# Patient Record
Sex: Male | Born: 1950 | Race: White | Hispanic: No | Marital: Married | Smoking: Former smoker
Health system: Southern US, Community
[De-identification: ages and names within clinical notes are randomized; demographics above are authoritative.]

## PROBLEM LIST (undated history)

## (undated) DIAGNOSIS — H269 Unspecified cataract: Secondary | ICD-10-CM

## (undated) DIAGNOSIS — M1990 Unspecified osteoarthritis, unspecified site: Secondary | ICD-10-CM

## (undated) DIAGNOSIS — E785 Hyperlipidemia, unspecified: Secondary | ICD-10-CM

## (undated) DIAGNOSIS — Z87442 Personal history of urinary calculi: Secondary | ICD-10-CM

## (undated) DIAGNOSIS — E039 Hypothyroidism, unspecified: Secondary | ICD-10-CM

## (undated) DIAGNOSIS — I10 Essential (primary) hypertension: Secondary | ICD-10-CM

## (undated) DIAGNOSIS — K219 Gastro-esophageal reflux disease without esophagitis: Secondary | ICD-10-CM

## (undated) HISTORY — DX: Essential (primary) hypertension: I10

## (undated) HISTORY — DX: Hypothyroidism, unspecified: E03.9

## (undated) HISTORY — DX: Personal history of urinary calculi: Z87.442

## (undated) HISTORY — PX: COLONOSCOPY: SHX174

## (undated) HISTORY — PX: COLONOSCOPY W/ POLYPECTOMY: SHX1380

## (undated) HISTORY — DX: Unspecified cataract: H26.9

## (undated) HISTORY — DX: Gastro-esophageal reflux disease without esophagitis: K21.9

## (undated) HISTORY — PX: CATARACT EXTRACTION: SUR2

## (undated) HISTORY — DX: Hyperlipidemia, unspecified: E78.5

---

## 1982-10-20 HISTORY — PX: BURN TREATMENT: PRO154

## 1998-08-10 ENCOUNTER — Encounter: Payer: Self-pay | Admitting: Emergency Medicine

## 1998-08-10 ENCOUNTER — Emergency Department (HOSPITAL_COMMUNITY): Admission: EM | Admit: 1998-08-10 | Discharge: 1998-08-10 | Payer: Self-pay | Admitting: Emergency Medicine

## 1999-04-14 ENCOUNTER — Encounter: Payer: Self-pay | Admitting: Emergency Medicine

## 1999-04-14 ENCOUNTER — Emergency Department (HOSPITAL_COMMUNITY): Admission: EM | Admit: 1999-04-14 | Discharge: 1999-04-14 | Payer: Self-pay | Admitting: Emergency Medicine

## 1999-07-24 ENCOUNTER — Encounter: Payer: Self-pay | Admitting: Emergency Medicine

## 1999-07-24 ENCOUNTER — Emergency Department (HOSPITAL_COMMUNITY): Admission: EM | Admit: 1999-07-24 | Discharge: 1999-07-24 | Payer: Self-pay | Admitting: Emergency Medicine

## 2000-10-16 ENCOUNTER — Ambulatory Visit (HOSPITAL_COMMUNITY): Admission: RE | Admit: 2000-10-16 | Discharge: 2000-10-16 | Payer: Self-pay | Admitting: *Deleted

## 2004-07-02 ENCOUNTER — Encounter: Payer: Self-pay | Admitting: Internal Medicine

## 2005-05-20 ENCOUNTER — Ambulatory Visit: Payer: Self-pay | Admitting: Internal Medicine

## 2005-05-29 ENCOUNTER — Encounter: Admission: RE | Admit: 2005-05-29 | Discharge: 2005-08-27 | Payer: Self-pay | Admitting: Internal Medicine

## 2005-06-03 ENCOUNTER — Ambulatory Visit: Payer: Self-pay | Admitting: Internal Medicine

## 2005-06-17 ENCOUNTER — Ambulatory Visit: Payer: Self-pay | Admitting: Internal Medicine

## 2005-10-27 ENCOUNTER — Ambulatory Visit: Payer: Self-pay | Admitting: Internal Medicine

## 2006-03-30 ENCOUNTER — Ambulatory Visit: Payer: Self-pay | Admitting: Internal Medicine

## 2006-04-01 ENCOUNTER — Ambulatory Visit: Payer: Self-pay | Admitting: Cardiovascular Disease

## 2006-04-01 IMAGING — CT CT PELVIS W/ CM
2 of 5 series · 17 of 46 positions shown, 19 images · IV contrast (APPLIED)
Comparison: none

CLINICAL DATA: Abdominal pain. 
 ABDOMEN CT WITH CONTRAST:
TECHNIQUE: Multidetector CT imaging of the abdomen was performed following the standard protocol during bolus administration of intravenous contrast.
 Contrast:  125 cc Omnipaque 300.
TECHNIQUE: Multidetector CT imaging of the pelvis was performed following the standard protocol during bolus administration of intravenous contrast.

[Series 2: abd_pel 5.0 b30f st · axial · 0.83mm/px · z∈[-532,-107]mm · 14 of 96 slices shown, 16 images]
[im 6/96  soft-tissue]
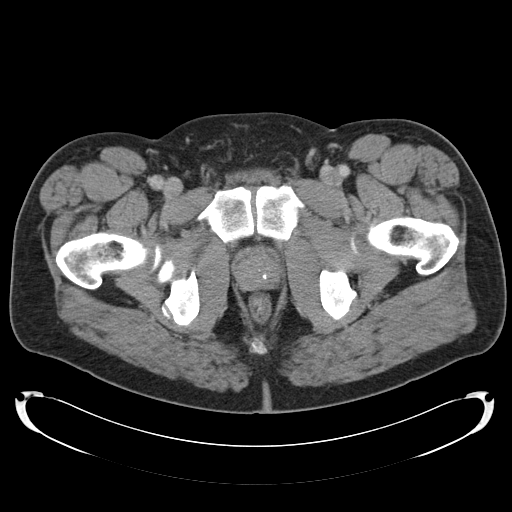
[im 6/96  bone]
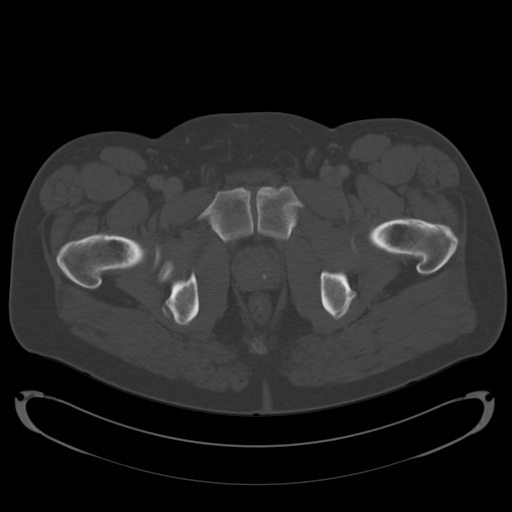
[im 11/96  soft-tissue]
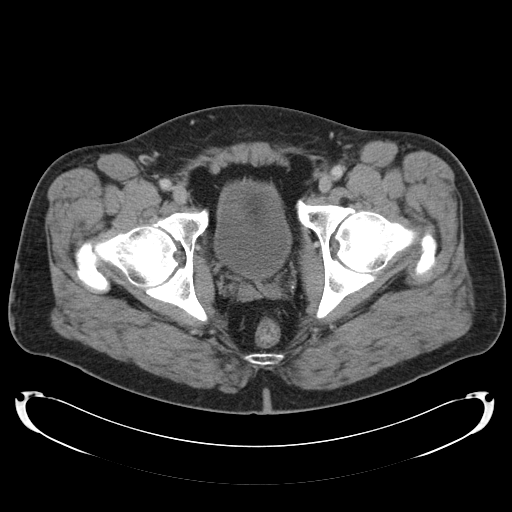
[im 21/96  soft-tissue]
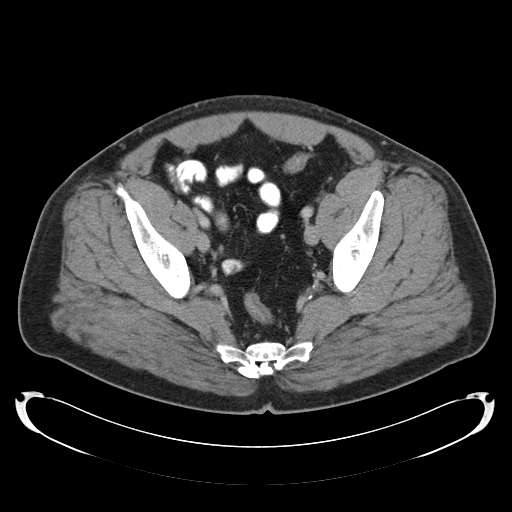
[im 26/96  soft-tissue]
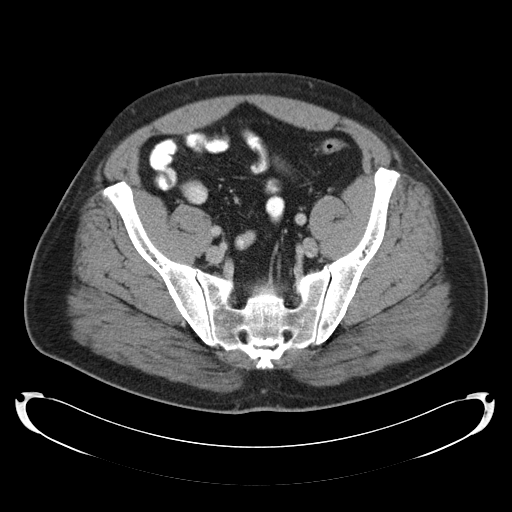
[im 31/96  soft-tissue]
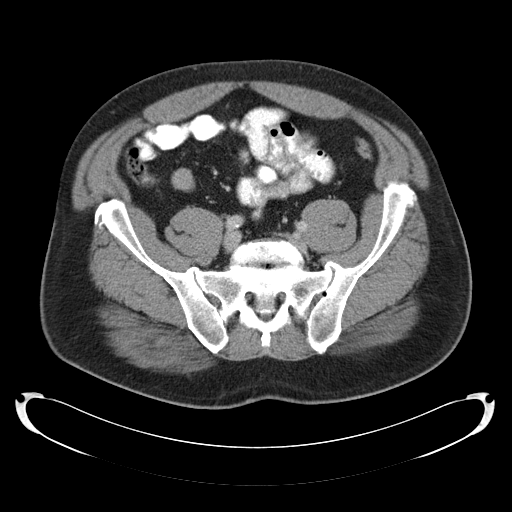
[im 41/96  soft-tissue]
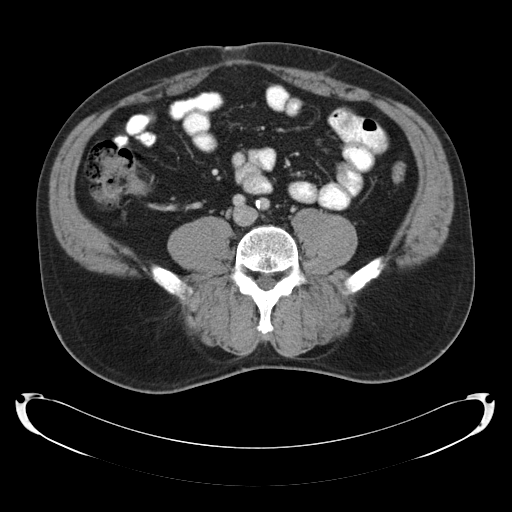
[im 46/96  soft-tissue]
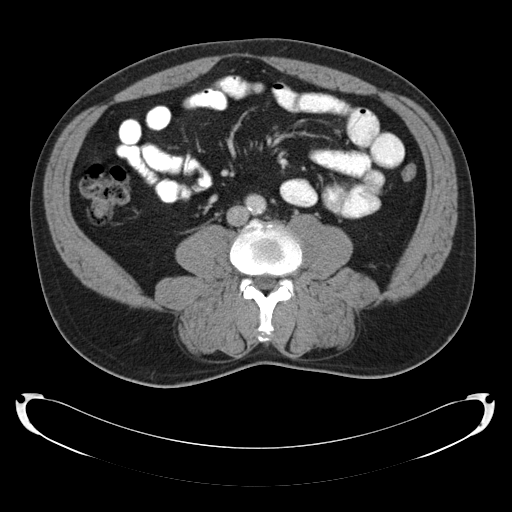
[im 51/96  soft-tissue]
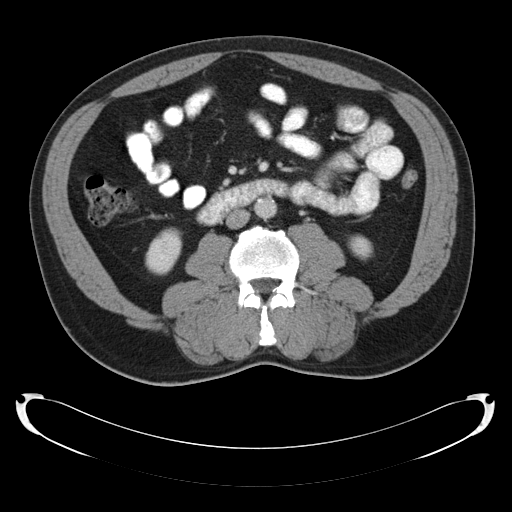
[im 56/96  soft-tissue]
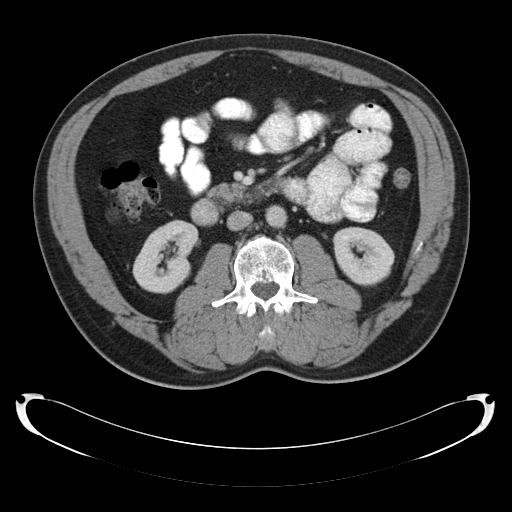
[im 56/96  bone]
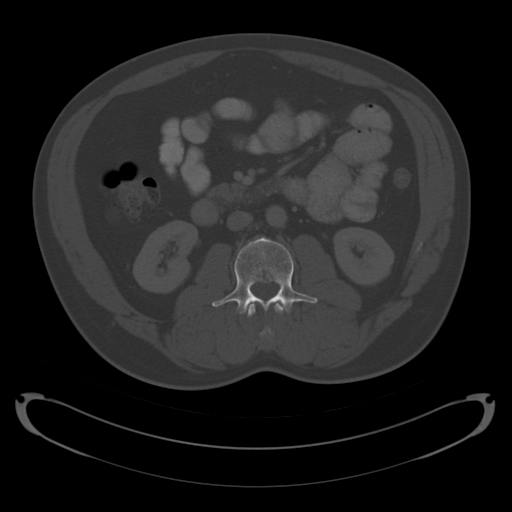
[im 66/96  soft-tissue]
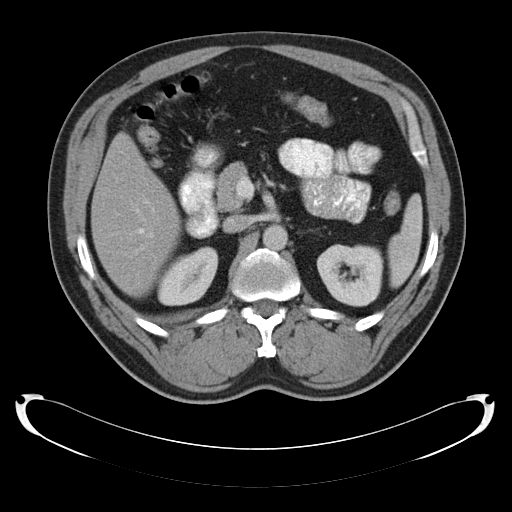
[im 71/96  soft-tissue]
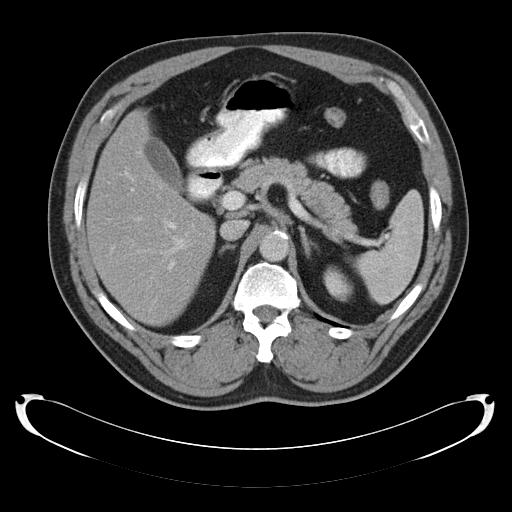
[im 76/96  soft-tissue]
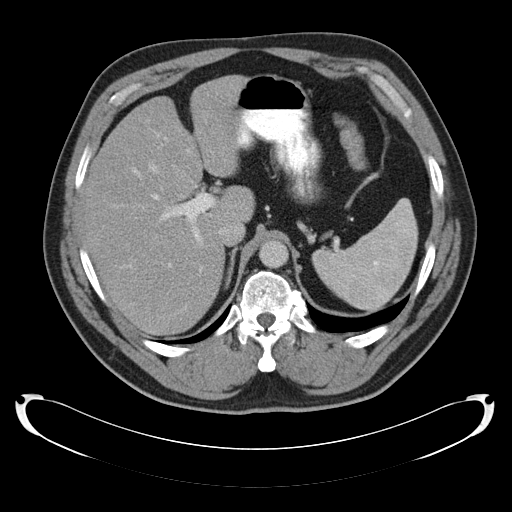
[im 86/96  soft-tissue]
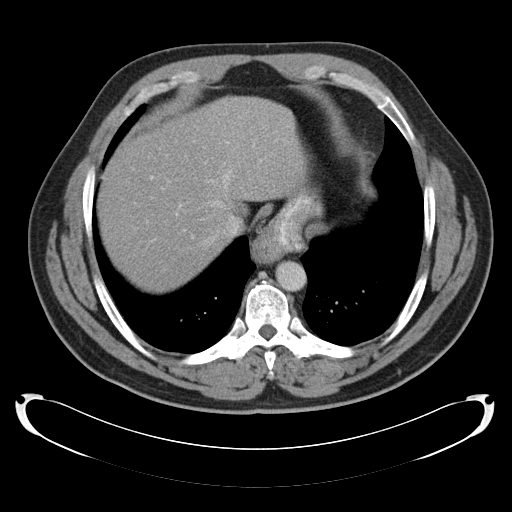
[im 91/96  soft-tissue]
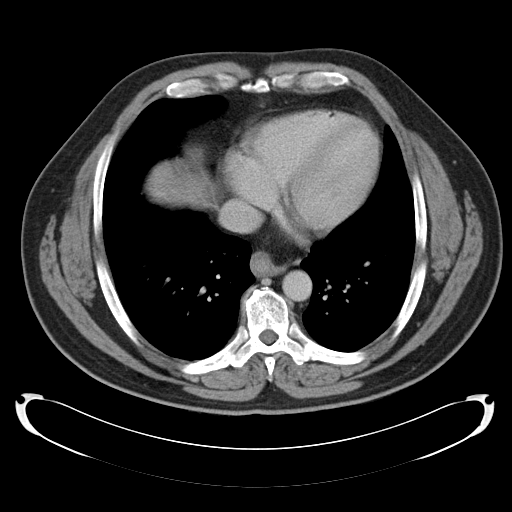

[Series 602: coronal abd · coronal · 0.97mm/px · 3 of 59 slices shown]
[im 20/59  soft-tissue]
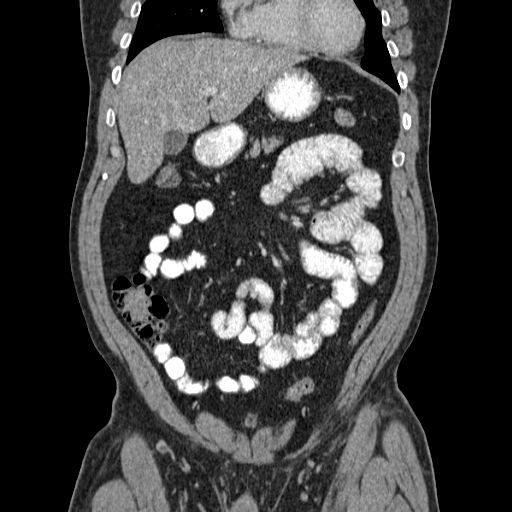
[im 26/59  soft-tissue]
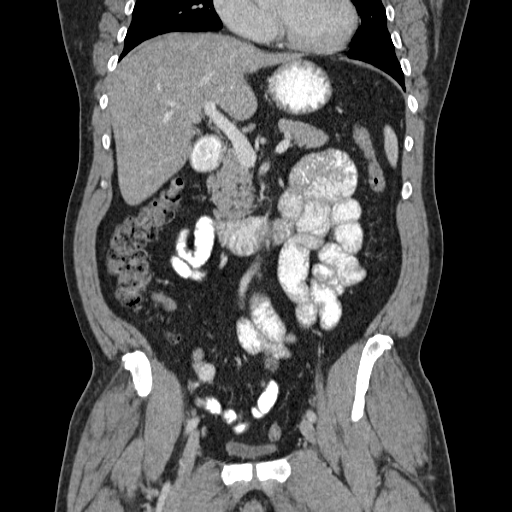
[im 33/59  soft-tissue]
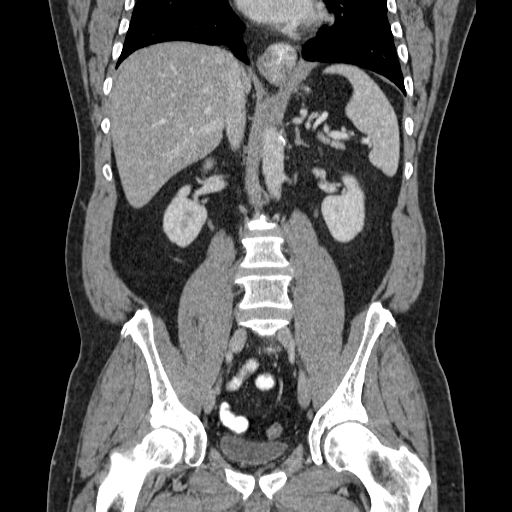

[17 of 46 positions shown; findings below may reference images not displayed]

FINDINGS: The lung bases are clear. 
 There are no pleural or pericardial effusions.  The liver is normal in attenuation and morphology.  The spleen is negative.  The pancreas is normal.  The adrenal glands are normal.  The left kidney is negative.   The right kidney is negative.  The appendix is normal in appearance. The visualized bowel loops are all normal and coarse in caliber.   No abnormal fluid collections are identified within the abdomen.   There is no free fluid.   Review of the visualized osseous structures is unremarkable.
IMPRESSION: Normal abdominal CT.  
 PELVIS CT WITH CONTRAST:
FINDINGS: The urinary bladder is negative. No pathologic lymphadenopathy.  The visualized pelvic bowel loops are negative.   The urinary bladder is negative.  No free pelvic fluid.     There are no abnormal pelvic fluid collections. 
 The review of the visualized osseous structures is negative.  
 No lytic or sclerotic lesions are seen on the visualized osseous structures.
IMPRESSION: Essentially normal pelvic CT.

## 2006-04-21 ENCOUNTER — Ambulatory Visit: Payer: Self-pay | Admitting: Internal Medicine

## 2007-06-16 ENCOUNTER — Ambulatory Visit: Payer: Self-pay | Admitting: Internal Medicine

## 2007-06-16 LAB — CONVERTED CEMR LAB: TSH: 0.66 microintl units/mL (ref 0.35–5.50)

## 2007-06-17 ENCOUNTER — Encounter (INDEPENDENT_AMBULATORY_CARE_PROVIDER_SITE_OTHER): Payer: Self-pay | Admitting: *Deleted

## 2007-07-05 ENCOUNTER — Ambulatory Visit: Payer: Self-pay | Admitting: Internal Medicine

## 2007-07-05 DIAGNOSIS — E039 Hypothyroidism, unspecified: Secondary | ICD-10-CM | POA: Insufficient documentation

## 2007-07-05 LAB — CONVERTED CEMR LAB
Cholesterol, target level: 200 mg/dL
HDL goal, serum: 40 mg/dL
LDL Goal: 160 mg/dL

## 2007-12-31 ENCOUNTER — Telehealth (INDEPENDENT_AMBULATORY_CARE_PROVIDER_SITE_OTHER): Payer: Self-pay | Admitting: *Deleted

## 2008-04-17 ENCOUNTER — Telehealth (INDEPENDENT_AMBULATORY_CARE_PROVIDER_SITE_OTHER): Payer: Self-pay | Admitting: *Deleted

## 2008-06-28 ENCOUNTER — Telehealth (INDEPENDENT_AMBULATORY_CARE_PROVIDER_SITE_OTHER): Payer: Self-pay | Admitting: *Deleted

## 2008-07-05 ENCOUNTER — Ambulatory Visit: Payer: Self-pay | Admitting: Internal Medicine

## 2008-07-07 ENCOUNTER — Encounter (INDEPENDENT_AMBULATORY_CARE_PROVIDER_SITE_OTHER): Payer: Self-pay | Admitting: *Deleted

## 2008-07-07 LAB — CONVERTED CEMR LAB
Albumin: 4.1 g/dL (ref 3.5–5.2)
Alkaline Phosphatase: 52 units/L (ref 39–117)
BUN: 16 mg/dL (ref 6–23)
GFR calc Af Amer: 81 mL/min
GFR calc non Af Amer: 67 mL/min
HDL: 41.5 mg/dL (ref >39.0)
PSA: 0.49 ng/mL (ref 0.10–4.00)
Potassium: 4.4 meq/L (ref 3.5–5.1)
Sodium: 143 meq/L (ref 135–145)
TSH: 1.31 microintl units/mL (ref 0.35–5.50)
Total Bilirubin: 1 mg/dL (ref 0.3–1.2)
Total CHOL/HDL Ratio: 4.9
VLDL: 19 mg/dL (ref 0–40)

## 2008-07-13 ENCOUNTER — Ambulatory Visit: Payer: Self-pay | Admitting: Internal Medicine

## 2008-07-13 DIAGNOSIS — Z860100 Personal history of colon polyps, unspecified: Secondary | ICD-10-CM | POA: Insufficient documentation

## 2008-07-13 DIAGNOSIS — E785 Hyperlipidemia, unspecified: Secondary | ICD-10-CM | POA: Insufficient documentation

## 2008-07-13 DIAGNOSIS — Z87442 Personal history of urinary calculi: Secondary | ICD-10-CM | POA: Insufficient documentation

## 2008-07-13 DIAGNOSIS — I10 Essential (primary) hypertension: Secondary | ICD-10-CM | POA: Insufficient documentation

## 2008-07-13 DIAGNOSIS — E349 Endocrine disorder, unspecified: Secondary | ICD-10-CM | POA: Insufficient documentation

## 2008-07-13 DIAGNOSIS — E7800 Pure hypercholesterolemia, unspecified: Secondary | ICD-10-CM | POA: Insufficient documentation

## 2008-07-13 DIAGNOSIS — F528 Other sexual dysfunction not due to a substance or known physiological condition: Secondary | ICD-10-CM | POA: Insufficient documentation

## 2008-07-13 LAB — CONVERTED CEMR LAB: LDL Goal: 130 mg/dL

## 2008-08-31 ENCOUNTER — Telehealth (INDEPENDENT_AMBULATORY_CARE_PROVIDER_SITE_OTHER): Payer: Self-pay | Admitting: *Deleted

## 2008-09-04 ENCOUNTER — Telehealth (INDEPENDENT_AMBULATORY_CARE_PROVIDER_SITE_OTHER): Payer: Self-pay | Admitting: *Deleted

## 2008-10-10 ENCOUNTER — Ambulatory Visit: Payer: Self-pay | Admitting: Internal Medicine

## 2008-10-10 DIAGNOSIS — T887XXA Unspecified adverse effect of drug or medicament, initial encounter: Secondary | ICD-10-CM | POA: Insufficient documentation

## 2008-10-10 LAB — CONVERTED CEMR LAB
AST: 28 units/L (ref 0–37)
Albumin: 4.2 g/dL (ref 3.5–5.2)
Alkaline Phosphatase: 52 units/L (ref 39–117)
Total Bilirubin: 1.1 mg/dL (ref 0.3–1.2)
Total CHOL/HDL Ratio: 5.1
Triglycerides: 194 mg/dL — ABNORMAL HIGH (ref 0–149)

## 2008-10-16 ENCOUNTER — Ambulatory Visit: Payer: Self-pay | Admitting: Internal Medicine

## 2008-12-28 ENCOUNTER — Telehealth (INDEPENDENT_AMBULATORY_CARE_PROVIDER_SITE_OTHER): Payer: Self-pay | Admitting: *Deleted

## 2009-02-28 ENCOUNTER — Telehealth (INDEPENDENT_AMBULATORY_CARE_PROVIDER_SITE_OTHER): Payer: Self-pay | Admitting: *Deleted

## 2009-05-04 ENCOUNTER — Telehealth (INDEPENDENT_AMBULATORY_CARE_PROVIDER_SITE_OTHER): Payer: Self-pay | Admitting: *Deleted

## 2009-06-04 ENCOUNTER — Telehealth (INDEPENDENT_AMBULATORY_CARE_PROVIDER_SITE_OTHER): Payer: Self-pay | Admitting: *Deleted

## 2009-07-02 ENCOUNTER — Telehealth: Payer: Self-pay | Admitting: Internal Medicine

## 2009-07-17 ENCOUNTER — Ambulatory Visit: Payer: Self-pay | Admitting: Internal Medicine

## 2009-07-17 LAB — CONVERTED CEMR LAB
PSA: 0.54 ng/mL (ref 0.10–4.00)
Potassium: 4.2 meq/L (ref 3.5–5.1)

## 2009-07-25 ENCOUNTER — Ambulatory Visit: Payer: Self-pay | Admitting: Internal Medicine

## 2009-11-14 ENCOUNTER — Emergency Department (HOSPITAL_COMMUNITY): Admission: EM | Admit: 2009-11-14 | Discharge: 2009-11-14 | Payer: Self-pay | Admitting: Family Medicine

## 2009-12-24 ENCOUNTER — Telehealth (INDEPENDENT_AMBULATORY_CARE_PROVIDER_SITE_OTHER): Payer: Self-pay | Admitting: *Deleted

## 2010-04-18 ENCOUNTER — Telehealth (INDEPENDENT_AMBULATORY_CARE_PROVIDER_SITE_OTHER): Payer: Self-pay | Admitting: *Deleted

## 2010-06-06 ENCOUNTER — Telehealth: Payer: Self-pay | Admitting: Internal Medicine

## 2010-07-01 ENCOUNTER — Encounter (INDEPENDENT_AMBULATORY_CARE_PROVIDER_SITE_OTHER): Payer: Self-pay | Admitting: *Deleted

## 2010-07-23 ENCOUNTER — Telehealth (INDEPENDENT_AMBULATORY_CARE_PROVIDER_SITE_OTHER): Payer: Self-pay | Admitting: *Deleted

## 2010-07-24 ENCOUNTER — Ambulatory Visit: Payer: Self-pay | Admitting: Internal Medicine

## 2010-07-24 LAB — CONVERTED CEMR LAB
Bilirubin Urine: NEGATIVE
Ketones, urine, test strip: NEGATIVE
Specific Gravity, Urine: <1.005

## 2010-07-25 LAB — CONVERTED CEMR LAB
ALT: 29 units/L (ref 0–53)
AST: 29 units/L (ref 0–37)
Albumin: 4.4 g/dL (ref 3.5–5.2)
BUN: 19 mg/dL (ref 6–23)
Basophils Absolute: 0 10*3/uL (ref 0.0–0.1)
Cholesterol: 226 mg/dL — ABNORMAL HIGH (ref 0–200)
Creatinine, Ser: 1.2 mg/dL (ref 0.4–1.5)
Eosinophils Relative: 4.2 % (ref 0.0–5.0)
GFR calc non Af Amer: 66.11 mL/min (ref >60)
MCV: 95 fL (ref 78.0–100.0)
Monocytes Absolute: 0.5 10*3/uL (ref 0.1–1.0)
Monocytes Relative: 6.7 % (ref 3.0–12.0)
Neutrophils Relative %: 54.2 % (ref 43.0–77.0)
PSA: 0.49 ng/mL (ref 0.10–4.00)
Platelets: 259 10*3/uL (ref 150.0–400.0)
TSH: 1.23 microintl units/mL (ref 0.35–5.50)
Testosterone: 117.76 ng/dL — ABNORMAL LOW (ref 350.00–890.00)
Triglycerides: 231 mg/dL — ABNORMAL HIGH (ref 0.0–149.0)
WBC: 7.6 10*3/uL (ref 4.5–10.5)

## 2010-07-29 ENCOUNTER — Ambulatory Visit: Payer: Self-pay | Admitting: Internal Medicine

## 2010-07-29 ENCOUNTER — Encounter: Payer: Self-pay | Admitting: Internal Medicine

## 2010-07-29 DIAGNOSIS — L255 Unspecified contact dermatitis due to plants, except food: Secondary | ICD-10-CM | POA: Insufficient documentation

## 2010-11-19 NOTE — Progress Notes (Signed)
Summary: refill  Phone Note Refill Request Message from:  Fax from Pharmacy on December 24, 2009 12:46 PM  Refills Requested: Medication #1:  ANDROGEL PUMP 1 %  GEL apply daily- [facility] fax [phone]   Method Requested: Fax to Local Pharmacy Next Appointment Scheduled: no appt Initial call taken by: Barb Merino,  December 24, 2009 12:47 PM    Prescriptions: ANDROGEL PUMP 1 %  GEL (TESTOSTERONE) apply daily-  #18mo x 5   Entered by:   Shonna Chock   Authorized by:   Marga Melnick MD   Signed by:   Shonna Chock on 12/24/2009   Method used:   Printed then faxed to ...       [facility] [address] [id number]       [address]       [place], [place]  [address]       Ph: [phone]       Fax: [phone]   RxID:   [id number]
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
## 2010-11-19 NOTE — Letter (Signed)
Summary: Primary Care Appointment Letter  [facility]  [address] [place], [place] [address]   Phone: [phone]  Fax: [phone]    07/01/2010 MRN: [id number]  [facility] [address] [place], [place]  [address]  Dear Mr. Borromeo,   Your Primary Care Physician Marga Melnick MD has indicated that:    ____X___it is time to schedule an appointment FOR CPX AND FASTING LABS BEFORE FURTHER REFILLS CAN BE GIVEN.    _______you missed your appointment on______ and need to call and          reschedule.    _______you need to have lab work done.    _______you need to schedule an appointment discuss lab or test results.    _______you need to call to reschedule your appointment that is                       scheduled on _________.     Please call our office as soon as possible. Our phone number is [phone]_____. Our office is open 8a-5p, Monday through Friday.     Thank you,     Primary Care Scheduler

## 2010-11-19 NOTE — Progress Notes (Signed)
Summary: refill  Phone Note Refill Request Message from:  Fax from Pharmacy on June 06, 2010 11:06 AM  Refills Requested: Medication #1:  TESTIM 1 % GEL use 5 grams daily [facility] [address] - fax [phone]  Initial call taken by: Okey Regal Spring,  June 06, 2010 11:07 AM  Follow-up for Phone Call        Patient was last seen 10.2010. Please advise. Lucious Groves CMA  June 06, 2010 11:34 AM     Prescriptions: TESTIM 1 % GEL (TESTOSTERONE) use 5 grams daily  #1 month x 2   Entered and Authorized by:   Marga Melnick MD   Signed by:   Marga Melnick MD on 06/07/2010   Method used:   Printed then faxed to ...       [facility] [address] [id number]       [address]       [place], [place]  [address]       Ph: [phone]       Fax: [phone]   RxID:   [id number]

## 2010-11-19 NOTE — Progress Notes (Signed)
Summary: lab  Phone Note Call from Patient   Caller: Patient Summary of Call: patient is scheduled for lab 07/24/10 - med refill 07/29/10 --- does he need addl lab This was copied from 07/2009 appointment, labs were due 01/2010 2)  Please schedule a follow-up appointment in 6 months. 3)  Hepatic Panel prior to visit, ICD-9:995.20 4)  Lipid Panel prior to visit, ICD-9:272.4 5)  TSH prior to visit, ICD-9:244.9. Consume < 40 grams  of sugar / day. 6)  HbgA1C prior to visit, ICD-9:790.29. Bring BP cuff to all appts  Initial call taken by: Okey Regal Spring,  July 23, 2010 8:53 AM  Follow-up for Phone Call        PSA/BMP/CBCD/Stool Cards/ udip v70.0 Follow-up by: Shonna Chock CMA,  July 23, 2010 2:51 PM  Additional Follow-up for Phone Call Additional follow up Details #1::        addl lab order given to regina .Marland KitchenOkey Regal Spring  July 24, 2010 8:13 AM

## 2010-11-19 NOTE — Progress Notes (Signed)
Summary: ANDROGEL ALTERNATIVE  Phone Note Call from Patient Call back at Work Phone [phone]   Caller: Patient Summary of Call: PT CALLED AND STATED THAT HE RECEIVED A LETTER FROM [facility] REGARDING HIS RX FOR ANDROGEL. HE STATES THAT THEY ARE GOING TO INCREASE THE PRICE. HE WOULD LIKE TO KNOW IF THERE IS AN ALTERNATIVE RX FOR THIS. PLEASE CONTACT PT AT [phone] Initial call taken by: Lavell Islam,  April 18, 2010 10:41 AM  Follow-up for Phone Call        can change to Testim 5 grams daily.  disp 1 month supply, no refills.  may not be cheaper- would need to check w/ pharmacy/insurance. Follow-up by: Neena Rhymes MD,  April 19, 2010 8:07 AM  Additional Follow-up for Phone Call Additional follow up Details #1::        spoke w/ patient aware medication changed and prescription sent to pharmacy .......Marland KitchenDoristine Devoid  April 19, 2010 3:38 PM     New/Updated Medications: TESTIM 1 % GEL (TESTOSTERONE) use 5 grams daily Prescriptions: TESTIM 1 % GEL (TESTOSTERONE) use 5 grams daily  #1 month x 0   Entered by:   Doristine Devoid   Authorized by:   Neena Rhymes MD   Signed by:   Doristine Devoid on 04/19/2010   Method used:   Printed then faxed to ...       [facility] [address] [id number]       [address]       [place], [place]  [address]       Ph: [phone]       Fax: [phone]   RxID:   [id number]

## 2011-01-06 LAB — CBC
MCHC: 35.3 g/dL (ref 30.0–36.0)
Platelets: 174 10*3/uL (ref 150–400)
RDW: 12.4 % (ref 11.5–15.5)

## 2011-01-06 LAB — POCT I-STAT, CHEM 8
Calcium, Ion: 1.19 mmol/L (ref 1.12–1.32)
Chloride: 108 mEq/L (ref 96–112)
HCT: 54 % — ABNORMAL HIGH (ref 39.0–52.0)
Hemoglobin: 18.4 g/dL — ABNORMAL HIGH (ref 13.0–17.0)
Potassium: 4.3 mEq/L (ref 3.5–5.1)

## 2011-01-06 LAB — DIFFERENTIAL
Basophils Absolute: 0 10*3/uL (ref 0.0–0.1)
Basophils Relative: 0 % (ref 0–1)
Eosinophils Absolute: 0.3 10*3/uL (ref 0.0–0.7)
Neutro Abs: 7.2 10*3/uL (ref 1.7–7.7)
Neutrophils Relative %: 66 % (ref 43–77)

## 2011-01-30 ENCOUNTER — Other Ambulatory Visit: Payer: Self-pay | Admitting: *Deleted

## 2011-01-30 MED ORDER — TESTOSTERONE 50 MG/5GM (1%) TD GEL: 5.0000 g | Freq: Every day | TRANSDERMAL | Status: DC

## 2011-02-03 ENCOUNTER — Other Ambulatory Visit: Payer: Self-pay | Admitting: *Deleted

## 2011-02-03 MED ORDER — TESTOSTERONE 50 MG/5GM (1%) TD GEL: 5.0000 g | Freq: Every day | TRANSDERMAL | Status: DC

## 2011-02-03 NOTE — Telephone Encounter (Signed)
Addended by: Doristine Devoid on: 02/03/2011 11:48 AM   Modules accepted: Orders

## 2011-02-18 ENCOUNTER — Other Ambulatory Visit: Payer: Self-pay | Admitting: *Deleted

## 2011-02-18 MED ORDER — TESTOSTERONE 50 MG/5GM (1%) TD GEL: 5.0000 g | Freq: Every day | TRANSDERMAL | Status: DC

## 2011-08-09 ENCOUNTER — Other Ambulatory Visit: Payer: Self-pay | Admitting: Internal Medicine

## 2011-08-11 NOTE — Telephone Encounter (Signed)
Patient needs to schedule a CPX  

## 2011-09-08 ENCOUNTER — Other Ambulatory Visit: Payer: Self-pay | Admitting: Internal Medicine

## 2011-09-08 MED ORDER — TESTOSTERONE 50 MG/5GM (1%) TD GEL: 5.0000 g | Freq: Every day | TRANSDERMAL | Status: DC

## 2011-09-08 NOTE — Telephone Encounter (Signed)
Patient needs to schedule a CPX  

## 2011-11-13 ENCOUNTER — Other Ambulatory Visit: Payer: Self-pay | Admitting: Internal Medicine

## 2011-11-13 NOTE — Telephone Encounter (Signed)
TSH/244.9, f/u with Hopp 2-3 days later

## 2011-11-14 ENCOUNTER — Other Ambulatory Visit: Payer: Self-pay | Admitting: Internal Medicine

## 2011-11-14 MED ORDER — TESTOSTERONE 50 MG/5GM (1%) TD GEL: 5.0000 g | Freq: Every day | TRANSDERMAL | Status: DC

## 2011-11-14 NOTE — Telephone Encounter (Signed)
Patient needs to schedule a CPX  

## 2011-12-10 ENCOUNTER — Other Ambulatory Visit: Payer: Self-pay | Admitting: Internal Medicine

## 2011-12-10 NOTE — Telephone Encounter (Signed)
Left message on patient's cell informing him he is due for labs. TSH 244.9

## 2011-12-16 ENCOUNTER — Telehealth: Payer: Self-pay | Admitting: Internal Medicine

## 2011-12-16 NOTE — Telephone Encounter (Signed)
Please advise.  This would be a new prescription and he hasn't been seen since 2011

## 2011-12-16 NOTE — Telephone Encounter (Signed)
Cialis20 mg # 3; one half to one every 3 days as needed. I cannot refill any more medications until he has been seen; last office visit was in 2011.

## 2011-12-16 NOTE — Telephone Encounter (Signed)
Patient called & stated he would like a prescription for Cialis [phone] [address] If for some reason he must be seen to get this script please let me know & I will call him back to schedule an appointment Thanks

## 2011-12-17 MED ORDER — TADALAFIL 20 MG PO TABS: ORAL_TABLET | ORAL | Status: DC

## 2011-12-17 NOTE — Telephone Encounter (Signed)
RX was sent. Please contact patient and offer to schedule a CPX for Dr.Hopper stated he will be unable refill anymore medication w/o an appointment

## 2011-12-17 NOTE — Telephone Encounter (Signed)
2/26-called patient back he understands no more meds can be called in until his physical appointment 4/24.

## 2012-01-09 ENCOUNTER — Other Ambulatory Visit: Payer: Self-pay | Admitting: Internal Medicine

## 2012-01-09 NOTE — Telephone Encounter (Signed)
Pt wife called in to say that her husband needs a refill for his thyroid medication per notes that he is out of RX but has an upcoming apt on 02-11-12, contacted MD Hopper's assistance and verified protocol per MD Alwyn Ren that advises per pt does have a pending apt we can send pt a 30 day supply to last til he can get into the office, per noted pt has not been seen since 2011, pt wife understood and will make sure pt keeps his upcoming apt, also reiterated that no more medications can be sent for the pt if he misses the apt until he comes into office to be seen, pt wife understood, rx sent to pharmacy by [facility]script [facility] [address] per pt wife request for synthroid 175mg 

## 2012-02-03 ENCOUNTER — Other Ambulatory Visit: Payer: Self-pay | Admitting: Internal Medicine

## 2012-02-03 DIAGNOSIS — R7309 Other abnormal glucose: Secondary | ICD-10-CM

## 2012-02-03 DIAGNOSIS — T887XXA Unspecified adverse effect of drug or medicament, initial encounter: Secondary | ICD-10-CM

## 2012-02-03 DIAGNOSIS — E785 Hyperlipidemia, unspecified: Secondary | ICD-10-CM

## 2012-02-03 DIAGNOSIS — E039 Hypothyroidism, unspecified: Secondary | ICD-10-CM

## 2012-02-03 DIAGNOSIS — I10 Essential (primary) hypertension: Secondary | ICD-10-CM

## 2012-02-04 ENCOUNTER — Other Ambulatory Visit (INDEPENDENT_AMBULATORY_CARE_PROVIDER_SITE_OTHER): Payer: BC Managed Care – PPO

## 2012-02-04 DIAGNOSIS — E039 Hypothyroidism, unspecified: Secondary | ICD-10-CM

## 2012-02-04 DIAGNOSIS — R7309 Other abnormal glucose: Secondary | ICD-10-CM

## 2012-02-04 DIAGNOSIS — E785 Hyperlipidemia, unspecified: Secondary | ICD-10-CM

## 2012-02-04 DIAGNOSIS — T887XXA Unspecified adverse effect of drug or medicament, initial encounter: Secondary | ICD-10-CM

## 2012-02-04 DIAGNOSIS — I10 Essential (primary) hypertension: Secondary | ICD-10-CM

## 2012-02-04 LAB — CBC WITH DIFFERENTIAL/PLATELET
Eosinophils Relative: 5 % (ref 0.0–5.0)
HCT: 44 % (ref 39.0–52.0)
Hemoglobin: 15.1 g/dL (ref 13.0–17.0)
Lymphocytes Relative: 32.7 % (ref 12.0–46.0)
Lymphs Abs: 2.2 10*3/uL (ref 0.7–4.0)
Monocytes Relative: 8.2 % (ref 3.0–12.0)
Platelets: 248 10*3/uL (ref 150.0–400.0)
WBC: 6.9 10*3/uL (ref 4.5–10.5)

## 2012-02-04 LAB — BASIC METABOLIC PANEL
Chloride: 103 mEq/L (ref 96–112)
Creatinine, Ser: 1.2 mg/dL (ref 0.4–1.5)
Potassium: 4.2 mEq/L (ref 3.5–5.1)

## 2012-02-04 LAB — HEPATIC FUNCTION PANEL
Albumin: 4.4 g/dL (ref 3.5–5.2)
Total Protein: 7.8 g/dL (ref 6.0–8.3)

## 2012-02-04 LAB — TSH: TSH: 0.66 u[IU]/mL (ref 0.35–5.50)

## 2012-02-04 LAB — LDL CHOLESTEROL, DIRECT: Direct LDL: 137.7 mg/dL

## 2012-02-04 LAB — LIPID PANEL
Cholesterol: 209 mg/dL — ABNORMAL HIGH (ref 0–200)
HDL: 39.9 mg/dL (ref >39.00)
Triglycerides: 165 mg/dL — ABNORMAL HIGH (ref 0.0–149.0)
VLDL: 33 mg/dL (ref 0.0–40.0)

## 2012-02-04 LAB — HEMOGLOBIN A1C: Hgb A1c MFr Bld: 5.4 % (ref 4.6–6.5)

## 2012-02-11 ENCOUNTER — Ambulatory Visit (INDEPENDENT_AMBULATORY_CARE_PROVIDER_SITE_OTHER): Payer: BC Managed Care – PPO | Admitting: Internal Medicine

## 2012-02-11 ENCOUNTER — Encounter: Payer: Self-pay | Admitting: Internal Medicine

## 2012-02-11 VITALS — HR 72 | Temp 98.5°F | Resp 12 | Ht 71.0 in | Wt 264.6 lb

## 2012-02-11 DIAGNOSIS — E785 Hyperlipidemia, unspecified: Secondary | ICD-10-CM

## 2012-02-11 DIAGNOSIS — Z23 Encounter for immunization: Secondary | ICD-10-CM

## 2012-02-11 DIAGNOSIS — E291 Testicular hypofunction: Secondary | ICD-10-CM

## 2012-02-11 DIAGNOSIS — Z0000 Encounter for general adult medical examination without abnormal findings: Secondary | ICD-10-CM

## 2012-02-11 MED ORDER — LEVOTHYROXINE SODIUM 175 MCG PO TABS: ORAL_TABLET | ORAL | Status: DC

## 2012-02-11 MED ORDER — TADALAFIL 20 MG PO TABS: ORAL_TABLET | ORAL | Status: DC

## 2012-02-11 MED ORDER — TESTOSTERONE 50 MG/5GM (1%) TD GEL: 5.0000 g | Freq: Every day | TRANSDERMAL | Status: DC

## 2012-02-11 NOTE — Progress Notes (Signed)
Addended by: Maurice Small on: 02/11/2012 01:58 PM   Modules accepted: Orders

## 2012-02-11 NOTE — Patient Instructions (Addendum)
Preventive Health Care: Exercise at least 30-45 minutes a day,  3-4 days a week.   Eye Doctor - have an eye exam @ least annually.                                                         Health Care Power of Attorney & Living Will. Complete if not in place ; these place you in charge of your health care decisions. The most common cause of elevated triglycerides is the ingestion of sugar from high fructose corn syrup sources added to processed foods & drinks.  Eat a low-fat diet with lots of fruits and vegetables, up to 7-9 servings per day. Consume less than 40 (preferably ZERO) grams of sugar per day from foods & drinks with High Fructose Corn Syrup (HFCS) sugar as #1,2,3 or # 4 on label.Whole Foods, [employer] & [employer] do not carry products with HFCS. Follow a  low carb nutrition program such as [place] or The New Sugar Busters  to prevent Diabetes progression . White carbohydrates (potatoes, rice, bread, and pasta) have a high spike of sugar and a high load of sugar. For example a  baked potato has a cup of sugar and a  french fry  2 teaspoons of sugar. Yams, wild  rice, whole grained bread &  wheat pasta have been much lower spike and load of  sugar. Portions should be the size of a deck of cards or your palm.   Please  schedule fasting Labs  in 4-6 months:Lipids,  TSH, PSA. PLEASE BRING THESE INSTRUCTIONS TO FOLLOW UP  LAB APPOINTMENT.This will guarantee correct labs are drawn, eliminating need for repeat blood sampling ( needle sticks ! ). Diagnoses /Codes: 272.4, testosterone dficiency, 244.9

## 2012-02-11 NOTE — Progress Notes (Signed)
  Subjective:    Patient ID: Charles [name], male    DOB: 20-Jan-1951, 61 y.o.   MRN: [id number]  HPI  Charles [name] is here for a physical; he denies any acute issues.       Review of Systems Patient reports no  vision/ hearing changes,anorexia,  fever ,adenopathy, persistant / recurrent hoarseness, swallowing issues, chest pain,palpitations, edema,persistant / recurrent cough, hemoptysis, dyspnea(rest, exertional, paroxysmal nocturnal), gastrointestinal  bleeding (melena, rectal bleeding), abdominal pain, excessive heart burn, GU symptoms( dysuria, hematuria, pyuria, voiding/incontinence  issues) syncope, focal weakness, memory loss,numbness & tingling, skin/hair/nail changes,depression, anxiety, abnormal bruising/bleeding, musculoskeletal symptoms/signs.   He has had a 17-20 pound weight gain in the past year.     Objective:   Physical Exam Gen.:  well-nourished in appearance. Alert, appropriate and cooperative throughout exam. Head: Normocephalic without obvious abnormalities; goatee Eyes: No corneal or conjunctival inflammation noted. Pupils equal round reactive to light and accommodation. Fundal exam is benign without hemorrhages, exudate, papilledema. Extraocular motion intact. Vision grossly normal. Ears: External  ear exam reveals no significant lesions or deformities. Canals clear .TMs normal. Hearing is grossly normal bilaterally. Nose: External nasal exam reveals no deformity or inflammation. Nasal mucosa are pink and moist. No lesions or exudates noted.   Mouth: Oral mucosa and oropharynx reveal no lesions or exudates. Upper plate ; lower partial. Neck: No deformities, masses, or tenderness noted. Range of motion & Thyroid normal. Lungs: Normal respiratory effort; chest expands symmetrically. Lungs are clear to auscultation without rales, wheezes, or increased work of breathing. Heart: Normal rate and rhythm. Normal S1 and S2. No gallop, click, or rub. S4 w/o murmur. Abdomen:  Bowel sounds normal; abdomen soft and nontender. No masses, organomegaly . Umbilical  hernia noted. Genitalia/ DRE:    Genital exam is unremarkable. Prostate is small with no asymmetry, induration, or nodularity           Musculoskeletal/extremities: No deformity or scoliosis noted of  the thoracic or lumbar spine. No clubbing, cyanosis, edema, or deformity noted. Range of motion  normal .Tone & strength  normal.Joints normal. Nail health  good. Vascular: Carotid, radial artery, dorsalis pedis and  posterior tibial pulses are full and equal. No bruits present. Neurologic: Alert and oriented x3. Deep tendon reflexes symmetrical and normal.          Skin: Intact without suspicious lesions or rashes. Lymph: No cervical, axillary, or inguinal lymphadenopathy present. Psych: Mood and affect are normal. Normally interactive                                                                                         Assessment & Plan:  #1 comprehensive physical exam; no acute findings #2 see Problem List with Assessments & Recommendations Plan: see Orders

## 2012-09-13 ENCOUNTER — Other Ambulatory Visit: Payer: Self-pay | Admitting: Internal Medicine

## 2012-09-13 DIAGNOSIS — E349 Endocrine disorder, unspecified: Secondary | ICD-10-CM

## 2012-09-13 DIAGNOSIS — T887XXA Unspecified adverse effect of drug or medicament, initial encounter: Secondary | ICD-10-CM

## 2012-09-14 NOTE — Telephone Encounter (Signed)
Per Dr.Hopper lab work for PSA must be completed first before medication to be filled. I spoke with patient's wife and she verbalized understanding that her husband needs to call back to schedule fasting labs appointment. Order placed

## 2012-09-27 ENCOUNTER — Other Ambulatory Visit (INDEPENDENT_AMBULATORY_CARE_PROVIDER_SITE_OTHER): Payer: BC Managed Care – PPO

## 2012-09-27 DIAGNOSIS — E349 Endocrine disorder, unspecified: Secondary | ICD-10-CM

## 2012-09-27 DIAGNOSIS — E291 Testicular hypofunction: Secondary | ICD-10-CM

## 2012-09-27 DIAGNOSIS — E039 Hypothyroidism, unspecified: Secondary | ICD-10-CM

## 2012-09-27 DIAGNOSIS — E785 Hyperlipidemia, unspecified: Secondary | ICD-10-CM

## 2012-09-27 DIAGNOSIS — T887XXA Unspecified adverse effect of drug or medicament, initial encounter: Secondary | ICD-10-CM

## 2012-09-27 LAB — LDL CHOLESTEROL, DIRECT: Direct LDL: 161.1 mg/dL

## 2012-09-27 LAB — LIPID PANEL
Total CHOL/HDL Ratio: 6
Triglycerides: 182 mg/dL — ABNORMAL HIGH (ref 0.0–149.0)

## 2012-09-27 LAB — HEMOGLOBIN A1C: Hgb A1c MFr Bld: 5.9 % (ref 4.6–6.5)

## 2012-09-27 LAB — PSA: PSA: 0.27 ng/mL (ref 0.10–4.00)

## 2012-09-27 NOTE — Addendum Note (Signed)
Addended by: Silvio Pate D on: 09/27/2012 02:23 PM   Modules accepted: Orders

## 2012-10-05 ENCOUNTER — Encounter: Payer: Self-pay | Admitting: Internal Medicine

## 2012-10-05 ENCOUNTER — Ambulatory Visit (INDEPENDENT_AMBULATORY_CARE_PROVIDER_SITE_OTHER): Payer: BC Managed Care – PPO | Admitting: Internal Medicine

## 2012-10-05 VITALS — BP 124/90 | HR 71 | Wt 264.6 lb

## 2012-10-05 DIAGNOSIS — E785 Hyperlipidemia, unspecified: Secondary | ICD-10-CM

## 2012-10-05 DIAGNOSIS — I10 Essential (primary) hypertension: Secondary | ICD-10-CM

## 2012-10-05 DIAGNOSIS — E291 Testicular hypofunction: Secondary | ICD-10-CM

## 2012-10-05 DIAGNOSIS — E039 Hypothyroidism, unspecified: Secondary | ICD-10-CM

## 2012-10-05 MED ORDER — TESTOSTERONE 50 MG/5GM (1%) TD GEL: 5.0000 g | Freq: Every day | TRANSDERMAL | Status: DC

## 2012-10-05 MED ORDER — LEVOTHYROXINE SODIUM 200 MCG PO TABS: 200.0000 ug | ORAL_TABLET | Freq: Every day | ORAL | Status: DC

## 2012-10-05 NOTE — Assessment & Plan Note (Signed)
Blood pressure goals discussed; renal function will be checked at followup

## 2012-10-05 NOTE — Assessment & Plan Note (Signed)
Thyroid supplement will be increased to 200 mcg; repeat TSH in 10-[redacted] weeks along with lipids

## 2012-10-05 NOTE — Progress Notes (Signed)
  Subjective:    Patient ID: Charles [name], male    DOB: 1951/06/03, 61 y.o.   MRN: [id number]  HPI He returns to to review his lipids, TSH and discuss refill of the Androgel.  He states that he has started drinking soft drinks again and does eat sweets. He is on no specific diet. He's very physically active on his job.  Despite no change in his thyroid medication; his TSH is now sub therapeutic at 5.78.  Additionally triglycerides have increased to 182 and his LDL or bad cholesterol to 657.8. Based on advanced cholesterol testing his LDL goal is less than 100, ideally less than 70.  His sister recently had a heart attack at age 46; is no other family history of coronary disease.  His PSA is ideal at 0.27. The possible increased cardiovascular risk for testosterone replacement with uncontrolled lipids was discussed.    Review of Systems  As stated he is very physically active in his job. He denies associated chest pain, palpitations, dyspnea, edema, or claudication.     Objective:   Physical Exam  General appearance is one of good health and nourishment w/o distress.  Eyes: No conjunctival inflammation or scleral icterus is present.  Thyroid normal  Oral exam: Upper plate & lower partial; lips and gums are healthy appearing.There is no oropharyngeal erythema or exudate noted.   Heart:  Normal rate and regular rhythm. S1 and S2 normal without gallop,  click, rub or other extra sounds .Grade 1/2 over 6 systolic murmur    Lungs:Chest clear to auscultation; no wheezes, rhonchi,rales ,or rubs present.No increased work of breathing.   Abdomen: bowel sounds normal, soft and non-tender without masses,or  organomegaly . Umbilical  hernia noted.  No guarding or rebound   Skin:Warm & dry.  Intact without suspicious lesions or rashes ; no jaundice   Lymphatic: No lymphadenopathy is noted about the head, neck, axilla  DTRs = & normal  Osteoarthritic finger changes             Assessment & Plan:

## 2012-10-05 NOTE — Patient Instructions (Addendum)
Blood Pressure Goal  Ideally is an AVERAGE < 135/85. This AVERAGE should be calculated from @ least 5-7 BP readings taken @ different times of day on different days of week. You should not respond to isolated BP readings , but rather the AVERAGE for that week. Please take enteric-coated aspirin 81 mg daily with breakfast.   The most common cause of elevated triglycerides is the ingestion of sugar from high fructose corn syrup sources added to processed foods & drinks.  Eat a low-fat diet with lots of fruits and vegetables, up to 7-9 servings per day. Consume less than 40 (preferably ZERO) grams of sugar per day from foods & drinks with High Fructose Corn Syrup (HFCS) sugar as #1,2,3 or # 4 on label.Whole Foods, [employer] & [employer] do not carry products with HFCS. Please  schedule fasting Labs in 10-12 weeks : Testosterone level,Lipids, TSH. PLEASE BRING THESE INSTRUCTIONS TO FOLLOW UP  LAB APPOINTMENT.This will guarantee correct labs are drawn, eliminating need for repeat blood sampling ( needle sticks ! ). 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  If you activate My Chart; the results can be released to you as soon as they populate from the lab. If you choose not to use this program; the labs have to be reviewed, copied & mailed   causing a delay in getting the results to you.

## 2012-10-05 NOTE — Assessment & Plan Note (Signed)
Potential cardiovascular risk of testosterone replacement with uncontrolled lipids was discussed

## 2012-11-17 ENCOUNTER — Other Ambulatory Visit: Payer: Self-pay | Admitting: Internal Medicine

## 2012-11-17 DIAGNOSIS — E291 Testicular hypofunction: Secondary | ICD-10-CM

## 2012-11-18 ENCOUNTER — Other Ambulatory Visit: Payer: BC Managed Care – PPO

## 2012-11-18 DIAGNOSIS — E291 Testicular hypofunction: Secondary | ICD-10-CM

## 2012-11-19 LAB — TESTOSTERONE, FREE, TOTAL, SHBG: Sex Hormone Binding: 43 nmol/L (ref 13–71)

## 2013-04-20 ENCOUNTER — Other Ambulatory Visit: Payer: Self-pay | Admitting: Internal Medicine

## 2013-04-20 DIAGNOSIS — E785 Hyperlipidemia, unspecified: Secondary | ICD-10-CM

## 2013-04-20 DIAGNOSIS — E039 Hypothyroidism, unspecified: Secondary | ICD-10-CM

## 2013-04-20 NOTE — Telephone Encounter (Signed)
Future order placed 

## 2013-04-24 ENCOUNTER — Emergency Department (HOSPITAL_COMMUNITY)
Admission: EM | Admit: 2013-04-24 | Discharge: 2013-04-24 | Disposition: A | Discharge status: Home / Self Care | Payer: BC Managed Care – PPO | Source: Home / Self Care | Attending: Family Medicine | Admitting: Family Medicine

## 2013-04-24 ENCOUNTER — Encounter (HOSPITAL_COMMUNITY): Payer: Self-pay

## 2013-04-24 DIAGNOSIS — H6121 Impacted cerumen, right ear: Secondary | ICD-10-CM

## 2013-04-24 DIAGNOSIS — H6091 Unspecified otitis externa, right ear: Secondary | ICD-10-CM

## 2013-04-24 DIAGNOSIS — H60399 Other infective otitis externa, unspecified ear: Secondary | ICD-10-CM

## 2013-04-24 DIAGNOSIS — N529 Male erectile dysfunction, unspecified: Secondary | ICD-10-CM | POA: Insufficient documentation

## 2013-04-24 DIAGNOSIS — H6120 Impacted cerumen, unspecified ear: Secondary | ICD-10-CM

## 2013-04-24 MED ORDER — HYDROCORTISONE-ACETIC ACID 1-2 % OT SOLN: 5.0000 [drp] | Freq: Three times a day (TID) | OTIC | Status: AC
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
## 2013-04-24 NOTE — ED Notes (Signed)
Pain and fullness in right ear, think its swimmers ear

## 2013-04-26 NOTE — ED Provider Notes (Signed)
Medical screening examination/treatment/procedure(s) were performed by resident physician or non-physician practitioner and as supervising physician I was immediately available for consultation/collaboration.   Barkley Bruns MD.   Linna Hoff, MD 04/26/13 [phone]

## 2013-06-06 ENCOUNTER — Other Ambulatory Visit: Payer: Self-pay | Admitting: Internal Medicine

## 2013-06-07 NOTE — Telephone Encounter (Signed)
Future orders already placed

## 2013-06-24 ENCOUNTER — Telehealth: Payer: Self-pay | Admitting: *Deleted

## 2013-06-24 MED ORDER — LEVOTHYROXINE SODIUM 200 MCG PO TABS: ORAL_TABLET | ORAL | Status: DC

## 2013-06-24 NOTE — Telephone Encounter (Signed)
Pt states that he is currently out of his medication. Was advised by [facility] on 06/07/13 that he was overdue for lab work. Patient states that he is not able to make an appointment at this time for lab work but needs his medication. Will be able to make an appointment in the next few weeks. Patient states that he needs this taking care of today if possible. Please advise. SW, CMA

## 2013-06-27 ENCOUNTER — Other Ambulatory Visit: Payer: BC Managed Care – PPO

## 2013-06-28 NOTE — Telephone Encounter (Signed)
Spoke with the pt on (06-24-13) and he was scheduled for OV and lab visit.  Rx for #15 only sent to the pharmacy by e-script.//AB/CMA

## 2013-07-06 ENCOUNTER — Ambulatory Visit: Payer: BC Managed Care – PPO | Admitting: Internal Medicine

## 2013-07-06 ENCOUNTER — Other Ambulatory Visit: Payer: Self-pay | Admitting: Internal Medicine

## 2013-07-06 ENCOUNTER — Other Ambulatory Visit (INDEPENDENT_AMBULATORY_CARE_PROVIDER_SITE_OTHER): Payer: BC Managed Care – PPO

## 2013-07-06 DIAGNOSIS — E785 Hyperlipidemia, unspecified: Secondary | ICD-10-CM

## 2013-07-06 DIAGNOSIS — E039 Hypothyroidism, unspecified: Secondary | ICD-10-CM

## 2013-07-06 DIAGNOSIS — I10 Essential (primary) hypertension: Secondary | ICD-10-CM

## 2013-07-06 DIAGNOSIS — E291 Testicular hypofunction: Secondary | ICD-10-CM

## 2013-07-06 LAB — BASIC METABOLIC PANEL
BUN: 23 mg/dL (ref 6–23)
Chloride: 102 mEq/L (ref 96–112)
GFR: 64.21 mL/min (ref >60.00)
Potassium: 4.6 mEq/L (ref 3.5–5.1)

## 2013-07-06 LAB — TSH: TSH: 2.08 u[IU]/mL (ref 0.35–5.50)

## 2013-07-06 LAB — LIPID PANEL
Total CHOL/HDL Ratio: 6
Triglycerides: 195 mg/dL — ABNORMAL HIGH (ref 0.0–149.0)

## 2013-07-06 LAB — TESTOSTERONE: Testosterone: 105.17 ng/dL — ABNORMAL LOW (ref 350.00–890.00)

## 2013-07-14 ENCOUNTER — Ambulatory Visit: Payer: BC Managed Care – PPO | Admitting: Internal Medicine

## 2013-07-18 ENCOUNTER — Encounter: Payer: Self-pay | Admitting: Internal Medicine

## 2013-07-18 ENCOUNTER — Ambulatory Visit (INDEPENDENT_AMBULATORY_CARE_PROVIDER_SITE_OTHER): Payer: BC Managed Care – PPO | Admitting: Internal Medicine

## 2013-07-18 VITALS — BP 155/96 | HR 62 | Temp 97.8°F | Resp 12 | Wt 263.2 lb

## 2013-07-18 DIAGNOSIS — I10 Essential (primary) hypertension: Secondary | ICD-10-CM

## 2013-07-18 DIAGNOSIS — E785 Hyperlipidemia, unspecified: Secondary | ICD-10-CM

## 2013-07-18 DIAGNOSIS — E039 Hypothyroidism, unspecified: Secondary | ICD-10-CM

## 2013-07-18 DIAGNOSIS — E291 Testicular hypofunction: Secondary | ICD-10-CM

## 2013-07-18 MED ORDER — LISINOPRIL 20 MG PO TABS: 20.0000 mg | ORAL_TABLET | Freq: Every day | ORAL | Status: DC

## 2013-07-18 MED ORDER — ATORVASTATIN CALCIUM 20 MG PO TABS: 20.0000 mg | ORAL_TABLET | Freq: Every day | ORAL | Status: DC

## 2013-07-18 MED ORDER — TESTOSTERONE 50 MG/5GM (1%) TD GEL: 5.0000 g | Freq: Every day | TRANSDERMAL | Status: DC

## 2013-07-18 MED ORDER — LEVOTHYROXINE SODIUM 200 MCG PO TABS: ORAL_TABLET | ORAL | Status: DC

## 2013-07-18 NOTE — Assessment & Plan Note (Signed)
No change in dose 

## 2013-07-18 NOTE — Assessment & Plan Note (Signed)
Lisinopril 20 mg BP goals discussed

## 2013-07-18 NOTE — Assessment & Plan Note (Signed)
Rx refilled.

## 2013-07-18 NOTE — Patient Instructions (Addendum)
Minimal Blood Pressure Goal= AVERAGE < 140/90;  Ideal is an AVERAGE < 135/85. This AVERAGE should be calculated from @ least 5-7 BP readings taken @ different times of day on different days of week. You should not respond to isolated BP readings , but rather the AVERAGE for that week .Please bring your  blood pressure cuff to office visits to verify that it is reliable.It  can also be checked against the blood pressure device at the pharmacy. Finger or wrist cuffs are not dependable; an arm cuff is. Please  schedule fasting Labs in 10 weeks after medication & nutrition changes.

## 2013-07-18 NOTE — Assessment & Plan Note (Signed)
Lipitor Rxed Nutritional discussed

## 2013-07-18 NOTE — Progress Notes (Signed)
  Subjective:    Patient ID: Charles [name], male    DOB: [birth year], 62 y.o.   MRN: [id number]  HPI   He returns for followup of his mixed dyslipidemia, hypothyroidism, low testosterone and hypertension.  He's been off the testosterone for approximately 6 weeks; his present testosterone level is 105.17. On testosterone supplementation the value was 414.00. He admits to being sluggish and having decreased libido. He denies muscle weakness. PSA has always been < 1.00.  His hypothyroidism has been corrected. 9 months ago TSH was 5.78; it is now 2.08 on 200 mcg daily.  Lipids reveal TG of 195; these have varied from values of 95-231. As expected the HDL is mildly decreased at 38.4. His LDL is 143.4; based on the advanced cholesterol testing his minimal LDL goal is less than 100.  He is not been checking his blood pressures as he "felt okay".    Review of Systems  No adverse effects noted from medication.  No exercise program ; but job physical.  No specific dietary program; no added salt.     No chest pain, palpitations, claudication,edema or paroxysmal nocturnal dyspnea described. Occasional DOE @ work.  No significant lightheadedness, headache, epistaxis, or syncope         Objective:   Physical Exam Appears  well-nourished & in no acute distress  No carotid bruits are present.No neck pain distention present at 10 - 15 degrees. Thyroid normal to palpation  Slow heart  rate ;regular rhythm  with no significant murmurs or gallops.  Chest is clear with no increased work of breathing  There is no evidence of aortic aneurysm or renal artery bruits  Abdomen soft with no organomegaly or masses. No HJR. Umbilical hernia  No clubbing, cyanosis or edema present.  Pedal pulses are intact   No ischemic skin changes are present . Nails  Healthy   Alert and oriented. Strength, tone, DTRs reflexes normal          Assessment & Plan:

## 2013-09-08 ENCOUNTER — Other Ambulatory Visit: Payer: Self-pay | Admitting: *Deleted

## 2013-09-08 DIAGNOSIS — E291 Testicular hypofunction: Secondary | ICD-10-CM

## 2013-09-08 MED ORDER — TESTOSTERONE 50 MG/5GM (1%) TD GEL: 5.0000 g | Freq: Every day | TRANSDERMAL | Status: DC

## 2013-09-08 MED ORDER — TESTOSTERONE 40.5 MG/2.5GM (1.62%) TD GEL: 1.0000 "application " | Freq: Every day | TRANSDERMAL | Status: DC

## 2013-09-08 NOTE — Telephone Encounter (Signed)
[facility] [address] [facility]

## 2013-09-08 NOTE — Telephone Encounter (Signed)
[facility] [address] [facility]

## 2014-01-04 ENCOUNTER — Other Ambulatory Visit: Payer: Self-pay | Admitting: Internal Medicine

## 2014-02-13 ENCOUNTER — Other Ambulatory Visit: Payer: Self-pay

## 2014-02-13 DIAGNOSIS — E291 Testicular hypofunction: Secondary | ICD-10-CM

## 2014-02-13 MED ORDER — TESTOSTERONE 40.5 MG/2.5GM (1.62%) TD GEL: 1.0000 "application " | Freq: Every day | TRANSDERMAL | Status: DC

## 2014-02-13 NOTE — Telephone Encounter (Signed)
Last ov with you was on 9//2014 Med last filled 09/08/13 1 g plus 3 refills

## 2014-02-13 NOTE — Telephone Encounter (Signed)
OK X1 

## 2014-08-14 ENCOUNTER — Other Ambulatory Visit: Payer: Self-pay

## 2014-08-14 DIAGNOSIS — N529 Male erectile dysfunction, unspecified: Secondary | ICD-10-CM

## 2014-08-14 MED ORDER — TESTOSTERONE 40.5 MG/2.5GM (1.62%) TD GEL: 1.0000 "application " | Freq: Every day | TRANSDERMAL | Status: DC

## 2014-08-14 NOTE — Telephone Encounter (Signed)
[facility]

## 2014-08-14 NOTE — Telephone Encounter (Signed)
OK X 1 month only Last seen 9/14 OV before next refill

## 2014-10-02 ENCOUNTER — Other Ambulatory Visit: Payer: Self-pay | Admitting: Internal Medicine

## 2015-01-05 ENCOUNTER — Other Ambulatory Visit: Payer: Self-pay | Admitting: Internal Medicine

## 2015-01-08 ENCOUNTER — Telehealth: Payer: Self-pay | Admitting: Internal Medicine

## 2015-01-08 MED ORDER — LEVOTHYROXINE SODIUM 200 MCG PO TABS: 200.0000 ug | ORAL_TABLET | Freq: Every day | ORAL | Status: DC

## 2015-01-08 NOTE — Telephone Encounter (Signed)
Patient wife asked if patient could have enough pills of levothyroxine to last him until, his appt which is 01-11-15

## 2015-01-08 NOTE — Telephone Encounter (Signed)
Done

## 2015-01-10 ENCOUNTER — Other Ambulatory Visit (INDEPENDENT_AMBULATORY_CARE_PROVIDER_SITE_OTHER): Payer: Self-pay

## 2015-01-10 ENCOUNTER — Other Ambulatory Visit: Payer: Self-pay | Admitting: Internal Medicine

## 2015-01-10 DIAGNOSIS — E785 Hyperlipidemia, unspecified: Secondary | ICD-10-CM

## 2015-01-10 DIAGNOSIS — E038 Other specified hypothyroidism: Secondary | ICD-10-CM

## 2015-01-10 DIAGNOSIS — Z86010 Personal history of colonic polyps: Secondary | ICD-10-CM

## 2015-01-10 DIAGNOSIS — I10 Essential (primary) hypertension: Secondary | ICD-10-CM

## 2015-01-10 LAB — LIPID PANEL
Cholesterol: 193 mg/dL (ref 0–200)
HDL: 43.4 mg/dL (ref >39.00)
LDL Cholesterol: 130 mg/dL — ABNORMAL HIGH (ref 0–99)
NonHDL: 149.6
TRIGLYCERIDES: 99 mg/dL (ref 0.0–149.0)
Total CHOL/HDL Ratio: 4
VLDL: 19.8 mg/dL (ref 0.0–40.0)

## 2015-01-10 LAB — CBC WITH DIFFERENTIAL/PLATELET
BASOS PCT: 0.8 % (ref 0.0–3.0)
Basophils Absolute: 0.1 10*3/uL (ref 0.0–0.1)
EOS PCT: 4.4 % (ref 0.0–5.0)
Eosinophils Absolute: 0.3 10*3/uL (ref 0.0–0.7)
HCT: 43.3 % (ref 39.0–52.0)
HEMOGLOBIN: 15 g/dL (ref 13.0–17.0)
LYMPHS ABS: 2.8 10*3/uL (ref 0.7–4.0)
Lymphocytes Relative: 36.6 % (ref 12.0–46.0)
MCHC: 34.6 g/dL (ref 30.0–36.0)
MCV: 91.3 fl (ref 78.0–100.0)
MONOS PCT: 7.1 % (ref 3.0–12.0)
Monocytes Absolute: 0.5 10*3/uL (ref 0.1–1.0)
NEUTROS ABS: 3.9 10*3/uL (ref 1.4–7.7)
Neutrophils Relative %: 51.1 % (ref 43.0–77.0)
Platelets: 290 10*3/uL (ref 150.0–400.0)
RBC: 4.75 Mil/uL (ref 4.22–5.81)
RDW: 12.9 % (ref 11.5–15.5)
WBC: 7.7 10*3/uL (ref 4.0–10.5)

## 2015-01-10 LAB — BASIC METABOLIC PANEL
BUN: 19 mg/dL (ref 6–23)
CALCIUM: 9.5 mg/dL (ref 8.4–10.5)
CO2: 27 mEq/L (ref 19–32)
Chloride: 104 mEq/L (ref 96–112)
Creatinine, Ser: 1.16 mg/dL (ref 0.40–1.50)
GFR: 67.72 mL/min (ref >60.00)
GLUCOSE: 105 mg/dL — AB (ref 70–99)
Potassium: 4.5 mEq/L (ref 3.5–5.1)
SODIUM: 137 meq/L (ref 135–145)

## 2015-01-10 LAB — HEPATIC FUNCTION PANEL
ALT: 27 U/L (ref 0–53)
AST: 25 U/L (ref 0–37)
Albumin: 4.1 g/dL (ref 3.5–5.2)
Alkaline Phosphatase: 78 U/L (ref 39–117)
BILIRUBIN DIRECT: 0.1 mg/dL (ref 0.0–0.3)
BILIRUBIN TOTAL: 0.5 mg/dL (ref 0.2–1.2)
Total Protein: 7.3 g/dL (ref 6.0–8.3)

## 2015-01-10 LAB — TSH: TSH: 0.75 u[IU]/mL (ref 0.35–4.50)

## 2015-01-11 ENCOUNTER — Encounter: Payer: Self-pay | Admitting: Internal Medicine

## 2015-01-11 ENCOUNTER — Ambulatory Visit (INDEPENDENT_AMBULATORY_CARE_PROVIDER_SITE_OTHER): Payer: 59 | Admitting: Internal Medicine

## 2015-01-11 ENCOUNTER — Telehealth: Payer: Self-pay | Admitting: Internal Medicine

## 2015-01-11 ENCOUNTER — Other Ambulatory Visit (INDEPENDENT_AMBULATORY_CARE_PROVIDER_SITE_OTHER): Payer: 59

## 2015-01-11 VITALS — BP 140/94 | HR 63 | Temp 98.1°F | Ht 71.0 in | Wt 260.0 lb

## 2015-01-11 DIAGNOSIS — E038 Other specified hypothyroidism: Secondary | ICD-10-CM | POA: Diagnosis not present

## 2015-01-11 DIAGNOSIS — R739 Hyperglycemia, unspecified: Secondary | ICD-10-CM

## 2015-01-11 DIAGNOSIS — E785 Hyperlipidemia, unspecified: Secondary | ICD-10-CM | POA: Diagnosis not present

## 2015-01-11 DIAGNOSIS — E291 Testicular hypofunction: Secondary | ICD-10-CM | POA: Diagnosis not present

## 2015-01-11 DIAGNOSIS — E349 Endocrine disorder, unspecified: Secondary | ICD-10-CM

## 2015-01-11 DIAGNOSIS — I10 Essential (primary) hypertension: Secondary | ICD-10-CM | POA: Diagnosis not present

## 2015-01-11 DIAGNOSIS — Z860100 Personal history of colon polyps, unspecified: Secondary | ICD-10-CM

## 2015-01-11 DIAGNOSIS — R7303 Prediabetes: Secondary | ICD-10-CM | POA: Insufficient documentation

## 2015-01-11 LAB — PSA: PSA: 0.28 ng/mL (ref 0.10–4.00)

## 2015-01-11 LAB — TESTOSTERONE: Testosterone: 114.41 ng/dL — ABNORMAL LOW (ref 300.00–890.00)

## 2015-01-11 LAB — HEMOGLOBIN A1C: HEMOGLOBIN A1C: 5.7 % (ref 4.6–6.5)

## 2015-01-11 MED ORDER — LEVOTHYROXINE SODIUM 200 MCG PO TABS: 200.0000 ug | ORAL_TABLET | Freq: Every day | ORAL | Status: DC

## 2015-01-11 MED ORDER — METOPROLOL TARTRATE 50 MG PO TABS: ORAL_TABLET | ORAL | Status: DC

## 2015-01-11 MED ORDER — TADALAFIL 20 MG PO TABS: ORAL_TABLET | ORAL | Status: DC

## 2015-01-11 NOTE — Progress Notes (Signed)
   Subjective:    Patient ID: Charles [name], male    DOB: 05-04-1951, 64 y.o.   MRN: [id number]  HPI The patient is here to assess status of active health conditions.  PMH, FH, & Social History reviewed & updated.  He has been off his statin and blood pressure medicine for 6 months. He thought that the ACE inhibitor caused pain in the medial knee area bilaterally. He also had some discomfort in his calf.  He states his blood pressure averages 140/90 @ home.  He eats fried foods as well as increased amounts of red meat. He does restrict salt. He is physically active in his job but does not exercise via any regular program..  He has a history of colon polyps but has not had a colonoscopy for over 10 years. He has no active GI symptoms.  Based on advanced lipid testing his  LDL goal is less than 100, ideally less than 70.  His labs were reviewed. LDL is 130 indicating 30% increased risk. Fasting glucose is 105.  His mother had a stroke at 40; sister had a heart attack at 72. 3 sisters have diabetes.  He has been off his testosterone supplement for 2 months. He thinks his father had prostate cancer.   Review of Systems  Chest pain, palpitations, tachycardia, exertional dyspnea, paroxysmal nocturnal dyspnea, claudication or edema are absent.  Unexplained weight loss, abdominal pain, significant dyspepsia, dysphagia, melena, rectal bleeding, or persistently small caliber stools are denied.  Dysuria, pyuria, hematuria, frequency, nocturia or polyuria are denied. He describes some blurred vision.  He denies any changes in hair, skin,or nails. He also denies cold or heat intolerance.    Objective:   Physical Exam  Pertinent or positive findings include: He has complete dentures.  He exhibits a slow S4.  Abdomen is protuberant.  Umbilical hernia is present.  He has crepitus of his knees.  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  Prostate is not enlarged; there is no nodularity or  induration.  General appearance :adequately nourished; in no distress. Eyes: No conjunctival inflammation or scleral icterus is present. Oral exam:  Lips and gums are healthy appearing.There is no oropharyngeal erythema or exudate noted. Heart:  Normal rate and regular rhythm. S1 and S2 normal without gallop, murmur, click, or rub . Lungs:Chest clear to auscultation; no wheezes, rhonchi,rales ,or rubs present.No increased work of breathing.  Abdomen: bowel sounds normal, soft and non-tender without masses, or organomegaly noted.  No guarding or rebound.  Vascular : all pulses equal ; no bruits present. Skin:Warm & dry.  Intact without suspicious lesions or rashes ; no tenting or jaundice  Lymphatic: No lymphadenopathy is noted about the head, neck, axilla, or inguinal areas.  Neuro: Strength, tone & DTRs normal.        Assessment & Plan:  See Current Assessment & Plan in Problem List under specific Diagnosis

## 2015-01-11 NOTE — Progress Notes (Signed)
Pre visit review using our clinic review tool, if applicable. No additional management support is needed unless otherwise documented below in the visit note. 

## 2015-01-11 NOTE — Assessment & Plan Note (Signed)
GI referral

## 2015-01-11 NOTE — Telephone Encounter (Signed)
emmi emailed °

## 2015-01-11 NOTE — Assessment & Plan Note (Signed)
A1c

## 2015-01-11 NOTE — Assessment & Plan Note (Signed)
PSA

## 2015-01-11 NOTE — Assessment & Plan Note (Signed)
Asked to follow a Mediaterranean type diet  (many good cook books readily available) or review Dr Nunzio Cory book Eat, [place] for best  dietary cholesterol information & options. Cardiovascular exercise recommended 30-45 minutes 3-4 times per week. Lipids after 4 months;statin if not @ goal

## 2015-01-11 NOTE — Patient Instructions (Addendum)
Minimal Blood Pressure Goal= AVERAGE < 140/90;  Ideal is an AVERAGE < 135/85. This AVERAGE should be calculated from @ least 5-7 BP readings taken @ different times of day on different days of week. You should not respond to isolated BP readings , but rather the AVERAGE for that week .Please bring your  blood pressure cuff to office visits to verify that it is reliable.It  can also be checked against the blood pressure device at the pharmacy. Finger or wrist cuffs are not dependable; an arm cuff is.   Please follow a Mediaterranean type diet  (many good cook books readily available) or review Dr Nunzio Cory book Eat, [place] for best  dietary cholesterol information & options.  Cardiovascular exercise, this can be as simple a program as walking, is recommended 30-45 minutes 3-4 times per week. If you're not exercising you should take 6-8 weeks to build up to this level.  A fasting cholesterol panel recommended after 4 months to assess risk.  If you cannot make significant changes in exercise and nutrition; statin medication at low dose would be the best therapeutic option to reduce your long term risk.  The GI referral will be scheduled and you'll be notified of the time.Please call the Referral Co-Ordinator @ [phone] if you have not been notified of appointment time within 7-10 days.

## 2015-01-11 NOTE — Assessment & Plan Note (Signed)
No change 

## 2015-01-15 ENCOUNTER — Telehealth: Payer: Self-pay

## 2015-01-15 ENCOUNTER — Other Ambulatory Visit: Payer: Self-pay

## 2015-01-15 DIAGNOSIS — N529 Male erectile dysfunction, unspecified: Secondary | ICD-10-CM

## 2015-01-15 MED ORDER — TESTOSTERONE 40.5 MG/2.5GM (1.62%) TD GEL: 1.0000 "application " | Freq: Every day | TRANSDERMAL | Status: DC

## 2015-01-15 NOTE — Telephone Encounter (Signed)
Received a fax from [facility] [employer] on [address]. Androgel 1.62% gel requires prior authorization. Completed form via cover my meds. Waiting on insurance response.

## 2015-01-15 NOTE — Telephone Encounter (Signed)
Script for testosterone has been faxed to [facility]

## 2015-01-16 NOTE — Telephone Encounter (Signed)
[employer] denied coverage on Androgel,.

## 2015-01-17 NOTE — Telephone Encounter (Signed)
Pt called back and said that his colonoscopy is not due til Aug 2016

## 2015-01-22 NOTE — Telephone Encounter (Signed)
Last 2 testosterone levels have been faxed to [facility] [phone] to try and get Androgel approved.

## 2015-01-26 NOTE — Telephone Encounter (Signed)
Form is in Dr [employer] waiting completion

## 2015-01-26 NOTE — Telephone Encounter (Signed)
Please see serial testosterone levels; on 3 separate occasions off testosterone supplementation he has had extremely low levels. On supplementation testosterone levels have returned to normal.

## 2015-01-26 NOTE — Telephone Encounter (Addendum)
I called [phone] and spoke to patient's insurance. A new prior authorization form needs to be filled out and faxed along with the labs so Androgel 1% can get approved. The representative I spoke with will fax a new form

## 2015-01-26 NOTE — Telephone Encounter (Signed)
Patient called requesting update on prior auth. Advised that per notes, we most likely have not heard back from 01/22/2015 fax.

## 2015-01-28 ENCOUNTER — Other Ambulatory Visit: Payer: Self-pay | Admitting: Internal Medicine

## 2015-01-28 DIAGNOSIS — E349 Endocrine disorder, unspecified: Secondary | ICD-10-CM

## 2015-01-29 ENCOUNTER — Telehealth: Payer: Self-pay

## 2015-01-29 NOTE — Telephone Encounter (Signed)
Phone call to patient. Left a message that he can stop by lab for bloodwork.   Note: this lab work is needed so prior authorization for Androgel can be completed

## 2015-01-29 NOTE — Telephone Encounter (Signed)
-----   Message from Hendricks Limes, MD sent at 01/28/2015  5:58 PM EDT ----- Insurance requires free testosterone level before starting testosterone;order entered

## 2015-01-30 ENCOUNTER — Other Ambulatory Visit: Payer: 59

## 2015-01-30 DIAGNOSIS — E349 Endocrine disorder, unspecified: Secondary | ICD-10-CM

## 2015-01-31 LAB — TESTOSTERONE, FREE, TOTAL, SHBG
Sex Hormone Binding: 22 nmol/L (ref 22–77)
TESTOSTERONE-% FREE: 2.3 % (ref 1.6–2.9)
Testosterone, Free: 34.1 pg/mL — ABNORMAL LOW (ref 47.0–244.0)
Testosterone: 147 ng/dL — ABNORMAL LOW (ref 300–890)

## 2015-02-01 NOTE — Telephone Encounter (Signed)
Androgel has been denied stating patient needs try/fail either Androderm or brand Testim.

## 2015-02-01 NOTE — Telephone Encounter (Signed)
Patient completed lab work. Results and prior authorization have been faxed to [facility] to see if Androgel will be approved.

## 2015-02-01 NOTE — Telephone Encounter (Signed)
Whichever is cheaper

## 2015-02-01 NOTE — Telephone Encounter (Signed)
I advised patient and he will check with the pharmacy as to which medication will be cheaper for him.

## 2015-02-05 MED ORDER — TESTOSTERONE 50 MG/5GM (1%) TD GEL: TRANSDERMAL | Status: DC

## 2015-02-05 NOTE — Telephone Encounter (Signed)
Insurance will cover Testim but needs authorization. Pharmacy [facility] [address] [facility]

## 2015-02-05 NOTE — Telephone Encounter (Signed)
Dr Linna Darner, Please provide qty, directions etc

## 2015-02-05 NOTE — Addendum Note (Signed)
Addended by: Roma Schanz R on: 02/05/2015 01:45 PM   Modules accepted: Orders, Medications

## 2015-02-05 NOTE — Telephone Encounter (Signed)
Testim requires PA. Form completed on line. Waiting for insurance to respond

## 2015-02-05 NOTE — Telephone Encounter (Signed)
Testim has been denied

## 2015-02-05 NOTE — Telephone Encounter (Addendum)
Script has been faxed to [facility]

## 2015-02-05 NOTE — Telephone Encounter (Signed)
Unable to submit form online. Ended up calling [facility] @ [phone]. Completed all info with representative. She states the office will be notified by fax of final decision.

## 2015-02-05 NOTE — Telephone Encounter (Signed)
Testim 5 g (1 tube)  once daily applied to clean dry skin of the shoulders or upper arms. Do not apply to genitalia or abdomen dispense one-month supply with 5 refills.

## 2015-02-19 ENCOUNTER — Other Ambulatory Visit: Payer: Self-pay | Admitting: Internal Medicine

## 2015-02-19 DIAGNOSIS — E349 Endocrine disorder, unspecified: Secondary | ICD-10-CM

## 2015-02-19 NOTE — Telephone Encounter (Signed)
Patient called you back. Please call him back today.

## 2015-02-19 NOTE — Telephone Encounter (Signed)
Attempted to notify patient. All information requested was sent in. I just spoke to a representative and she states since this was the second time initiating an authorization on this medication it will not be approved. Will have to go through appeals process.

## 2015-02-19 NOTE — Telephone Encounter (Signed)
Patient has been advised

## 2015-02-19 NOTE — Telephone Encounter (Addendum)
I'm sorry but we have filled out the preauthorization forms on multiple occasions with lab test documentation that testosterone deficiency is present. I will refer you to a Urologist; hopefully the sub specialists can get this covered.  The clerk states that this will have to go through the appeals process .There is nothing to appeal as testosterone deficiency has been documented and that information sent to them.

## 2015-02-19 NOTE — Telephone Encounter (Signed)
Pt called in and said that [facility] will approve his Testim if we just call [phone].  He just got off the phone with them.  He would like [place] to call them again  He said that they were waiting for info from [place] and never rec'd it.  If we would call them they would approve this for the pt?

## 2015-02-25 ENCOUNTER — Encounter: Payer: Self-pay | Admitting: Internal Medicine

## 2015-02-25 DIAGNOSIS — E349 Endocrine disorder, unspecified: Secondary | ICD-10-CM

## 2015-02-28 NOTE — Telephone Encounter (Signed)
Letter of appeals has been mailed to patient's insurance and a copy to patient.

## 2015-04-12 ENCOUNTER — Encounter: Payer: Self-pay | Admitting: Internal Medicine

## 2015-06-07 ENCOUNTER — Telehealth: Payer: Self-pay | Admitting: Internal Medicine

## 2015-06-07 NOTE — Telephone Encounter (Signed)
Pt called in and said that his ins is requesting a PA on his Testerone Gel .  They said that faxed it over.  Have you seen it?     Best number [phone] [place] health phone - [phone]

## 2015-06-08 NOTE — Telephone Encounter (Signed)
PA has been started with [employer], pts wife has been notified.

## 2015-06-08 NOTE — Telephone Encounter (Signed)
pts wife called and insurance still hasn't received the PA for the gel. Dr. Linna Darner sent them to Dr. Gaynelle Arabian and they have been trying to get a hold of him and been leaving messages but no one has returned their call yet.  They have been trying to get this for quite some time now and they are at a loss of what to do. Can you please call his wife at [phone]

## 2015-06-14 ENCOUNTER — Telehealth: Payer: Self-pay | Admitting: Emergency Medicine

## 2015-06-14 NOTE — Telephone Encounter (Signed)
Spoke with Rep from [employer], she stated that because the testosterone gel had been denied twice that it needed to be sent to insurance. 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

## 2015-08-24 ENCOUNTER — Other Ambulatory Visit: Payer: Self-pay | Admitting: Internal Medicine

## 2015-09-19 ENCOUNTER — Other Ambulatory Visit: Payer: Self-pay | Admitting: Internal Medicine

## 2015-10-12 ENCOUNTER — Encounter: Payer: Self-pay | Admitting: Internal Medicine

## 2015-10-12 ENCOUNTER — Ambulatory Visit (INDEPENDENT_AMBULATORY_CARE_PROVIDER_SITE_OTHER): Payer: 59 | Admitting: Internal Medicine

## 2015-10-12 ENCOUNTER — Other Ambulatory Visit: Payer: Self-pay | Admitting: Internal Medicine

## 2015-10-12 ENCOUNTER — Other Ambulatory Visit (INDEPENDENT_AMBULATORY_CARE_PROVIDER_SITE_OTHER): Payer: 59

## 2015-10-12 VITALS — BP 132/78 | HR 55 | Temp 98.1°F | Resp 18 | Wt 267.0 lb

## 2015-10-12 DIAGNOSIS — Z23 Encounter for immunization: Secondary | ICD-10-CM

## 2015-10-12 DIAGNOSIS — R739 Hyperglycemia, unspecified: Secondary | ICD-10-CM

## 2015-10-12 DIAGNOSIS — E039 Hypothyroidism, unspecified: Secondary | ICD-10-CM

## 2015-10-12 DIAGNOSIS — E785 Hyperlipidemia, unspecified: Secondary | ICD-10-CM

## 2015-10-12 DIAGNOSIS — E349 Endocrine disorder, unspecified: Secondary | ICD-10-CM

## 2015-10-12 DIAGNOSIS — Z86010 Personal history of colonic polyps: Secondary | ICD-10-CM | POA: Diagnosis not present

## 2015-10-12 DIAGNOSIS — E291 Testicular hypofunction: Secondary | ICD-10-CM | POA: Diagnosis not present

## 2015-10-12 DIAGNOSIS — I10 Essential (primary) hypertension: Secondary | ICD-10-CM | POA: Diagnosis not present

## 2015-10-12 LAB — CBC WITH DIFFERENTIAL/PLATELET
BASOS PCT: 0.9 % (ref 0.0–3.0)
Basophils Absolute: 0.1 10*3/uL (ref 0.0–0.1)
Eosinophils Absolute: 0.3 10*3/uL (ref 0.0–0.7)
Eosinophils Relative: 3.8 % (ref 0.0–5.0)
HEMATOCRIT: 42 % (ref 39.0–52.0)
Hemoglobin: 14.3 g/dL (ref 13.0–17.0)
LYMPHS ABS: 2.8 10*3/uL (ref 0.7–4.0)
LYMPHS PCT: 35.5 % (ref 12.0–46.0)
MCHC: 34.1 g/dL (ref 30.0–36.0)
MCV: 91.7 fl (ref 78.0–100.0)
MONOS PCT: 6.1 % (ref 3.0–12.0)
Monocytes Absolute: 0.5 10*3/uL (ref 0.1–1.0)
NEUTROS ABS: 4.3 10*3/uL (ref 1.4–7.7)
NEUTROS PCT: 53.7 % (ref 43.0–77.0)
PLATELETS: 263 10*3/uL (ref 150.0–400.0)
RBC: 4.58 Mil/uL (ref 4.22–5.81)
RDW: 12.9 % (ref 11.5–15.5)
WBC: 8 10*3/uL (ref 4.0–10.5)

## 2015-10-12 LAB — BASIC METABOLIC PANEL
BUN: 19 mg/dL (ref 6–23)
CALCIUM: 9.7 mg/dL (ref 8.4–10.5)
CHLORIDE: 103 meq/L (ref 96–112)
CO2: 26 meq/L (ref 19–32)
CREATININE: 1.12 mg/dL (ref 0.40–1.50)
GFR: 70.35 mL/min (ref >60.00)
GLUCOSE: 92 mg/dL (ref 70–99)
Potassium: 4.4 mEq/L (ref 3.5–5.1)
SODIUM: 138 meq/L (ref 135–145)

## 2015-10-12 LAB — TSH: TSH: 0.27 u[IU]/mL — AB (ref 0.35–4.50)

## 2015-10-12 LAB — LIPID PANEL
CHOL/HDL RATIO: 5
Cholesterol: 189 mg/dL (ref 0–200)
HDL: 39.1 mg/dL (ref >39.00)
LDL Cholesterol: 119 mg/dL — ABNORMAL HIGH (ref 0–99)
NONHDL: 149.52
Triglycerides: 155 mg/dL — ABNORMAL HIGH (ref 0.0–149.0)
VLDL: 31 mg/dL (ref 0.0–40.0)

## 2015-10-12 LAB — HEPATIC FUNCTION PANEL
ALT: 30 U/L (ref 0–53)
AST: 25 U/L (ref 0–37)
Albumin: 4.2 g/dL (ref 3.5–5.2)
Alkaline Phosphatase: 74 U/L (ref 39–117)
BILIRUBIN DIRECT: 0.1 mg/dL (ref 0.0–0.3)
BILIRUBIN TOTAL: 0.5 mg/dL (ref 0.2–1.2)
Total Protein: 7.4 g/dL (ref 6.0–8.3)

## 2015-10-12 LAB — HEMOGLOBIN A1C: HEMOGLOBIN A1C: 5.8 % (ref 4.6–6.5)

## 2015-10-12 MED ORDER — METOPROLOL TARTRATE 50 MG PO TABS: 25.0000 mg | ORAL_TABLET | Freq: Two times a day (BID) | ORAL | Status: DC

## 2015-10-12 MED ORDER — LEVOTHYROXINE SODIUM 200 MCG PO TABS: 200.0000 ug | ORAL_TABLET | Freq: Every day | ORAL | Status: DC

## 2015-10-12 NOTE — Progress Notes (Signed)
Pre visit review using our clinic review tool, if applicable. No additional management support is needed unless otherwise documented below in the visit note. 

## 2015-10-12 NOTE — Assessment & Plan Note (Signed)
As per Dr Gaynelle Arabian

## 2015-10-12 NOTE — Patient Instructions (Addendum)
  Your next office appointment will be determined based upon review of your pending labs. Those written interpretation of the lab results and instructions will be transmitted to you by mail for your records. Critical results will be called.   Followup as needed for any active or acute issue. Please report any significant change in your symptoms.  Minimal Blood Pressure Goal= AVERAGE < 140/90;  Ideal is an AVERAGE < 135/85. This AVERAGE should be calculated from @ least 5-7 BP readings taken @ different times of day on different days of week. You should not respond to isolated BP readings , but rather the AVERAGE for that week .Please bring your  blood pressure cuff to office visits to verify that it is reliable.It  can also be checked against the blood pressure device at the pharmacy. Finger or wrist cuffs are not dependable; an arm cuff is.  Consider glucosamine sulfate 1500 mg daily for joint symptoms. Take this daily  for 3 months and then leave it off for 2 months. This will rehydrate the cartilages. Turmeric is a supplement which is being touted as having a very positive impact on joint symptoms A heated paraffin wax bath can be used if arthritis of the hands is the major issue.

## 2015-10-12 NOTE — Assessment & Plan Note (Signed)
TSH 

## 2015-10-12 NOTE — Assessment & Plan Note (Signed)
CBC GI referral 

## 2015-10-12 NOTE — Assessment & Plan Note (Signed)
Lipids, LFTs, TSH  

## 2015-10-12 NOTE — Assessment & Plan Note (Signed)
Blood pressure goals reviewed. BMET 

## 2015-10-12 NOTE — Assessment & Plan Note (Signed)
A1c

## 2015-10-12 NOTE — Progress Notes (Signed)
   Subjective:    Patient ID: Charles [name], male    DOB: 1951/07/31, 64 y.o.   MRN: [id number]  HPI The patient is here to assess status of active health conditions.  PMH, FH, & Social History reviewed & updated.No change in [place] as recorded.  He is on a modified heart healthy diet. 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 He has been compliant with his thyroid and beta blocker.  He still has not been able to have his testosterone filled by his insurance,  despite ongoing decreased libido. Testosterone levels have been persistently below normal both here as well as at his Urologist office. He does deny any muscle weakness  Blood pressure at home averages 130/75-80.  Review of systems is positive for arthritis in his hands with some associated swelling.  He rarely has dysphagia, on average once a month. PMH of colon polyps @ colonoscopy : Standard care reviewed.  He's had some blurring of the right eye.  He also has cold intolerance.   Review of Systems  Chest pain, palpitations, tachycardia, exertional dyspnea, paroxysmal nocturnal dyspnea, claudication or edema are absent. No unexplained weight loss, abdominal pain, significant dyspepsia,  melena, rectal bleeding, or persistently small caliber stools. Dysuria, pyuria, hematuria, frequency, nocturia or polyuria are denied. Change in hair, skin, nails denied. No bowel changes of constipation or diarrhea. No intolerance to heat .     Objective:   Physical Exam Pertinent or positive findings include: Complete dentures are present. He has a Museum/gallery conservator and a Civil Service fast streamer. Abdomen is protuberant with an umbilical hernia. He has mild DIP OA  Changes.  General appearance :adequately nourished; in no distress.  Eyes: No conjunctival inflammation or scleral icterus is present.  Oral exam:  Lips and gums are healthy appearing.There is no oropharyngeal erythema or exudate noted.   Heart:  Slow rate and regular  rhythm. S1 and S2 normal without gallop, murmur, click, rub or other extra sounds    Lungs:Chest clear to auscultation; no wheezes, rhonchi,rales ,or rubs present.No increased work of breathing.   Abdomen: bowel sounds normal, soft and non-tender without masses, or organomegaly  noted.  No guarding or rebound.   Vascular : all pulses equal ; no bruits present.  Skin:Warm & dry.  Intact without suspicious lesions or rashes ; no tenting or jaundice   Lymphatic: No lymphadenopathy is noted about the head, neck, axilla.   Neuro: Strength, tone & DTRs normal.     Assessment & Plan:  See Current Assessment & Plan in Problem List under specific Diagnosis

## 2015-10-16 ENCOUNTER — Telehealth: Payer: Self-pay | Admitting: Emergency Medicine

## 2015-10-16 ENCOUNTER — Encounter: Payer: Self-pay | Admitting: Internal Medicine

## 2015-10-16 NOTE — Telephone Encounter (Signed)
Spoke with pt to inform.  

## 2015-10-16 NOTE — Telephone Encounter (Signed)
-----   Message from Hendricks Limes, MD sent at 10/15/2015 11:02 AM EST ----- Please notify Mr Charles [name] ----- Message -----    From: Irene Shipper, MD    Sent: 10/12/2015   6:35 PM      To: Hendricks Limes, MD  Hopp, His prior colon was June 2006. He was sent a recall letter 04-12-2015 (in EPIC) at that time informing him that he was due. It was recommended that he call our office to schedule. Thanks for double checking. Merry Christmas. Charles [name]  ----- Message -----    From: Hendricks Limes, MD    Sent: 10/12/2015  10:30 AM      To: Irene Shipper, MD  At your convenience: Please verify when follow up colonoscopy is due based on your records.Thanks, [employer]

## 2015-11-30 ENCOUNTER — Telehealth: Payer: Self-pay

## 2015-11-30 NOTE — Telephone Encounter (Signed)
P.A. Approved, pt informed  °

## 2015-11-30 NOTE — Telephone Encounter (Signed)
PA initiated via CoverMyMeds with new Ins info (ID [id number], Bin [id number]), Key [id number]

## 2015-12-05 NOTE — Telephone Encounter (Signed)
Patient called to advise that the pharmacy told him they did not have a PA approval on file. Also verified that they had not spoken since Friday. Encouraged him to call the pharmacy back, as their records had probably updated by now.

## 2016-02-04 ENCOUNTER — Other Ambulatory Visit: Payer: Self-pay

## 2016-02-18 ENCOUNTER — Other Ambulatory Visit: Payer: Self-pay

## 2016-02-18 MED ORDER — TADALAFIL 20 MG PO TABS: ORAL_TABLET | ORAL | Status: DC

## 2016-04-07 ENCOUNTER — Other Ambulatory Visit (INDEPENDENT_AMBULATORY_CARE_PROVIDER_SITE_OTHER): Payer: BLUE CROSS/BLUE SHIELD

## 2016-04-07 ENCOUNTER — Ambulatory Visit (INDEPENDENT_AMBULATORY_CARE_PROVIDER_SITE_OTHER): Payer: BLUE CROSS/BLUE SHIELD | Admitting: Internal Medicine

## 2016-04-07 ENCOUNTER — Encounter: Payer: Self-pay | Admitting: Internal Medicine

## 2016-04-07 VITALS — BP 138/82 | HR 61 | Temp 98.2°F | Resp 18 | Wt 273.0 lb

## 2016-04-07 DIAGNOSIS — E038 Other specified hypothyroidism: Secondary | ICD-10-CM

## 2016-04-07 DIAGNOSIS — E669 Obesity, unspecified: Secondary | ICD-10-CM

## 2016-04-07 DIAGNOSIS — E291 Testicular hypofunction: Secondary | ICD-10-CM

## 2016-04-07 DIAGNOSIS — I10 Essential (primary) hypertension: Secondary | ICD-10-CM | POA: Diagnosis not present

## 2016-04-07 DIAGNOSIS — E785 Hyperlipidemia, unspecified: Secondary | ICD-10-CM

## 2016-04-07 DIAGNOSIS — R739 Hyperglycemia, unspecified: Secondary | ICD-10-CM | POA: Diagnosis not present

## 2016-04-07 DIAGNOSIS — Z1159 Encounter for screening for other viral diseases: Secondary | ICD-10-CM

## 2016-04-07 DIAGNOSIS — E349 Endocrine disorder, unspecified: Secondary | ICD-10-CM

## 2016-04-07 DIAGNOSIS — F528 Other sexual dysfunction not due to a substance or known physiological condition: Secondary | ICD-10-CM

## 2016-04-07 DIAGNOSIS — Z1211 Encounter for screening for malignant neoplasm of colon: Secondary | ICD-10-CM

## 2016-04-07 DIAGNOSIS — E66811 Obesity, class 1: Secondary | ICD-10-CM | POA: Insufficient documentation

## 2016-04-07 LAB — HEMOGLOBIN A1C: HEMOGLOBIN A1C: 5.6 % (ref 4.6–6.5)

## 2016-04-07 LAB — COMPREHENSIVE METABOLIC PANEL
ALBUMIN: 4.1 g/dL (ref 3.5–5.2)
ALK PHOS: 76 U/L (ref 39–117)
ALT: 24 U/L (ref 0–53)
AST: 22 U/L (ref 0–37)
BUN: 15 mg/dL (ref 6–23)
CALCIUM: 9.3 mg/dL (ref 8.4–10.5)
CO2: 23 mEq/L (ref 19–32)
CREATININE: 1.18 mg/dL (ref 0.40–1.50)
Chloride: 106 mEq/L (ref 96–112)
GFR: 66.13 mL/min (ref >60.00)
Glucose, Bld: 77 mg/dL (ref 70–99)
POTASSIUM: 3.9 meq/L (ref 3.5–5.1)
SODIUM: 140 meq/L (ref 135–145)
TOTAL PROTEIN: 7.2 g/dL (ref 6.0–8.3)
Total Bilirubin: 0.5 mg/dL (ref 0.2–1.2)

## 2016-04-07 LAB — TSH: TSH: 0.25 u[IU]/mL — ABNORMAL LOW (ref 0.35–4.50)

## 2016-04-07 LAB — HEPATITIS C ANTIBODY: HCV AB: NEGATIVE

## 2016-04-07 MED ORDER — TADALAFIL 20 MG PO TABS: ORAL_TABLET | ORAL | Status: DC

## 2016-04-07 MED ORDER — TESTOSTERONE 50 MG/5GM (1%) TD GEL: TRANSDERMAL | Status: DC

## 2016-04-07 MED ORDER — METOPROLOL TARTRATE 50 MG PO TABS: 25.0000 mg | ORAL_TABLET | Freq: Two times a day (BID) | ORAL | Status: DC

## 2016-04-07 NOTE — Patient Instructions (Addendum)
  Test(s) ordered today. Your results will be released to MyChart (or called to you) after review, usually within 72hours after test completion. If any changes need to be made, you will be notified at that same time.  All other Health Maintenance issues reviewed.   All recommended immunizations and age-appropriate screenings are up-to-date or discussed.  No immunizations administered today.   Medications reviewed and updated.  No changes recommended at this time.  Your prescription(s) have been submitted to your pharmacy. Please take as directed and contact our office if you believe you are having problem(s) with the medication(s).   Please followup in one year   

## 2016-04-07 NOTE — Assessment & Plan Note (Signed)
Does not want to follow with urology Discussed possible risks of testosterone, including increased risk of prostate cancer and blood clots rx renewed today Check testosterone level and psa

## 2016-04-07 NOTE — Assessment & Plan Note (Signed)
Taking cialis - rx provided today

## 2016-04-07 NOTE — Assessment & Plan Note (Signed)
Check tsh  Titrate med dose if needed  

## 2016-04-07 NOTE — Progress Notes (Signed)
Pre visit review using our clinic review tool, if applicable. No additional management support is needed unless otherwise documented below in the visit note. 
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
## 2016-04-07 NOTE — Assessment & Plan Note (Signed)
BP well controlled Current regimen effective and well tolerated Continue current medications at current doses cmp  

## 2016-04-07 NOTE — Assessment & Plan Note (Signed)
Encouraged weight loss 

## 2016-04-07 NOTE — Assessment & Plan Note (Signed)
Lipid panel controlled 6 months ago Will hold off on checking since he is not fasting

## 2016-04-07 NOTE — Assessment & Plan Note (Signed)
Check a1c 

## 2016-04-08 ENCOUNTER — Other Ambulatory Visit: Payer: Self-pay | Admitting: Internal Medicine

## 2016-04-08 DIAGNOSIS — E038 Other specified hypothyroidism: Secondary | ICD-10-CM

## 2016-04-08 LAB — PSA, TOTAL AND FREE
PSA, Free Pct: 17 % — ABNORMAL LOW (ref >25)
PSA: 0.35 ng/mL (ref <=4.00)

## 2016-04-08 LAB — TESTOSTERONE, FREE, TOTAL, SHBG
SEX HORMONE BINDING: 31.4 nmol/L (ref 19.3–76.4)
TESTOSTERONE: 26 ng/dL — AB (ref 348–1197)
Testosterone, Free: 2 pg/mL — ABNORMAL LOW (ref 6.6–18.1)

## 2016-04-08 MED ORDER — LEVOTHYROXINE SODIUM 175 MCG PO TABS
175.0000 ug | ORAL_TABLET | Freq: Every day | ORAL | Status: DC
Start: 2016-04-08 — End: 2016-10-02

## 2016-05-12 ENCOUNTER — Ambulatory Visit (AMBULATORY_SURGERY_CENTER): Payer: BLUE CROSS/BLUE SHIELD | Admitting: *Deleted

## 2016-05-12 VITALS — Ht 71.0 in | Wt 270.0 lb

## 2016-05-12 DIAGNOSIS — Z1211 Encounter for screening for malignant neoplasm of colon: Secondary | ICD-10-CM

## 2016-05-12 MED ORDER — NA SULFATE-K SULFATE-MG SULF 17.5-3.13-1.6 GM/177ML PO SOLN
1.0000 | Freq: Once | ORAL | 0 refills | Status: AC
Start: 2016-05-12 — End: 2016-05-12

## 2016-05-12 NOTE — Progress Notes (Signed)
No egg or soy allergy. No anesthesia problems.  No home O2.  No diet meds.  

## 2016-05-13 ENCOUNTER — Encounter: Payer: Self-pay | Admitting: Internal Medicine

## 2016-05-19 ENCOUNTER — Ambulatory Visit (AMBULATORY_SURGERY_CENTER): Payer: BLUE CROSS/BLUE SHIELD | Admitting: Internal Medicine

## 2016-05-19 ENCOUNTER — Encounter: Payer: Self-pay | Admitting: Internal Medicine

## 2016-05-19 VITALS — BP 163/86 | HR 57 | Temp 98.2°F | Resp 11 | Ht 71.0 in | Wt 270.0 lb

## 2016-05-19 DIAGNOSIS — Z1211 Encounter for screening for malignant neoplasm of colon: Secondary | ICD-10-CM

## 2016-05-19 DIAGNOSIS — D123 Benign neoplasm of transverse colon: Secondary | ICD-10-CM

## 2016-05-19 MED ORDER — SODIUM CHLORIDE 0.9 % IV SOLN: 500.0000 mL | INTRAVENOUS | Status: DC

## 2016-05-19 NOTE — Progress Notes (Signed)
Pollyann Kennedy, RN attempted to ut IV in and patient would not relax, and told her that he was relaxing in a irritated tone.  I did not see a good site so the CRNA was notified to insert the IV.

## 2016-05-19 NOTE — Progress Notes (Signed)
Called to room to assist during endoscopic procedure.  Patient ID and intended procedure confirmed with present staff. Received instructions for my participation in the procedure from the performing physician.  

## 2016-05-19 NOTE — Progress Notes (Signed)
Assisted to bathroom to aide in expelling of air. Levsin give. Moderate amount  Of air expelled but abdomin remains distended. Ambulated around nursing station with no results. Patient assist back to bed rolled to right side with moderate amount of air expelled.

## 2016-05-19 NOTE — Patient Instructions (Signed)
Discharge instructions given. Handouts on polyps and hemorrhoids. Resume previous medications. YOU HAD AN ENDOSCOPIC PROCEDURE TODAY AT THE [facility]:   Refer to the procedure report that was given to you for any specific questions about what was found during the examination.  If the procedure report does not answer your questions, please call your gastroenterologist to clarify.  If you requested that your care partner not be given the details of your procedure findings, then the procedure report has been included in a sealed envelope for you to review at your convenience later.  YOU SHOULD EXPECT: Some feelings of bloating in the abdomen. Passage of more gas than usual.  Walking can help get rid of the air that was put into your GI tract during the procedure and reduce the bloating. If you had a lower endoscopy (such as a colonoscopy or flexible sigmoidoscopy) you may notice spotting of blood in your stool or on the toilet paper. If you underwent a bowel prep for your procedure, you may not have a normal bowel movement for a few days.  Please Note:  You might notice some irritation and congestion in your nose or some drainage.  This is from the oxygen used during your procedure.  There is no need for concern and it should clear up in a day or so.  SYMPTOMS TO REPORT IMMEDIATELY:   Following lower endoscopy (colonoscopy or flexible sigmoidoscopy):  Excessive amounts of blood in the stool  Significant tenderness or worsening of abdominal pains  Swelling of the abdomen that is new, acute  Fever of 100F or higher   For urgent or emergent issues, a gastroenterologist can be reached at any hour by calling [phone].   DIET: Your first meal following the procedure should be a small meal and then it is ok to progress to your normal diet. Heavy or fried foods are harder to digest and may make you feel nauseous or bloated.  Likewise, meals heavy in dairy and vegetables can increase  bloating.  Drink plenty of fluids but you should avoid alcoholic beverages for 24 hours.  ACTIVITY:  You should plan to take it easy for the rest of today and you should NOT DRIVE or use heavy machinery until tomorrow (because of the sedation medicines used during the test).    FOLLOW UP: Our staff will call the number listed on your records the next business day following your procedure to check on you and address any questions or concerns that you may have regarding the information given to you following your procedure. If we do not reach you, we will leave a message.  However, if you are feeling well and you are not experiencing any problems, there is no need to return our call.  We will assume that you have returned to your regular daily activities without incident.  If any biopsies were taken you will be contacted by phone or by letter within the next 1-3 weeks.  Please call us at [phone] if you have not heard about the biopsies in 3 weeks.    SIGNATURES/CONFIDENTIALITY: You and/or your care partner have signed paperwork which will be entered into your electronic medical record.  These signatures attest to the fact that that the information above on your After Visit Summary has been reviewed and is understood.  Full responsibility of the confidentiality of this discharge information lies with you and/or your care-partner. 

## 2016-05-19 NOTE — Op Note (Signed)
[place] Patient Name: Charles [name] Procedure Date: 05/19/2016 9:34 AM MRN: [id number] Endoscopist: Docia Chuck. Henrene Pastor , MD Age: 65 Referring MD:  Date of Birth: 08/10/1951 Gender: Male Account #: [id number] Procedure:                Colonoscopy,, with cold snare polypectomy x 1 Indications:              Screening for colorectal malignant neoplasm. Index                            examination August 2006 (normal) Medicines:                Monitored Anesthesia Care Procedure:                Pre-Anesthesia Assessment:                           - Prior to the procedure, a History and Physical                            was performed, and patient medications and                            allergies were reviewed. The patient's tolerance of                            previous anesthesia was also reviewed. The risks                            and benefits of the procedure and the sedation                            options and risks were discussed with the patient.                            All questions were answered, and informed consent                            was obtained. Prior Anticoagulants: The patient has                            taken no previous anticoagulant or antiplatelet                            agents. ASA Grade Assessment: II - A patient with                            mild systemic disease. After reviewing the risks                            and benefits, the patient was deemed in                            satisfactory condition to undergo the procedure.  After obtaining informed consent, the colonoscope                            was passed under direct vision. Throughout the                            procedure, the patient's blood pressure, pulse, and                            oxygen saturations were monitored continuously. The                            Model CF-HQ190L [id number]) scope was introduced          through the anus and advanced to the the cecum,                            identified by appendiceal orifice and ileocecal                            valve. The ileocecal valve, appendiceal orifice,                            and rectum were photographed. The quality of the                            bowel preparation was excellent. The colonoscopy                            was performed without difficulty. The patient                            tolerated the procedure well. The bowel preparation                            used was SUPREP. Scope In: 9:44:05 AM Scope Out: 9:58:29 AM Scope Withdrawal Time: 0 hours 12 minutes 33 seconds  Total Procedure Duration: 0 hours 14 minutes 24 seconds  Findings:                 A 4 mm polyp was found in the transverse colon. The                            polyp was removed with a cold snare. Resection and                            retrieval were complete.                           Internal hemorrhoids were found during retroflexion.                           The exam was otherwise without abnormality on  direct and retroflexion views. Complications:            No immediate complications. Estimated blood loss:                            None. Estimated Blood Loss:     Estimated blood loss: none. Impression:               - One 4 mm polyp in the transverse colon, removed                            with a cold snare. Resected and retrieved.                           - Internal hemorrhoids.                           - The examination was otherwise normal on direct                            and retroflexion views. Recommendation:           - Repeat colonoscopy in 5-10 years for surveillance.                           - Patient has a contact number available for                            emergencies. The signs and symptoms of potential                            delayed complications were discussed with the                             patient. Return to normal activities tomorrow.                            Written discharge instructions were provided to the                            patient.                           - Resume previous diet.                           - Continue present medications.                           - Await pathology results. Docia Chuck. Henrene Pastor, MD 05/19/2016 10:04:09 AM This report has been signed electronically.

## 2016-05-19 NOTE — Progress Notes (Signed)
Transferred to recovery awake alert  Report to cecilia rn

## 2016-05-20 ENCOUNTER — Telehealth: Payer: Self-pay

## 2016-05-20 NOTE — Telephone Encounter (Signed)
  Follow up Call-  Call back number 05/19/2016  Post procedure Call Back phone  # [phone]  Permission to leave phone message Yes  Some recent data might be hidden    Patient was called for follow up after her procedure on 05/19/2016. I spoke with the patients wife and she reports that Charles [name] has returned to his normal daily activities without any difficulty.

## 2016-05-22 ENCOUNTER — Encounter: Payer: Self-pay | Admitting: Internal Medicine

## 2016-09-16 ENCOUNTER — Telehealth: Payer: Self-pay | Admitting: *Deleted

## 2016-09-16 DIAGNOSIS — E349 Endocrine disorder, unspecified: Secondary | ICD-10-CM

## 2016-09-16 DIAGNOSIS — F528 Other sexual dysfunction not due to a substance or known physiological condition: Secondary | ICD-10-CM

## 2016-09-16 DIAGNOSIS — N529 Male erectile dysfunction, unspecified: Secondary | ICD-10-CM

## 2016-09-16 NOTE — Telephone Encounter (Signed)
Ok to fill, but he should have blood work done twice a year and is due for blood work. I ordered it - no need to fast.

## 2016-09-16 NOTE — Telephone Encounter (Signed)
Wife left msg on triage husband is needing refill on his Testosterone...Charles [name]

## 2016-09-17 MED ORDER — TESTOSTERONE 50 MG/5GM (1%) TD GEL: TRANSDERMAL | 5 refills | Status: DC

## 2016-09-17 NOTE — Telephone Encounter (Signed)
Called pt wife no answer LMOM w/MD response. F[facility]ed [facility].../lmb

## 2016-09-19 NOTE — Telephone Encounter (Signed)
Please advise 

## 2016-09-19 NOTE — Telephone Encounter (Signed)
Please call patient wife on her mobile. She is stating that they werent advised that androgel was changed to testim. She wants to make sure that it was intended to be changed. Please advise her.

## 2016-09-21 NOTE — Telephone Encounter (Signed)
I am not sure how it got changed - It should be the same.  It is their preference - we can switch it back.

## 2016-09-24 MED ORDER — TESTOSTERONE 20.25 MG/1.25GM (1.62%) TD GEL: TRANSDERMAL | 0 refills | Status: DC

## 2016-09-24 NOTE — Addendum Note (Signed)
Addended by: [name] on: 09/24/2016 09:09 PM   Modules accepted: Orders

## 2016-09-24 NOTE — Telephone Encounter (Signed)
rx printed

## 2016-09-24 NOTE — Telephone Encounter (Signed)
Please advise what dose to send in for the Androgel pt has been on different doses in the past.

## 2016-09-25 NOTE — Telephone Encounter (Signed)
RX faxed to [facility], pts wife made aware

## 2016-10-02 ENCOUNTER — Other Ambulatory Visit: Payer: Self-pay | Admitting: Internal Medicine

## 2016-10-09 ENCOUNTER — Other Ambulatory Visit: Payer: Self-pay | Admitting: Internal Medicine

## 2016-10-09 ENCOUNTER — Other Ambulatory Visit (INDEPENDENT_AMBULATORY_CARE_PROVIDER_SITE_OTHER): Payer: BLUE CROSS/BLUE SHIELD

## 2016-10-09 DIAGNOSIS — F528 Other sexual dysfunction not due to a substance or known physiological condition: Secondary | ICD-10-CM | POA: Diagnosis not present

## 2016-10-09 DIAGNOSIS — E038 Other specified hypothyroidism: Secondary | ICD-10-CM

## 2016-10-09 DIAGNOSIS — N529 Male erectile dysfunction, unspecified: Secondary | ICD-10-CM

## 2016-10-09 DIAGNOSIS — E349 Endocrine disorder, unspecified: Secondary | ICD-10-CM

## 2016-10-09 LAB — CBC WITH DIFFERENTIAL/PLATELET
BASOS ABS: 0 10*3/uL (ref 0.0–0.1)
Basophils Relative: 0.6 % (ref 0.0–3.0)
EOS PCT: 4.1 % (ref 0.0–5.0)
Eosinophils Absolute: 0.3 10*3/uL (ref 0.0–0.7)
HCT: 42.8 % (ref 39.0–52.0)
HEMOGLOBIN: 15 g/dL (ref 13.0–17.0)
LYMPHS ABS: 2.6 10*3/uL (ref 0.7–4.0)
Lymphocytes Relative: 32.7 % (ref 12.0–46.0)
MCHC: 35 g/dL (ref 30.0–36.0)
MCV: 91.6 fl (ref 78.0–100.0)
MONO ABS: 0.5 10*3/uL (ref 0.1–1.0)
MONOS PCT: 6.6 % (ref 3.0–12.0)
NEUTROS PCT: 56 % (ref 43.0–77.0)
Neutro Abs: 4.5 10*3/uL (ref 1.4–7.7)
Platelets: 275 10*3/uL (ref 150.0–400.0)
RBC: 4.68 Mil/uL (ref 4.22–5.81)
RDW: 13.6 % (ref 11.5–15.5)
WBC: 8 10*3/uL (ref 4.0–10.5)

## 2016-10-09 LAB — TSH: TSH: 1.23 u[IU]/mL (ref 0.35–4.50)

## 2016-10-10 LAB — PSA, TOTAL AND FREE
PSA, % Free: 25 % — ABNORMAL LOW (ref >25)
PSA, Free: 0.1 ng/mL
PSA, TOTAL: 0.4 ng/mL (ref <=4.0)

## 2016-10-11 LAB — TESTOSTERONE, FREE, TOTAL, SHBG
Sex Hormone Binding: 28.3 nmol/L (ref 19.3–76.4)
TESTOSTERONE FREE: 24.4 pg/mL — AB (ref 6.6–18.1)
TESTOSTERONE: 650 ng/dL (ref 264–916)

## 2016-11-20 ENCOUNTER — Other Ambulatory Visit: Payer: Self-pay | Admitting: Internal Medicine

## 2016-11-20 NOTE — Telephone Encounter (Signed)
RX faxed to [facility] 

## 2017-02-19 ENCOUNTER — Other Ambulatory Visit: Payer: Self-pay | Admitting: Internal Medicine

## 2017-02-19 NOTE — Telephone Encounter (Signed)
[place] controlled substance database checked.  Ok to fill medication.   Due for ofv with me next month - it has been one year - will need blood work at that visit.  Schedule CPE

## 2017-02-20 NOTE — Telephone Encounter (Signed)
Printed script, MD signed, [facility].../lmb

## 2017-03-30 ENCOUNTER — Other Ambulatory Visit: Payer: Self-pay | Admitting: Internal Medicine

## 2017-04-16 ENCOUNTER — Other Ambulatory Visit: Payer: Self-pay | Admitting: Internal Medicine

## 2017-05-20 ENCOUNTER — Other Ambulatory Visit: Payer: Self-pay | Admitting: Internal Medicine

## 2017-06-01 ENCOUNTER — Telehealth: Payer: Self-pay | Admitting: Internal Medicine

## 2017-06-01 NOTE — Telephone Encounter (Signed)
Pl advise on refill for the Andro gel pt has made appt for 06/11/17...Charles [name]

## 2017-06-01 NOTE — Telephone Encounter (Signed)
Pt wife called regarding this,  CPE set up for 8/23 w/ Burns  [facility] [address]

## 2017-06-01 NOTE — Telephone Encounter (Signed)
Ok to refill 

## 2017-06-02 MED ORDER — TESTOSTERONE 20.25 MG/ACT (1.62%) TD GEL: TRANSDERMAL | 0 refills | Status: DC

## 2017-06-02 NOTE — Telephone Encounter (Signed)
[facility].../lmb 

## 2017-06-02 NOTE — Addendum Note (Signed)
Addended by: Earnstine Regal on: 06/02/2017 08:49 AM   Modules accepted: Orders

## 2017-06-11 ENCOUNTER — Encounter: Payer: BLUE CROSS/BLUE SHIELD | Admitting: Internal Medicine
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
## 2017-07-08 ENCOUNTER — Ambulatory Visit (INDEPENDENT_AMBULATORY_CARE_PROVIDER_SITE_OTHER): Payer: BLUE CROSS/BLUE SHIELD | Admitting: Internal Medicine

## 2017-07-08 ENCOUNTER — Encounter: Payer: Self-pay | Admitting: Internal Medicine

## 2017-07-08 ENCOUNTER — Other Ambulatory Visit (INDEPENDENT_AMBULATORY_CARE_PROVIDER_SITE_OTHER): Payer: BLUE CROSS/BLUE SHIELD

## 2017-07-08 VITALS — BP 160/94 | HR 67 | Temp 98.8°F | Resp 16 | Ht 71.0 in | Wt 268.0 lb

## 2017-07-08 DIAGNOSIS — I10 Essential (primary) hypertension: Secondary | ICD-10-CM

## 2017-07-08 DIAGNOSIS — Z0000 Encounter for general adult medical examination without abnormal findings: Secondary | ICD-10-CM | POA: Diagnosis not present

## 2017-07-08 DIAGNOSIS — E349 Endocrine disorder, unspecified: Secondary | ICD-10-CM

## 2017-07-08 DIAGNOSIS — K429 Umbilical hernia without obstruction or gangrene: Secondary | ICD-10-CM | POA: Diagnosis not present

## 2017-07-08 DIAGNOSIS — Z0001 Encounter for general adult medical examination with abnormal findings: Secondary | ICD-10-CM

## 2017-07-08 DIAGNOSIS — R739 Hyperglycemia, unspecified: Secondary | ICD-10-CM | POA: Diagnosis not present

## 2017-07-08 DIAGNOSIS — E038 Other specified hypothyroidism: Secondary | ICD-10-CM | POA: Diagnosis not present

## 2017-07-08 DIAGNOSIS — Z23 Encounter for immunization: Secondary | ICD-10-CM

## 2017-07-08 DIAGNOSIS — E785 Hyperlipidemia, unspecified: Secondary | ICD-10-CM

## 2017-07-08 LAB — CBC WITH DIFFERENTIAL/PLATELET
BASOS ABS: 0.1 10*3/uL (ref 0.0–0.1)
Basophils Relative: 0.8 % (ref 0.0–3.0)
EOS ABS: 0.2 10*3/uL (ref 0.0–0.7)
Eosinophils Relative: 3 % (ref 0.0–5.0)
HEMATOCRIT: 45.9 % (ref 39.0–52.0)
Hemoglobin: 15.6 g/dL (ref 13.0–17.0)
LYMPHS PCT: 28.8 % (ref 12.0–46.0)
Lymphs Abs: 2.2 10*3/uL (ref 0.7–4.0)
MCHC: 34.1 g/dL (ref 30.0–36.0)
MCV: 93.1 fl (ref 78.0–100.0)
Monocytes Absolute: 0.5 10*3/uL (ref 0.1–1.0)
Monocytes Relative: 6.7 % (ref 3.0–12.0)
NEUTROS ABS: 4.7 10*3/uL (ref 1.4–7.7)
Neutrophils Relative %: 60.7 % (ref 43.0–77.0)
PLATELETS: 270 10*3/uL (ref 150.0–400.0)
RBC: 4.93 Mil/uL (ref 4.22–5.81)
RDW: 13.9 % (ref 11.5–15.5)
WBC: 7.8 10*3/uL (ref 4.0–10.5)

## 2017-07-08 LAB — LIPID PANEL
Cholesterol: 187 mg/dL (ref 0–200)
HDL: 44.2 mg/dL (ref >39.00)
LDL CALC: 114 mg/dL — AB (ref 0–99)
NONHDL: 142.32
Total CHOL/HDL Ratio: 4
Triglycerides: 141 mg/dL (ref 0.0–149.0)
VLDL: 28.2 mg/dL (ref 0.0–40.0)

## 2017-07-08 LAB — COMPREHENSIVE METABOLIC PANEL
ALT: 23 U/L (ref 0–53)
AST: 27 U/L (ref 0–37)
Albumin: 4.4 g/dL (ref 3.5–5.2)
Alkaline Phosphatase: 51 U/L (ref 39–117)
BILIRUBIN TOTAL: 0.7 mg/dL (ref 0.2–1.2)
BUN: 19 mg/dL (ref 6–23)
CALCIUM: 9.5 mg/dL (ref 8.4–10.5)
CO2: 26 meq/L (ref 19–32)
CREATININE: 1.25 mg/dL (ref 0.40–1.50)
Chloride: 102 mEq/L (ref 96–112)
GFR: 61.63 mL/min (ref >60.00)
GLUCOSE: 103 mg/dL — AB (ref 70–99)
Potassium: 3.9 mEq/L (ref 3.5–5.1)
Sodium: 136 mEq/L (ref 135–145)
Total Protein: 7.6 g/dL (ref 6.0–8.3)

## 2017-07-08 LAB — TSH: TSH: 2.92 u[IU]/mL (ref 0.35–4.50)

## 2017-07-08 LAB — HEMOGLOBIN A1C: HEMOGLOBIN A1C: 5.8 % (ref 4.6–6.5)

## 2017-07-08 MED ORDER — TESTOSTERONE 20.25 MG/ACT (1.62%) TD GEL
TRANSDERMAL | 0 refills | Status: DC
Start: 2017-07-08 — End: 2017-08-19

## 2017-07-08 NOTE — Assessment & Plan Note (Signed)
Check A1c Continue increased activity Advised weight loss Stressed low sugar/carbohydrate diet

## 2017-07-08 NOTE — Assessment & Plan Note (Signed)
Using testosterone as prescribed and overall feels well Check testosterone level, PSA, CBC Follow-up in 6 months

## 2017-07-08 NOTE — Assessment & Plan Note (Signed)
Umbilical hernia intermittently tender and painful Interested in possible surgery Referred to general surgery

## 2017-07-08 NOTE — Assessment & Plan Note (Signed)
Elevated here - only taking metoprolol once a day - stressed taking it twice a day Did not take medication today Start monitoring BP at home - goal < 140/90 Work on weight loss cmp

## 2017-07-08 NOTE — Patient Instructions (Addendum)
Monitor your BP at home - it should be less than 140/90.  Test(s) ordered today. Your results will be released to [place] (or called to you) after review, usually within 72hours after test completion. If any changes need to be made, you will be notified at that same time.  All other Health Maintenance issues reviewed.   All recommended immunizations and age-appropriate screenings are up-to-date or discussed.  Flu immunization administered today.   Medications reviewed and updated.  No changes recommended at this time.  Your prescription(s) have been submitted to your pharmacy. Please take as directed and contact our office if you believe you are having problem(s) with the medication(s).  A referral was ordered for surgery for her hernia.   Please followup in 6 months    Health Maintenance, Male A healthy lifestyle and preventive care is important for your health and wellness. Ask your health care provider about what schedule of regular examinations is right for you. What should I know about weight and diet? Eat a Healthy Diet  Eat plenty of vegetables, fruits, whole grains, low-fat dairy products, and lean protein.  Do not eat a lot of foods high in solid fats, added sugars, or salt.  Maintain a Healthy Weight Regular exercise can help you achieve or maintain a healthy weight. You should:  Do at least 150 minutes of exercise each week. The exercise should increase your heart rate and make you sweat (moderate-intensity exercise).  Do strength-training exercises at least twice a week.  Watch Your Levels of Cholesterol and Blood Lipids  Have your blood tested for lipids and cholesterol every 5 years starting at 65 years of age. If you are at high risk for heart disease, you should start having your blood tested when you are 65 years old. You may need to have your cholesterol levels checked more often if: ? Your lipid or cholesterol levels are high. ? You are older than 65 years of  age. ? You are at high risk for heart disease.  What should I know about cancer screening? Many types of cancers can be detected early and may often be prevented. Lung Cancer  You should be screened every year for lung cancer if: ? You are a current smoker who has smoked for at least 30 years. ? You are a former smoker who has quit within the past 15 years.  Talk to your health care provider about your screening options, when you should start screening, and how often you should be screened.  Colorectal Cancer  Routine colorectal cancer screening usually begins at 65 years of age and should be repeated every 5-10 years until you are 65 years old. You may need to be screened more often if early forms of precancerous polyps or small growths are found. Your health care provider may recommend screening at an earlier age if you have risk factors for colon cancer.  Your health care provider may recommend using home test kits to check for hidden blood in the stool.  A small camera at the end of a tube can be used to examine your colon (sigmoidoscopy or colonoscopy). This checks for the earliest forms of colorectal cancer.  Prostate and Testicular Cancer  Depending on your age and overall health, your health care provider may do certain tests to screen for prostate and testicular cancer.  Talk to your health care provider about any symptoms or concerns you have about testicular or prostate cancer.  Skin Cancer  Check your skin from head  to toe regularly.  Tell your health care provider about any new moles or changes in moles, especially if: ? There is a change in a mole's size, shape, or color. ? You have a mole that is larger than a pencil eraser.  Always use sunscreen. Apply sunscreen liberally and repeat throughout the day.  Protect yourself by wearing long sleeves, pants, a wide-brimmed hat, and sunglasses when outside.  What should I know about heart disease, diabetes, and high  blood pressure?  If you are 71-22 years of age, have your blood pressure checked every 3-5 years. If you are 31 years of age or older, have your blood pressure checked every year. You should have your blood pressure measured twice-once when you are at a hospital or clinic, and once when you are not at a hospital or clinic. Record the average of the two measurements. To check your blood pressure when you are not at a hospital or clinic, you can use: ? An automated blood pressure machine at a pharmacy. ? A home blood pressure monitor.  Talk to your health care provider about your target blood pressure.  If you are between 66-60 years old, ask your health care provider if you should take aspirin to prevent heart disease.  Have regular diabetes screenings by checking your fasting blood sugar level. ? If you are at a normal weight and have a low risk for diabetes, have this test once every three years after the age of 21. ? If you are overweight and have a high risk for diabetes, consider being tested at a younger age or more often.  A one-time screening for abdominal aortic aneurysm (AAA) by ultrasound is recommended for men aged 69-75 years who are current or former smokers. What should I know about preventing infection? Hepatitis B If you have a higher risk for hepatitis B, you should be screened for this virus. Talk with your health care provider to find out if you are at risk for hepatitis B infection. Hepatitis C Blood testing is recommended for:  Everyone born from [birth year] through 1965.  Anyone with known risk factors for hepatitis C.  Sexually Transmitted Diseases (STDs)  You should be screened each year for STDs including gonorrhea and chlamydia if: ? You are sexually active and are younger than 65 years of age. ? You are older than 65 years of age and your health care provider tells you that you are at risk for this type of infection. ? Your sexual activity has changed since you were  last screened and you are at an increased risk for chlamydia or gonorrhea. Ask your health care provider if you are at risk.  Talk with your health care provider about whether you are at high risk of being infected with HIV. Your health care provider may recommend a prescription medicine to help prevent HIV infection.  What else can I do?  Schedule regular health, dental, and eye exams.  Stay current with your vaccines (immunizations).  Do not use any tobacco products, such as cigarettes, chewing tobacco, and e-cigarettes. If you need help quitting, ask your health care provider.  Limit alcohol intake to no more than 2 drinks per day. One drink equals 12 ounces of beer, 5 ounces of wine, or 1 ounces of hard liquor.  Do not use street drugs.  Do not share needles.  Ask your health care provider for help if you need support or information about quitting drugs.  Tell your health care provider if  you often feel depressed.  Tell your health care provider if you have ever been abused or do not feel safe at home. This information is not intended to replace advice given to you by your health care provider. Make sure you discuss any questions you have with your health care provider. Document Released: 04/03/2008 Document Revised: 06/04/2016 Document Reviewed: 07/10/2015 Elsevier Interactive Patient Education  [employer].

## 2017-07-08 NOTE — Assessment & Plan Note (Signed)
Check tsh  Titrate med dose if needed  

## 2017-07-08 NOTE — Assessment & Plan Note (Signed)
Not on medication. Check lipid panel.  

## 2017-07-09 LAB — PSA, TOTAL AND FREE
PSA, % FREE: 40 % (ref >25)
PSA, FREE: 0.2 ng/mL
PSA, Total: 0.5 ng/mL (ref <=4.0)

## 2017-07-09 LAB — VARICELLA ZOSTER ANTIBODY, IGG: Varicella IgG: 1602 index

## 2017-07-10 ENCOUNTER — Other Ambulatory Visit: Payer: Self-pay | Admitting: Internal Medicine

## 2017-07-10 LAB — TESTOSTERONE, FREE, TOTAL, SHBG
Sex Hormone Binding: 32.6 nmol/L (ref 19.3–76.4)
TESTOSTERONE: 353 ng/dL (ref 264–916)
Testosterone, Free: 11.7 pg/mL (ref 6.6–18.1)

## 2017-07-12 ENCOUNTER — Other Ambulatory Visit: Payer: Self-pay | Admitting: Internal Medicine

## 2017-07-13 ENCOUNTER — Telehealth: Payer: Self-pay

## 2017-07-13 NOTE — Telephone Encounter (Signed)
Advised patient of dr burns note/lab results---I have added patient to shingrix wait list and also mailed copy of labs per patient request

## 2017-07-13 NOTE — Telephone Encounter (Signed)
-----   Message from [name], MD sent at 07/11/2017  4:27 PM EDT ----- Testosterone level was ok - 353.  psa is normal.  Chicken pox antibody is positive so he has been exposed to chicken pox in the past even though he did not have th disease - that means he is at risk of getting shingles and should consider getting the vaccine.  Sugars stable in prediabetic range.  Thyroid function, kidney function, liver tests and blood counts normal.  Cholesterol is good.

## 2017-08-17 ENCOUNTER — Other Ambulatory Visit: Payer: Self-pay | Admitting: Internal Medicine

## 2017-08-19 ENCOUNTER — Ambulatory Visit: Payer: Self-pay | Admitting: Surgery

## 2017-08-19 ENCOUNTER — Other Ambulatory Visit: Payer: Self-pay | Admitting: Internal Medicine

## 2017-08-19 NOTE — H&P (Signed)
Charles [name] 08/19/2017 9:48 AM Location: [facility] Patient #: [id number] DOB: [birth year] Married / Language: English / Race: White Male  History of Present Illness (Fareedah Mahler A. Kae Heller MD; 08/19/2017 10:07 AM) Patient words: This is a very pleasant gentleman presents with umbilical hernia which has been present for many years as well as new right groin pain and concern for hernia. He does not think the umbilical hernia has changed in size since he first noticed it but it does cause him some embarrassment from its appearance. No complaints of nausea or vomiting abdominal bloating or pain. He complains of right groin pain and the sensation that his testicle is riding up into his groin and he has to push down. He has not noticed any mass in the region. The pain is related to movement. No prior abdominal surgeries. 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  The patient is a 66 year old male.   Diagnostic Studies History Benjiman Core, [place]; 08/19/2017 9:48 AM) Colonoscopy 1-5 years ago  Allergies Benjiman Core, CMA; 08/19/2017 9:53 AM) No Known Drug Allergies 08/19/2017  Medication History (Armen Eulas Post, CMA; 08/19/2017 9:54 AM) AndroGel Pump (20.25 MG/ACT(1.62%) Gel, Transdermal) Active. Levothyroxine Sodium (175MCG Tablet, Oral) Active. Metoprolol Tartrate (50MG  Tablet, Oral) Active. Omeprazole (20MG  Capsule DR, Oral) Active. Cialis (20MG  Tablet, Oral) Active. Aspirin (81MG  Tablet, Oral) Active. Medications Reconciled  Social History Benjiman Core, CMA; 08/19/2017 9:48 AM) Alcohol use Remotely quit alcohol use. Caffeine use Coffee, Tea. Illicit drug use Uses socially only. Tobacco use Former smoker.  Family History Benjiman Core, [place]; 08/19/2017 9:48 AM) Breast Cancer Mother, Sister. Cerebrovascular Accident Sister. Diabetes Mellitus Sister. Hypertension Sister. Prostate Cancer Father. Thyroid problems Sister.  Other Problems Benjiman Core, CMA; 08/19/2017 9:48 AM) Arthritis Gastroesophageal Reflux Disease High blood pressure Thyroid Disease Umbilical Hernia Repair     Review of Systems (Analei Whinery A. Reiley Keisler MD; 08/19/2017 10:07 AM) General Not Present- Appetite Loss, Chills, Fatigue, Fever, Night Sweats, Weight Gain and Weight Loss. Skin Not Present- Change in Wart/Mole, Dryness, Hives, Jaundice, New Lesions, Non-Healing Wounds, Rash and Ulcer. HEENT Not Present- Earache, Hearing Loss, Hoarseness, Nose Bleed, Oral Ulcers, Ringing in the Ears, Seasonal Allergies, Sinus Pain, Sore Throat, Visual Disturbances, Wears glasses/contact lenses and Yellow Eyes. Respiratory Present- Snoring. Not Present- Bloody sputum, Chronic Cough, Difficulty Breathing and Wheezing. Breast Not Present- Breast Mass, Breast Pain, Nipple Discharge and Skin Changes. Gastrointestinal Not Present- Abdominal Pain, Bloating, Bloody Stool, Change in Bowel Habits, Chronic diarrhea, Constipation, Difficulty Swallowing, Excessive gas, Gets full quickly at meals, Hemorrhoids, Indigestion, Nausea, Rectal Pain and Vomiting. Male Genitourinary Present- Frequency. Not Present- Blood in Urine, Change in Urinary Stream, Impotence, Nocturia, Painful Urination, Urgency and Urine Leakage. All other systems negative  Vitals (Armen Glenn CMA; 08/19/2017 9:53 AM) 08/19/2017 9:50 AM Weight: 268.5 lb Height: 71in Body Surface Area: 2.39 m Body Mass Index: 37.45 kg/m  Temp.: 98.3F  Pulse: 64 (Regular)  P.OX: 93% (Room air) BP: 146/92 (Sitting, Left Arm, Standard)      Physical Exam (Ameya Vowell A. Kae Heller MD; 08/19/2017 10:09 AM)  General Note: alert and well appearing  Integumentary Note: Warm and dry  Head and Neck Note: No mass or thyromegaly  Eye Note: No scleral icterus, extraocular motions intact  Chest and Lung Exam Note: Unlabored respirations, clear bilaterally  Cardiovascular Note: Regular rate and rhythm, palpable  distal pulses  Abdomen Note: Obese, nontender nondistended. There is a partially reducible umbilical hernia with incarcerated fat. The fascial defect is not palpable.  There is laxity in bilateral groins but no obvious hernia or mass  Neurologic Note: Grossly intact, normal gait  Neuropsychiatric Note: Normal mood and affect, appropriate insight  Musculoskeletal Note: Strength symmetrical throughout, no deformity    Assessment & Plan (Chabely Norby A. Birdell Frasier MD; 08/19/2017 10:12 AM)  Nira Conn UMBILICAL HERNIA (X03.8) Story: Has been present for many years, does not really bother him but he would like to get it repaired before his issues. Given his groin pain and concern for inguinal hernia, I recommend laparoscopic approach so that we can inspect the bilateral groins and repair these as well if indicated. We discussed risks of bleeding, infection, pain, scarring, intra-abdominal injury, hernia recurrence, chronic groin pain/failure to resolve symptoms. We also discussed risks of general anesthesia including heart attack, blood clots, stroke, pneumonia and death. He expressed understanding. He would like to proceed with surgery in January.  INGUINODYNIA, RIGHT (R10.31)

## 2017-08-26 ENCOUNTER — Other Ambulatory Visit: Payer: Self-pay | Admitting: Internal Medicine

## 2017-08-26 NOTE — Telephone Encounter (Signed)
[place] Controlled Substance Database checked. Last filled on 07/09/17

## 2017-08-27 NOTE — Telephone Encounter (Signed)
RX faxed to [facility] 

## 2017-08-28 ENCOUNTER — Other Ambulatory Visit: Payer: Self-pay | Admitting: Internal Medicine

## 2017-11-03 NOTE — Pre-Procedure Instructions (Signed)
Charles [name]  11/03/2017      [id number] [place], [place] - [address] [place] [address] [address] Phone: [phone] Fax: [phone]    Your procedure is scheduled on November 06, 2017.  Report to [facility] Admitting at 1000 AM.  Call this number if you have problems the morning of surgery:  [phone]   Remember:  Do not eat food or drink liquids after midnight.  Take these medicines the morning of surgery with A SIP OF WATER metoprolol tartrate (lopressor), levothyroxine (synthroid), omeprazole (prilosec).   Beginning now, STOP taking any Aspirin (unless otherwise instructed by your surgeon), Aleve, Naproxen, Ibuprofen, Motrin, Advil, Goody's, BC's, all herbal medications, fish oil, and all vitamins  Continue all other medications as instructed by your physician except follow the above medication instructions before surgery   Do not wear jewelry.  Do not wear lotions, powders, or colognes, or deodorant.  Men may shave face and neck.  Do not bring valuables to the hospital.  [facility] is not responsible for any belongings or valuables.  Contacts, dentures or bridgework may not be worn into surgery.  Leave your suitcase in the car.  After surgery it may be brought to your room.  For patients admitted to the hospital, discharge time will be determined by your treatment team.  Patients discharged the day of surgery will not be allowed to drive home.   Special instructions:   [facility]- Preparing For Surgery  Before surgery, you can play an important role. Because skin is not sterile, your skin needs to be as free of germs as possible. You can reduce the number of germs on your skin by washing with CHG (chlorahexidine gluconate) Soap before surgery.  CHG is an antiseptic cleaner which kills germs and bonds with the skin to continue killing germs even after washing.  Please do not use if you  have an allergy to CHG or antibacterial soaps. If your skin becomes reddened/irritated stop using the CHG.  Do not shave (including legs and underarms) for at least 48 hours prior to first CHG shower. It is OK to shave your face.  Please follow these instructions carefully.   1. Shower the NIGHT BEFORE SURGERY and the MORNING OF SURGERY with CHG.   2. If you chose to wash your hair, wash your hair first as usual with your normal shampoo.  3. After you shampoo, rinse your hair and body thoroughly to remove the shampoo.  4. Use CHG as you would any other liquid soap. You can apply CHG directly to the skin and wash gently with a scrungie or a clean washcloth.   5. Apply the CHG Soap to your body ONLY FROM THE NECK DOWN.  Do not use on open wounds or open sores. Avoid contact with your eyes, ears, mouth and genitals (private parts). Wash Face and genitals (private parts)  with your normal soap.  6. Wash thoroughly, paying special attention to the area where your surgery will be performed.  7. Thoroughly rinse your body with warm water from the neck down.  8. DO NOT shower/wash with your normal soap after using and rinsing off the CHG Soap.  9. Pat yourself dry with a CLEAN TOWEL.  10. Wear CLEAN PAJAMAS to bed the night before surgery, wear comfortable clothes the morning of surgery  11. Place CLEAN SHEETS on your bed the night of your first shower and  DO NOT SLEEP WITH PETS.   Day of Surgery: Do not apply any deodorants/lotions. Please wear clean clothes to the hospital/surgery center.    Please read over the following fact sheets that you were given. Pain Booklet, Coughing and Deep Breathing and Surgical Site Infection Prevention

## 2017-11-04 ENCOUNTER — Other Ambulatory Visit: Payer: Self-pay

## 2017-11-04 ENCOUNTER — Encounter (HOSPITAL_COMMUNITY)
Admission: RE | Admit: 2017-11-04 | Discharge: 2017-11-04 | Disposition: A | Discharge status: Home / Self Care | Payer: Medicare HMO | Source: Ambulatory Visit | Attending: Surgery | Admitting: Surgery

## 2017-11-04 ENCOUNTER — Encounter (HOSPITAL_COMMUNITY): Payer: Self-pay

## 2017-11-04 DIAGNOSIS — K4090 Unilateral inguinal hernia, without obstruction or gangrene, not specified as recurrent: Secondary | ICD-10-CM | POA: Diagnosis not present

## 2017-11-04 DIAGNOSIS — Z7982 Long term (current) use of aspirin: Secondary | ICD-10-CM | POA: Diagnosis not present

## 2017-11-04 DIAGNOSIS — K219 Gastro-esophageal reflux disease without esophagitis: Secondary | ICD-10-CM | POA: Diagnosis not present

## 2017-11-04 DIAGNOSIS — Z87891 Personal history of nicotine dependence: Secondary | ICD-10-CM | POA: Diagnosis not present

## 2017-11-04 DIAGNOSIS — Z79899 Other long term (current) drug therapy: Secondary | ICD-10-CM | POA: Diagnosis not present

## 2017-11-04 DIAGNOSIS — K420 Umbilical hernia with obstruction, without gangrene: Secondary | ICD-10-CM | POA: Diagnosis not present

## 2017-11-04 DIAGNOSIS — I10 Essential (primary) hypertension: Secondary | ICD-10-CM | POA: Diagnosis not present

## 2017-11-04 HISTORY — DX: Personal history of urinary calculi: Z87.442

## 2017-11-04 HISTORY — DX: Unspecified osteoarthritis, unspecified site: M19.90

## 2017-11-04 LAB — CBC WITH DIFFERENTIAL/PLATELET
Basophils Absolute: 0.1 10*3/uL (ref 0.0–0.1)
Basophils Relative: 1 %
EOS PCT: 4 %
Eosinophils Absolute: 0.3 10*3/uL (ref 0.0–0.7)
HCT: 45 % (ref 39.0–52.0)
Hemoglobin: 15.9 g/dL (ref 13.0–17.0)
LYMPHS ABS: 2.3 10*3/uL (ref 0.7–4.0)
Lymphocytes Relative: 32 %
MCH: 32.4 pg (ref 26.0–34.0)
MCHC: 35.3 g/dL (ref 30.0–36.0)
MCV: 91.6 fL (ref 78.0–100.0)
MONO ABS: 0.4 10*3/uL (ref 0.1–1.0)
Monocytes Relative: 6 %
Neutro Abs: 4 10*3/uL (ref 1.7–7.7)
Neutrophils Relative %: 57 %
PLATELETS: 230 10*3/uL (ref 150–400)
RBC: 4.91 MIL/uL (ref 4.22–5.81)
RDW: 12.7 % (ref 11.5–15.5)
WBC: 7.1 10*3/uL (ref 4.0–10.5)

## 2017-11-04 LAB — BASIC METABOLIC PANEL
Anion gap: 10 (ref 5–15)
BUN: 18 mg/dL (ref 6–20)
CHLORIDE: 107 mmol/L (ref 101–111)
CO2: 21 mmol/L — AB (ref 22–32)
Calcium: 9.1 mg/dL (ref 8.9–10.3)
Creatinine, Ser: 1.21 mg/dL (ref 0.61–1.24)
GFR calc Af Amer: >60 mL/min (ref >60)
GFR calc non Af Amer: >60 mL/min (ref >60)
GLUCOSE: 118 mg/dL — AB (ref 65–99)
Potassium: 4.1 mmol/L (ref 3.5–5.1)
Sodium: 138 mmol/L (ref 135–145)

## 2017-11-04 MED ORDER — CHLORHEXIDINE GLUCONATE 4 % EX LIQD: 60.0000 mL | Freq: Once | CUTANEOUS | Status: DC

## 2017-11-04 NOTE — Progress Notes (Addendum)
   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

## 2017-11-04 NOTE — Progress Notes (Signed)
PCP: Billey Gosling, MD  Cardiologist: pt denies  EKG: pt denies past year  Stress test: pt denies ever  ECHO: pt denies ever  Cardiac Cath: pt denies ever  Chest x-ray: pt denies past year, no recent respiratory infections/complications

## 2017-11-05 MED ORDER — DEXTROSE 5 % IV SOLN
3.0000 g | INTRAVENOUS | Status: AC
  Administered 2017-11-06: 3 g via INTRAVENOUS
  Filled 2017-11-05: qty 3

## 2017-11-06 ENCOUNTER — Encounter (HOSPITAL_COMMUNITY): Payer: Self-pay | Admitting: *Deleted

## 2017-11-06 ENCOUNTER — Ambulatory Visit (HOSPITAL_COMMUNITY)
Admission: RE | Admit: 2017-11-06 | Discharge: 2017-11-06 | Disposition: A | Discharge status: Home / Self Care | Payer: Medicare HMO | Source: Ambulatory Visit | Attending: Surgery | Admitting: Surgery

## 2017-11-06 ENCOUNTER — Ambulatory Visit (HOSPITAL_COMMUNITY): Payer: Medicare HMO | Admitting: Certified Registered Nurse Anesthetist

## 2017-11-06 ENCOUNTER — Encounter (HOSPITAL_COMMUNITY)
Admission: RE | Disposition: A | Discharge status: Home / Self Care | Payer: Self-pay | Source: Ambulatory Visit | Attending: Surgery

## 2017-11-06 DIAGNOSIS — K219 Gastro-esophageal reflux disease without esophagitis: Secondary | ICD-10-CM | POA: Insufficient documentation

## 2017-11-06 DIAGNOSIS — Z79899 Other long term (current) drug therapy: Secondary | ICD-10-CM | POA: Diagnosis not present

## 2017-11-06 DIAGNOSIS — Z87891 Personal history of nicotine dependence: Secondary | ICD-10-CM | POA: Insufficient documentation

## 2017-11-06 DIAGNOSIS — Z7982 Long term (current) use of aspirin: Secondary | ICD-10-CM | POA: Insufficient documentation

## 2017-11-06 DIAGNOSIS — K4030 Unilateral inguinal hernia, with obstruction, without gangrene, not specified as recurrent: Secondary | ICD-10-CM | POA: Diagnosis not present

## 2017-11-06 DIAGNOSIS — K429 Umbilical hernia without obstruction or gangrene: Secondary | ICD-10-CM | POA: Diagnosis not present

## 2017-11-06 DIAGNOSIS — K4090 Unilateral inguinal hernia, without obstruction or gangrene, not specified as recurrent: Secondary | ICD-10-CM | POA: Insufficient documentation

## 2017-11-06 DIAGNOSIS — I10 Essential (primary) hypertension: Secondary | ICD-10-CM | POA: Diagnosis not present

## 2017-11-06 DIAGNOSIS — K420 Umbilical hernia with obstruction, without gangrene: Secondary | ICD-10-CM | POA: Insufficient documentation

## 2017-11-06 DIAGNOSIS — E039 Hypothyroidism, unspecified: Secondary | ICD-10-CM | POA: Diagnosis not present

## 2017-11-06 HISTORY — PX: INSERTION OF MESH: SHX5868

## 2017-11-06 HISTORY — PX: UMBILICAL HERNIA REPAIR: SHX196

## 2017-11-06 HISTORY — PX: INGUINAL HERNIA REPAIR: SHX194

## 2017-11-06 SURGERY — REPAIR, HERNIA, UMBILICAL, LAPAROSCOPIC
Anesthesia: General | Site: Inguinal | Laterality: Right

## 2017-11-06 MED ORDER — STERILE WATER FOR IRRIGATION IR SOLN
Status: DC | PRN
  Administered 2017-11-06: 1000 mL

## 2017-11-06 MED ORDER — SUGAMMADEX SODIUM 500 MG/5ML IV SOLN
INTRAVENOUS | Status: AC
  Filled 2017-11-06: qty 5

## 2017-11-06 MED ORDER — ROCURONIUM BROMIDE 10 MG/ML (PF) SYRINGE
PREFILLED_SYRINGE | INTRAVENOUS | Status: DC | PRN
  Administered 2017-11-06: 20 mg via INTRAVENOUS
  Administered 2017-11-06 (×2): 50 mg via INTRAVENOUS

## 2017-11-06 MED ORDER — BUPIVACAINE-EPINEPHRINE (PF) 0.5% -1:200000 IJ SOLN
INTRAMUSCULAR | Status: AC
  Filled 2017-11-06: qty 30

## 2017-11-06 MED ORDER — ACETAMINOPHEN 325 MG PO TABS
650.0000 mg | ORAL_TABLET | ORAL | Status: DC | PRN
  Administered 2017-11-06: 650 mg via ORAL

## 2017-11-06 MED ORDER — CELECOXIB 200 MG PO CAPS
ORAL_CAPSULE | ORAL | Status: AC
  Administered 2017-11-06: 200 mg via ORAL
  Filled 2017-11-06: qty 1

## 2017-11-06 MED ORDER — SODIUM CHLORIDE 0.9% FLUSH: 3.0000 mL | INTRAVENOUS | Status: DC | PRN

## 2017-11-06 MED ORDER — ACETAMINOPHEN 500 MG PO TABS
ORAL_TABLET | ORAL | Status: AC
  Administered 2017-11-06: 1000 mg via ORAL
  Filled 2017-11-06: qty 2

## 2017-11-06 MED ORDER — LABETALOL HCL 5 MG/ML IV SOLN
INTRAVENOUS | Status: DC | PRN
  Administered 2017-11-06: 5 mg via INTRAVENOUS

## 2017-11-06 MED ORDER — PROPOFOL 10 MG/ML IV BOLUS
INTRAVENOUS | Status: DC | PRN
  Administered 2017-11-06 (×2): 200 mg via INTRAVENOUS

## 2017-11-06 MED ORDER — GABAPENTIN 300 MG PO CAPS
300.0000 mg | ORAL_CAPSULE | ORAL | Status: AC
Start: 2017-11-06 — End: 2017-11-06
  Administered 2017-11-06: 300 mg via ORAL

## 2017-11-06 MED ORDER — SODIUM CHLORIDE 0.9 % IV SOLN: 250.0000 mL | INTRAVENOUS | Status: DC | PRN

## 2017-11-06 MED ORDER — SUGAMMADEX SODIUM 200 MG/2ML IV SOLN
INTRAVENOUS | Status: AC
  Filled 2017-11-06: qty 2

## 2017-11-06 MED ORDER — OXYCODONE HCL 5 MG PO TABS
ORAL_TABLET | ORAL | Status: AC
  Administered 2017-11-06: 10 mg via ORAL
  Filled 2017-11-06: qty 2

## 2017-11-06 MED ORDER — LACTATED RINGERS IV SOLN
INTRAVENOUS | Status: DC | PRN
  Administered 2017-11-06: 15:00:00 via INTRAVENOUS

## 2017-11-06 MED ORDER — GABAPENTIN 300 MG PO CAPS
ORAL_CAPSULE | ORAL | Status: AC
  Administered 2017-11-06: 300 mg via ORAL
  Filled 2017-11-06: qty 1

## 2017-11-06 MED ORDER — LACTATED RINGERS IV SOLN
Freq: Once | INTRAVENOUS | Status: AC
  Administered 2017-11-06: 11:00:00 via INTRAVENOUS

## 2017-11-06 MED ORDER — DEXAMETHASONE SODIUM PHOSPHATE 10 MG/ML IJ SOLN
INTRAMUSCULAR | Status: DC | PRN
  Administered 2017-11-06: 10 mg via INTRAVENOUS

## 2017-11-06 MED ORDER — SUGAMMADEX SODIUM 500 MG/5ML IV SOLN: INTRAVENOUS | Status: DC | PRN

## 2017-11-06 MED ORDER — DOCUSATE SODIUM 100 MG PO CAPS: 100.0000 mg | ORAL_CAPSULE | Freq: Two times a day (BID) | ORAL | 0 refills | Status: AC

## 2017-11-06 MED ORDER — MIDAZOLAM HCL 5 MG/5ML IJ SOLN
INTRAMUSCULAR | Status: DC | PRN
  Administered 2017-11-06: 2 mg via INTRAVENOUS

## 2017-11-06 MED ORDER — FENTANYL CITRATE (PF) 100 MCG/2ML IJ SOLN
INTRAMUSCULAR | Status: DC | PRN
  Administered 2017-11-06 (×3): 50 ug via INTRAVENOUS

## 2017-11-06 MED ORDER — PROPOFOL 10 MG/ML IV BOLUS
INTRAVENOUS | Status: AC
Start: 2017-11-06 — End: 2017-11-06
  Filled 2017-11-06: qty 20

## 2017-11-06 MED ORDER — FENTANYL CITRATE (PF) 250 MCG/5ML IJ SOLN
INTRAMUSCULAR | Status: AC
  Filled 2017-11-06: qty 5

## 2017-11-06 MED ORDER — SUGAMMADEX SODIUM 500 MG/5ML IV SOLN
INTRAVENOUS | Status: DC | PRN
  Administered 2017-11-06: 300 mg via INTRAVENOUS

## 2017-11-06 MED ORDER — MIDAZOLAM HCL 2 MG/2ML IJ SOLN
INTRAMUSCULAR | Status: AC
  Filled 2017-11-06: qty 2

## 2017-11-06 MED ORDER — DEXAMETHASONE SODIUM PHOSPHATE 10 MG/ML IJ SOLN
INTRAMUSCULAR | Status: AC
  Filled 2017-11-06: qty 1

## 2017-11-06 MED ORDER — ONDANSETRON HCL 4 MG/2ML IJ SOLN
INTRAMUSCULAR | Status: AC
  Filled 2017-11-06: qty 2

## 2017-11-06 MED ORDER — LIDOCAINE 2% (20 MG/ML) 5 ML SYRINGE
INTRAMUSCULAR | Status: DC | PRN
  Administered 2017-11-06: 100 mg via INTRAVENOUS

## 2017-11-06 MED ORDER — ACETAMINOPHEN 325 MG PO TABS
ORAL_TABLET | ORAL | Status: AC
  Administered 2017-11-06: 650 mg via ORAL
  Filled 2017-11-06: qty 2

## 2017-11-06 MED ORDER — BUPIVACAINE-EPINEPHRINE 0.25% -1:200000 IJ SOLN
INTRAMUSCULAR | Status: DC | PRN
  Administered 2017-11-06: 30 mL

## 2017-11-06 MED ORDER — ONDANSETRON HCL 4 MG/2ML IJ SOLN
INTRAMUSCULAR | Status: DC | PRN
  Administered 2017-11-06: 4 mg via INTRAVENOUS

## 2017-11-06 MED ORDER — OXYCODONE-ACETAMINOPHEN 5-325 MG PO TABS: 1.0000 | ORAL_TABLET | Freq: Four times a day (QID) | ORAL | 0 refills | Status: DC | PRN

## 2017-11-06 MED ORDER — LIDOCAINE 2% (20 MG/ML) 5 ML SYRINGE
INTRAMUSCULAR | Status: AC
  Filled 2017-11-06: qty 5

## 2017-11-06 MED ORDER — OXYCODONE HCL 5 MG PO TABS
5.0000 mg | ORAL_TABLET | ORAL | Status: DC | PRN
  Administered 2017-11-06: 10 mg via ORAL

## 2017-11-06 MED ORDER — FENTANYL CITRATE (PF) 100 MCG/2ML IJ SOLN
25.0000 ug | INTRAMUSCULAR | Status: DC | PRN
  Administered 2017-11-06 (×2): 50 ug via INTRAVENOUS

## 2017-11-06 MED ORDER — SODIUM CHLORIDE 0.9% FLUSH: 3.0000 mL | Freq: Two times a day (BID) | INTRAVENOUS | Status: DC

## 2017-11-06 MED ORDER — ACETAMINOPHEN 500 MG PO TABS
1000.0000 mg | ORAL_TABLET | ORAL | Status: AC
  Administered 2017-11-06: 1000 mg via ORAL

## 2017-11-06 MED ORDER — 0.9 % SODIUM CHLORIDE (POUR BTL) OPTIME
TOPICAL | Status: DC | PRN
Start: 2017-11-06 — End: 2017-11-06
  Administered 2017-11-06: 1000 mL

## 2017-11-06 MED ORDER — ALBUTEROL SULFATE HFA 108 (90 BASE) MCG/ACT IN AERS
INHALATION_SPRAY | RESPIRATORY_TRACT | Status: DC | PRN
  Administered 2017-11-06: 2 via RESPIRATORY_TRACT

## 2017-11-06 MED ORDER — ACETAMINOPHEN 650 MG RE SUPP: 650.0000 mg | RECTAL | Status: DC | PRN

## 2017-11-06 MED ORDER — FENTANYL CITRATE (PF) 100 MCG/2ML IJ SOLN
INTRAMUSCULAR | Status: AC
  Administered 2017-11-06: 50 ug via INTRAVENOUS
  Filled 2017-11-06: qty 2

## 2017-11-06 MED ORDER — ALBUTEROL SULFATE HFA 108 (90 BASE) MCG/ACT IN AERS
INHALATION_SPRAY | RESPIRATORY_TRACT | Status: AC
  Filled 2017-11-06: qty 6.7

## 2017-11-06 MED ORDER — CELECOXIB 200 MG PO CAPS
200.0000 mg | ORAL_CAPSULE | ORAL | Status: AC
  Administered 2017-11-06: 200 mg via ORAL

## 2017-11-06 SURGICAL SUPPLY — 37 items
ADH SKN CLS APL DERMABOND .7 (GAUZE/BANDAGES/DRESSINGS) ×2
APPLIER CLIP LOGIC TI 5 (MISCELLANEOUS) IMPLANT
APR CLP MED LRG 33X5 (MISCELLANEOUS)
BLADE CLIPPER SURG (BLADE) ×1 IMPLANT
CHLORAPREP W/TINT 26ML (MISCELLANEOUS) ×3 IMPLANT
COVER SURGICAL LIGHT HANDLE (MISCELLANEOUS) ×4 IMPLANT
DERMABOND ADVANCED (GAUZE/BANDAGES/DRESSINGS) ×1
DERMABOND ADVANCED .7 DNX12 (GAUZE/BANDAGES/DRESSINGS) IMPLANT
DEVICE SECURE STRAP 25 ABSORB (INSTRUMENTS) ×3 IMPLANT
ELECT REM PT RETURN 9FT ADLT (ELECTROSURGICAL) ×3
ELECTRODE REM PT RTRN 9FT ADLT (ELECTROSURGICAL) ×2 IMPLANT
GLOVE BIO SURGEON STRL SZ 6 (GLOVE) ×4 IMPLANT
GLOVE BIOGEL PI IND STRL 6.5 (GLOVE) ×2 IMPLANT
GLOVE BIOGEL PI INDICATOR 6.5 (GLOVE) ×1
GOWN STRL REUS W/ TWL LRG LVL3 (GOWN DISPOSABLE) ×6 IMPLANT
GOWN STRL REUS W/TWL LRG LVL3 (GOWN DISPOSABLE) ×9
KIT BASIN OR (CUSTOM PROCEDURE TRAY) ×3 IMPLANT
KIT ROOM TURNOVER OR (KITS) ×3 IMPLANT
MESH ULTRAPRO 3X6 7.6X15CM (Mesh General) ×1 IMPLANT
NDL INSUFFLATION 14GA 120MM (NEEDLE) ×2 IMPLANT
NEEDLE INSUFFLATION 14GA 120MM (NEEDLE) ×3 IMPLANT
NS IRRIG 1000ML POUR BTL (IV SOLUTION) ×3 IMPLANT
PAD ARMBOARD 7.5X6 YLW CONV (MISCELLANEOUS) ×3 IMPLANT
PENCIL BUTTON HOLSTER BLD 10FT (ELECTRODE) ×1 IMPLANT
SCISSORS LAP 5X35 DISP (ENDOMECHANICALS) ×3 IMPLANT
SLEEVE ENDOPATH XCEL 5M (ENDOMECHANICALS) ×3 IMPLANT
SUT ETHIBOND 0 MO6 C/R (SUTURE) ×1 IMPLANT
SUT MNCRL AB 4-0 PS2 18 (SUTURE) ×3 IMPLANT
SUT VIC AB 3-0 SH 27 (SUTURE) ×3
SUT VIC AB 3-0 SH 27X BRD (SUTURE) IMPLANT
TOWEL OR 17X24 6PK STRL BLUE (TOWEL DISPOSABLE) ×3 IMPLANT
TRAY FOLEY CATH SILVER 16FR (SET/KITS/TRAYS/PACK) ×3 IMPLANT
TRAY LAPAROSCOPIC MC (CUSTOM PROCEDURE TRAY) ×3 IMPLANT
TROCAR XCEL 12X100 BLDLESS (ENDOMECHANICALS) ×3 IMPLANT
TROCAR XCEL NON-BLD 5MMX100MML (ENDOMECHANICALS) ×3 IMPLANT
TUBING INSUFFLATION (TUBING) ×3 IMPLANT
WATER STERILE IRR 1000ML POUR (IV SOLUTION) ×3 IMPLANT

## 2017-11-06 NOTE — H&P (Signed)
Charles [name] DOB: January 26, 1951 Married / Language: English / Race: White Male  History of Present Illness  Patient words: This is a very pleasant gentleman presents with umbilical hernia which has been present for many years as well as new right groin pain and concern for hernia. He does not think the umbilical hernia has changed in size since he first noticed it but it does cause him some embarrassment from its appearance. No complaints of nausea or vomiting abdominal bloating or pain. He complains of right groin pain and the sensation that his testicle is riding up into his groin and he has to push down. He has not noticed any mass in the region. The pain is related to movement. No prior abdominal surgeries. 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  Diagnostic Studies History  Colonoscopy 1-5 years ago  Allergies  No Known Drug Allergies 08/19/2017  Medication History  AndroGel Pump (20.25 MG/ACT(1.62%) Gel, Transdermal) Active. Levothyroxine Sodium (175MCG Tablet, Oral) Active. Metoprolol Tartrate (50MG  Tablet, Oral) Active. Omeprazole (20MG  Capsule DR, Oral) Active. Cialis (20MG  Tablet, Oral) Active. Aspirin (81MG  Tablet, Oral) Active. Medications Reconciled  Social History  Alcohol use Remotely quit alcohol use. Caffeine use Coffee, Tea. Illicit drug use Uses socially only. Tobacco use Former smoker.  Family History Breast Cancer Mother, Sister. Cerebrovascular Accident Sister. Diabetes Mellitus Sister. Hypertension Sister. Prostate Cancer Father. Thyroid problems Sister.  Other Problems Arthritis Gastroesophageal Reflux Disease High blood pressure Thyroid Disease Umbilical Hernia Repair     Review of Systems  General Not Present- Appetite Loss, Chills, Fatigue, Fever, Night Sweats, Weight Gain and Weight Loss. Skin Not Present- Change in Wart/Mole, Dryness, Hives, Jaundice, New Lesions, Non-Healing Wounds, Rash  and Ulcer. HEENT Not Present- Earache, Hearing Loss, Hoarseness, Nose Bleed, Oral Ulcers, Ringing in the Ears, Seasonal Allergies, Sinus Pain, Sore Throat, Visual Disturbances, Wears glasses/contact lenses and Yellow Eyes. Respiratory Present- Snoring. Not Present- Bloody sputum, Chronic Cough, Difficulty Breathing and Wheezing. Breast Not Present- Breast Mass, Breast Pain, Nipple Discharge and Skin Changes. Gastrointestinal Not Present- Abdominal Pain, Bloating, Bloody Stool, Change in Bowel Habits, Chronic diarrhea, Constipation, Difficulty Swallowing, Excessive gas, Gets full quickly at meals, Hemorrhoids, Indigestion, Nausea, Rectal Pain and Vomiting. Male Genitourinary Present- Frequency. Not Present- Blood in Urine, Change in Urinary Stream, Impotence, Nocturia, Painful Urination, Urgency and Urine Leakage. All other systems negative  There were no vitals filed for this visit.    Physical Exam  General Note: alert and well appearing  Integumentary Note: Warm and dry  Head and Neck Note: No mass or thyromegaly  Eye Note: No scleral icterus, extraocular motions intact  Chest and Lung Exam Note: Unlabored respirations, clear bilaterally  Cardiovascular Note: Regular rate and rhythm, palpable distal pulses  Abdomen Note: Obese, nontender nondistended. There is a partially reducible umbilical hernia with incarcerated fat. The fascial defect is not palpable. 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 Given his groin pain and concern for inguinal hernia, I  recommend laparoscopic approach so that we can inspect the bilateral groins and repair these as well if indicated. We discussed risks of bleeding, infection, pain, scarring, intra-abdominal  injury, hernia recurrence, chronic groin pain/failure to resolve symptoms. We also discussed risks of general anesthesia including heart attack, blood clots, stroke, pneumonia and death. He expressed understanding. He would like to proceed with surgery in January.

## 2017-11-06 NOTE — Op Note (Signed)
Operative Note  Charles [name]  [id number]  [id number]  11/06/2017   Surgeon: Vikki Ports A ConnorMD  Assistant: OR staff  Procedure performed: Laparoscopic transabdominal preperitoneal repair of right indirect inguinal hernia (ultra Pro mesh, secure strap vicryl tacker); primary umbilical hernia repair  Preop diagnosis: Umbilical hernia, bilateral inguinodynia Post-op diagnosis/intraop findings: Umbilical hernia with incarcerated omentum, very very small right indirect inguinal hernia. There was no hernia on the left side in the director of the indirect location but clearly some laxity of the tissues.  Specimens: No Retained items: no EBL: [id number] Complications: none  Description of procedure: After obtaining informed consent the patient was taken to the operating room and placed supine on operating room table wheregeneral endotracheal anesthesia was initiated, preoperative antibiotics were administered, SCDs applied, Foley catheter inserted and a formal timeout was performed. The abdomen was clipped, prepped and draped in usual sterile fashion. A curvilinear infraumbilical incision was made sharply and then cautery and blunt dissection were used to isolate the umbilical stalk and hernia sac. The umbilical skin was divided from the hernia sac using cautery. The hernia defect was approximately 1 cm in diameter. This defect was used to insert a 12 mm trocar and the abdomen was insufflated to 15 mmHg without consequence. Gross inspection revealed no evidence of injury from entry. The patient was placed in steep Trendelenburg and the pelvis was inspected. On the left side there was some laxity of the pelvic wall but there was no hernia present. On the right side there was a very small indirect hernia defect without any incarcerated contents. 5 mm trochars were placed on either side of the abdomen under direct visualization and after infiltration with local. We noted a strip of omentum entrapped  within the umbilical defect and this was easily reduced. No other structures were noted entrapped within the umbilical defect. Cautery and sharp dissection were used to develop a flap of peritoneum staining from the anterior superior iliac spine to the medial umbilical ligament. Gentle blunt dissection was used to develop this flap inferiorly and in doing so the hernia sac was completely reduced intact. Dissection continued until the Cooper's ligament was exposed and we had prepared adequate room for the mesh. A piece of ultra Pro mesh was then brought to the field and trimmed slightly to accommodate the defect. This is introduced through the 12 motor trocar and then placed over the indirect hernia defect. It was secured to the Cooper's ligament with the secure strap tacker and then superiorly on either side of the inferior epigastric vessels using the same. No tacks were placed laterally or inferiorly. The mesh was noted to sit nicely and flushed within the field with excellent overlap of the hernia defect. The peritoneal flap was then brought back up to completely cover the mesh and re-opposed to the anterior abdominal wall using the secure strap tacker. We then removed the 43mm trocar and desufflated the abdomen. The umbilical hernia defect was closed with several interrupted 0 Ethibonds with good effect. Once this was complete, the abdomen was reinsufflated. The umbilical hernia defect was inspected from within and noted to be airtight and hemostatic. The right inguinal hernia repair was reinspected and also noted to be hemostatic with complete coverage of the mesh. The patient was taken out of Trendelenburg and the omentum was brought down over the bowel. The remainder of the local was infiltrated in the preperitoneal and subcutaneous tissue surrounding the umbilicus as well as a right-sided laparoscopic assisted taps block. Once again  the abdomen was desufflated and the remaining trochars removed. The  umbilical skin was tacked back down to the fascia using a 3-0 Vicryl and all the skin incisions were closed with subcuticular Monocryl and Dermabond. The patient was then awakened, extubated and taken to PACU in stable condition.   All counts were correct at the completion of the case.

## 2017-11-06 NOTE — Anesthesia Procedure Notes (Signed)
Procedure Name: Intubation Date/Time: 11/06/2017 2:50 PM Performed by: Lowella Dell, CRNA Pre-anesthesia Checklist: Patient identified, Emergency Drugs available, Suction available and Patient being monitored Patient Re-evaluated:Patient Re-evaluated prior to induction Oxygen Delivery Method: Circle System Utilized Preoxygenation: Pre-oxygenation with 100% oxygen Induction Type: IV induction Ventilation: Two handed mask ventilation required and Oral airway inserted - appropriate to patient size Laryngoscope Size: Mac and 4 Grade View: Grade II Tube type: Oral Number of attempts: 1 Airway Equipment and Method: Stylet Placement Confirmation: ETT inserted through vocal cords under direct vision,  positive ETCO2 and breath sounds checked- equal and bilateral Secured at: 23 cm Tube secured with: Tape Dental Injury: Teeth and Oropharynx as per pre-operative assessment

## 2017-11-06 NOTE — Anesthesia Preprocedure Evaluation (Addendum)
Anesthesia Evaluation  Patient identified by MRN, date of birth, ID band Patient awake    Reviewed: Allergy & Precautions, NPO status , Patient's Chart, lab work & pertinent test results  Airway Mallampati: III  TM Distance: >3 FB Neck ROM: Full    Dental  (+) Dental Advisory Given   Pulmonary former smoker,    Pulmonary exam normal breath sounds clear to auscultation       Cardiovascular hypertension, negative cardio ROS Normal cardiovascular exam Rhythm:Regular Rate:Normal     Neuro/Psych negative neurological ROS  negative psych ROS   GI/Hepatic negative GI ROS, Neg liver ROS,   Endo/Other  negative endocrine ROSHypothyroidism   Renal/GU negative Renal ROS     Musculoskeletal  (+) Arthritis ,   Abdominal   Peds  Hematology negative hematology ROS (+)   Anesthesia Other Findings   Reproductive/Obstetrics negative OB ROS                            Anesthesia Physical Anesthesia Plan  ASA: III  Anesthesia Plan: General   Post-op Pain Management:    Induction: Intravenous  PONV Risk Score and Plan: 3 and Ondansetron, Dexamethasone and Treatment may vary due to age or medical condition  Airway Management Planned: Oral ETT  Additional Equipment:   Intra-op Plan:   Post-operative Plan: Extubation in OR  Informed Consent: I have reviewed the patients History and Physical, chart, labs and discussed the procedure including the risks, benefits and alternatives for the proposed anesthesia with the patient or authorized representative who has indicated his/her understanding and acceptance.   Dental advisory given  Plan Discussed with: CRNA  Anesthesia Plan Comments:         Anesthesia Quick Evaluation

## 2017-11-06 NOTE — Transfer of Care (Signed)
Immediate Anesthesia Transfer of Care Note  Patient: Newt Minion Porr  Procedure(s) Performed: LAPAROSCOPIC UMBILICAL HERNIA (N/A Abdomen) LAPAROSCOPIC RIGHT INGUINAL HERNIA REPAIR (Right Inguinal) INSERTION OF MESH (Right Inguinal)  Patient Location: PACU  Anesthesia Type:General  Level of Consciousness: awake, alert  and oriented  Airway & Oxygen Therapy: Patient Spontanous Breathing and Patient connected to face mask oxygen  Post-op Assessment: Report given to RN and Post -op Vital signs reviewed and stable  Post vital signs: Reviewed and stable  Last Vitals:  Vitals:   11/06/17 1016 11/06/17 1619  BP: (!) 176/89   Pulse: (!) 56   Resp: 20   Temp: 36.8 C   SpO2: 95% 100%    Last Pain:  Vitals:   11/06/17 1037  TempSrc:   PainSc: 2       Patients Stated Pain Goal: 3 (53/29/92 4268)  Complications: No apparent anesthesia complications

## 2017-11-06 NOTE — Discharge Instructions (Signed)
HERNIA REPAIR: POST OP INSTRUCTIONS  ######################################################################  EAT Gradually transition to a high fiber diet with a fiber supplement over the next few weeks after discharge.  Start with a pureed / full liquid diet (see below)  WALK Walk an hour a day.  Control your pain to do that.    CONTROL PAIN Control pain so that you can walk, sleep, tolerate sneezing/coughing, go up/down stairs.  HAVE A BOWEL MOVEMENT DAILY Keep your bowels regular to avoid problems.  OK to try a laxative to override constipation.  OK to use an antidairrheal to slow down diarrhea.  Call if not better after 2 tries  CALL IF YOU HAVE PROBLEMS/CONCERNS Call if you are still struggling despite following these instructions. Call if you have concerns not answered by these instructions  ######################################################################    1. DIET: Follow a light bland diet the first 24 hours after arrival home, such as soup, liquids, crackers, etc.  Be sure to include lots of fluids daily.  Avoid fast food or heavy meals as your are more likely to get nauseated.  Eat a low fat the next few days after surgery. 2. Take your usually prescribed home medications unless otherwise directed. 3. PAIN CONTROL: a. Pain is best controlled by a usual combination of three different methods TOGETHER: i. Ice/Heat ii. Over the counter pain medication iii. Prescription pain medication b. Most patients will experience some swelling and bruising around the hernia(s) such as the bellybutton, groins, or old incisions.  Ice packs or heating pads (30-60 minutes up to 6 times a day) will help. Use ice for the first few days to help decrease swelling and bruising, then switch to heat to help relax tight/sore spots and speed recovery.  Some people prefer to use ice alone, heat alone, alternating between ice & heat.  Experiment to what works for you.  Swelling and bruising can take  several weeks to resolve.   c. It is helpful to take an over-the-counter pain medication regularly for the first few weeks.  Choose one of the following that works best for you: i. Naproxen (Aleve, etc)  Two 220mg  tabs twice a day ii. Ibuprofen (Advil, etc) Three 200mg  tabs four times a day (every meal & bedtime) iii. Acetaminophen (Tylenol, etc) 325-650mg  four times a day (every meal & bedtime) d. A  prescription for pain medication should be given to you upon discharge.  Take your pain medication as prescribed.  i. If you are having problems/concerns with the prescription medicine (does not control pain, nausea, vomiting, rash, itching, etc), please call us [phone] to see if we need to switch you to a different pain medicine that will work better for you and/or control your side effect better. ii. If you need a refill on your pain medication, please contact your pharmacy.  They will contact our office to request authorization. Prescriptions will not be filled after 5 pm or on week-ends. 4. Avoid getting constipated.  Between the surgery and the pain medications, it is common to experience some constipation.  Increasing fluid intake and taking a fiber supplement (such as Metamucil, Citrucel, FiberCon, MiraLax, etc) 1-2 times a day regularly will usually help prevent this problem from occurring.  A mild laxative (prune juice, Milk of Magnesia, MiraLax, etc) should be taken according to package directions if there are no bowel movements after 48 hours.   5. Wash / shower every day.  You may shower over the skin glue which is waterproof.  No rubbing, scrubbing,  lotions or ointments to incisions. 6. Skin glue will flake off after about 2 weeks.  You may leave the incision open to air.  You may replace a dressing/Band-Aid to cover the incision for comfort if you wish.  Continue to shower over incision(s) after the dressing is off.  7. ACTIVITIES as tolerated:   a. You may resume regular (light) daily  activities beginning the next day--such as daily self-care, walking, climbing stairs--gradually increasing activities as tolerated.  If you can walk 30 minutes without difficulty, it is safe to try more intense activity such as jogging, treadmill, bicycling, low-impact aerobics, swimming, etc. b. Refrain from the most intensive and strenuous activity for last such as sit-ups, heavy lifting, contact sports, etc  Refrain from any heavy lifting or straining until 6 weeks after surgery.   c. DO NOT PUSH THROUGH PAIN.  Let pain be your guide: If it hurts to do something, don't do it.  Pain is your body warning you to avoid that activity for another week until the pain goes down. d. You may drive when you are no longer taking prescription pain medication, you can comfortably wear a seatbelt, and you can safely maneuver your car and apply brakes. e. Dennis Bast may have sexual intercourse when it is comfortable.  8. FOLLOW UP in our office a. Please call [facility] at [phone] to set up an appointment to see your surgeon in the office for a follow-up appointment approximately 2-3 weeks after your surgery. b. Make sure that you call for this appointment the day you arrive home to insure a convenient appointment time. 9.  IF YOU HAVE DISABILITY OR FAMILY LEAVE FORMS, BRING THEM TO THE OFFICE FOR PROCESSING.  DO NOT GIVE THEM TO YOUR DOCTOR.  WHEN TO CALL us [phone]: 1. Poor pain control 2. Reactions / problems with new medications (rash/itching, nausea, etc)  3. Fever over 101.5 F (38.5 C) 4. Inability to urinate 5. Nausea and/or vomiting 6. Worsening swelling or bruising 7. Continued bleeding from incision. 8. Increased pain, redness, or drainage from the incision   The clinic staff is available to answer your questions during regular business hours (8:30am-5pm).  Please dont hesitate to call and ask to speak to one of our nurses for clinical concerns.   If you have a medical emergency, go to the nearest  emergency room or call 911.  A surgeon from [facility] is always on call at the hospitals in [facility], [place], [place], [place], [place]  [address] ?  [address] [address], [place], [place]   [address] MAIN: [phone] ? TOLL FREE: [phone] ? FAX: [phone] [email or web address]

## 2017-11-09 ENCOUNTER — Encounter (HOSPITAL_COMMUNITY): Payer: Self-pay | Admitting: Surgery

## 2017-11-09 NOTE — Anesthesia Postprocedure Evaluation (Signed)
Anesthesia Post Note  Patient: Charles [name]  Procedure(s) Performed: LAPAROSCOPIC UMBILICAL HERNIA (N/A Abdomen) LAPAROSCOPIC RIGHT INGUINAL HERNIA REPAIR (Right Inguinal) INSERTION OF MESH (Right Inguinal)     Patient location during evaluation: PACU Anesthesia Type: General Level of consciousness: sedated and patient cooperative Pain management: pain level controlled Vital Signs Assessment: post-procedure vital signs reviewed and stable Respiratory status: spontaneous breathing Cardiovascular status: stable Anesthetic complications: no    Last Vitals:  Vitals:   11/06/17 1745 11/06/17 1800  BP:  135/71  Pulse: (!) 57 (!) 56  Resp: 15 18  Temp:  36.7 C  SpO2: 95% 95%    Last Pain:  Vitals:   11/06/17 1715  TempSrc:   PainSc: 6    Pain Goal: Patients Stated Pain Goal: 3 (11/06/17 1037)               Nolon Nations

## 2017-12-09 ENCOUNTER — Telehealth: Payer: Self-pay | Admitting: Internal Medicine

## 2017-12-09 NOTE — Telephone Encounter (Signed)
[place] Controlled Substance Database checked. Last filled on 11/06/17

## 2017-12-25 ENCOUNTER — Other Ambulatory Visit: Payer: Self-pay | Admitting: Internal Medicine

## 2017-12-29 ENCOUNTER — Telehealth: Payer: Self-pay | Admitting: Emergency Medicine

## 2017-12-29 NOTE — Telephone Encounter (Signed)
See 12/29/17 phone note.

## 2017-12-29 NOTE — Telephone Encounter (Signed)
PA completed for Testosterone. Key [id number]. Awaiting response.    Contacted pts wife, she advised pt had new drug coverage. Contact [facility] to receive drug coverage information. Pt wife is okay with placing Urology referral if gel does not get approved.   BIN [id number] PCN [id number] [id number] ID [id number]

## 2017-12-29 NOTE — Telephone Encounter (Signed)
Charles [name], wife Call back [phone]  Pharmacy is holding RX for testosterone stating the ins is the hold up because they need info from the doctor. Has PA been submitted? Please notify pt or wife.

## 2017-12-30 NOTE — Telephone Encounter (Signed)
PA approved through 10/19/18. LVM with pts wife and spoke with [facility] to advised RX was approved.
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
## 2018-01-03 NOTE — Patient Instructions (Addendum)
Goal BP < 140/90  Test(s) ordered today. Your results will be released to [place] (or called to you) after review, usually within 72hours after test completion. If any changes need to be made, you will be notified at that same time.   Medications reviewed and updated.  Changes include starting lisinopril 10 mg daily.    Your prescription(s) have been submitted to your pharmacy. Please take as directed and contact our office if you believe you are having problem(s) with the medication(s).   Please followup in 6 months

## 2018-01-05 ENCOUNTER — Encounter: Payer: Self-pay | Admitting: Internal Medicine

## 2018-01-05 ENCOUNTER — Ambulatory Visit (INDEPENDENT_AMBULATORY_CARE_PROVIDER_SITE_OTHER): Payer: Medicare HMO | Admitting: Internal Medicine

## 2018-01-05 ENCOUNTER — Other Ambulatory Visit (INDEPENDENT_AMBULATORY_CARE_PROVIDER_SITE_OTHER): Payer: Medicare HMO

## 2018-01-05 VITALS — BP 162/88 | HR 57 | Temp 98.1°F | Resp 18 | Ht 71.0 in | Wt 269.0 lb

## 2018-01-05 DIAGNOSIS — E349 Endocrine disorder, unspecified: Secondary | ICD-10-CM | POA: Diagnosis not present

## 2018-01-05 DIAGNOSIS — E038 Other specified hypothyroidism: Secondary | ICD-10-CM

## 2018-01-05 DIAGNOSIS — M19042 Primary osteoarthritis, left hand: Secondary | ICD-10-CM

## 2018-01-05 DIAGNOSIS — M19041 Primary osteoarthritis, right hand: Secondary | ICD-10-CM

## 2018-01-05 DIAGNOSIS — R739 Hyperglycemia, unspecified: Secondary | ICD-10-CM

## 2018-01-05 DIAGNOSIS — I10 Essential (primary) hypertension: Secondary | ICD-10-CM

## 2018-01-05 DIAGNOSIS — E785 Hyperlipidemia, unspecified: Secondary | ICD-10-CM

## 2018-01-05 DIAGNOSIS — M19049 Primary osteoarthritis, unspecified hand: Secondary | ICD-10-CM | POA: Insufficient documentation

## 2018-01-05 LAB — CBC WITH DIFFERENTIAL/PLATELET
BASOS ABS: 0.1 10*3/uL (ref 0.0–0.1)
Basophils Relative: 1 % (ref 0.0–3.0)
Eosinophils Absolute: 0.4 10*3/uL (ref 0.0–0.7)
Eosinophils Relative: 4.6 % (ref 0.0–5.0)
HEMATOCRIT: 48.4 % (ref 39.0–52.0)
Hemoglobin: 16.6 g/dL (ref 13.0–17.0)
LYMPHS PCT: 33.8 % (ref 12.0–46.0)
Lymphs Abs: 2.6 10*3/uL (ref 0.7–4.0)
MCHC: 34.3 g/dL (ref 30.0–36.0)
MCV: 95.5 fl (ref 78.0–100.0)
MONOS PCT: 6.9 % (ref 3.0–12.0)
Monocytes Absolute: 0.5 10*3/uL (ref 0.1–1.0)
NEUTROS PCT: 53.7 % (ref 43.0–77.0)
Neutro Abs: 4.2 10*3/uL (ref 1.4–7.7)
Platelets: 277 10*3/uL (ref 150.0–400.0)
RBC: 5.06 Mil/uL (ref 4.22–5.81)
RDW: 13 % (ref 11.5–15.5)
WBC: 7.8 10*3/uL (ref 4.0–10.5)

## 2018-01-05 LAB — COMPREHENSIVE METABOLIC PANEL
ALT: 29 U/L (ref 0–53)
AST: 21 U/L (ref 0–37)
Albumin: 4.7 g/dL (ref 3.5–5.2)
Alkaline Phosphatase: 58 U/L (ref 39–117)
BUN: 18 mg/dL (ref 6–23)
CALCIUM: 10.3 mg/dL (ref 8.4–10.5)
CHLORIDE: 101 meq/L (ref 96–112)
CO2: 30 meq/L (ref 19–32)
Creatinine, Ser: 1.25 mg/dL (ref 0.40–1.50)
GFR: 61.54 mL/min (ref >60.00)
Glucose, Bld: 98 mg/dL (ref 70–99)
Potassium: 4.5 mEq/L (ref 3.5–5.1)
Sodium: 139 mEq/L (ref 135–145)
Total Bilirubin: 0.6 mg/dL (ref 0.2–1.2)
Total Protein: 7.7 g/dL (ref 6.0–8.3)

## 2018-01-05 LAB — LIPID PANEL
CHOL/HDL RATIO: 5
Cholesterol: 194 mg/dL (ref 0–200)
HDL: 39.8 mg/dL (ref >39.00)
LDL Cholesterol: 117 mg/dL — ABNORMAL HIGH (ref 0–99)
NONHDL: 154.22
Triglycerides: 188 mg/dL — ABNORMAL HIGH (ref 0.0–149.0)
VLDL: 37.6 mg/dL (ref 0.0–40.0)

## 2018-01-05 LAB — HEMOGLOBIN A1C: Hgb A1c MFr Bld: 5.6 % (ref 4.6–6.5)

## 2018-01-05 MED ORDER — LISINOPRIL 10 MG PO TABS: 10.0000 mg | ORAL_TABLET | Freq: Every day | ORAL | 3 refills | Status: DC

## 2018-01-05 MED ORDER — METOPROLOL TARTRATE 25 MG PO TABS: 25.0000 mg | ORAL_TABLET | Freq: Two times a day (BID) | ORAL | 3 refills | Status: DC

## 2018-01-05 NOTE — Assessment & Plan Note (Signed)
Check a1c Low sugar / carb diet Stressed regular exercise   

## 2018-01-05 NOTE — Assessment & Plan Note (Signed)
Uncontrolled BP high at home and here Current regimen effective and well tolerated Continue current medications at current doses cmp

## 2018-01-05 NOTE — Assessment & Plan Note (Signed)
Take advil as needed Can try tylenol if needed, advil only prn

## 2018-01-05 NOTE — Assessment & Plan Note (Signed)
Check lipid panel  Continue daily statin Regular exercise and healthy diet encouraged  

## 2018-01-05 NOTE — Assessment & Plan Note (Signed)
Check testosterone, cbc Continue current dose

## 2018-01-05 NOTE — Assessment & Plan Note (Signed)
Check tsh  Titrate med dose if needed  

## 2018-01-11 LAB — TESTOSTERONE, FREE & TOTAL
Free Testosterone: 82.3 pg/mL (ref 35.0–155.0)
Testosterone, Total, LC-MS-MS: 481 ng/dL (ref 250–1100)

## 2018-01-25 ENCOUNTER — Encounter: Payer: Self-pay | Admitting: *Deleted

## 2018-01-25 ENCOUNTER — Ambulatory Visit (INDEPENDENT_AMBULATORY_CARE_PROVIDER_SITE_OTHER): Payer: Medicare HMO

## 2018-01-25 ENCOUNTER — Ambulatory Visit
Admission: EM | Admit: 2018-01-25 | Discharge: 2018-01-25 | Disposition: A | Discharge status: Home / Self Care | Payer: Medicare HMO | Attending: Family Medicine | Admitting: Family Medicine

## 2018-01-25 DIAGNOSIS — M25511 Pain in right shoulder: Secondary | ICD-10-CM

## 2018-01-25 DIAGNOSIS — M79601 Pain in right arm: Secondary | ICD-10-CM | POA: Diagnosis not present

## 2018-01-25 DIAGNOSIS — W19XXXA Unspecified fall, initial encounter: Secondary | ICD-10-CM

## 2018-01-25 DIAGNOSIS — S4991XA Unspecified injury of right shoulder and upper arm, initial encounter: Secondary | ICD-10-CM | POA: Diagnosis not present

## 2018-01-25 IMAGING — CR DG HUMERUS 2V *R*
4 series · 4 of 4 positions shown · non-contrast
Comparison: Right shoulder of today's date.

CLINICAL DATA: Patient fell today striking the right shoulder.
Limited range of motion. Pain over the proximal humerus as well as
the shoulder.

EXAM:
RIGHT HUMERUS - 2+ VIEW

[humerus ap (1 of 2)]
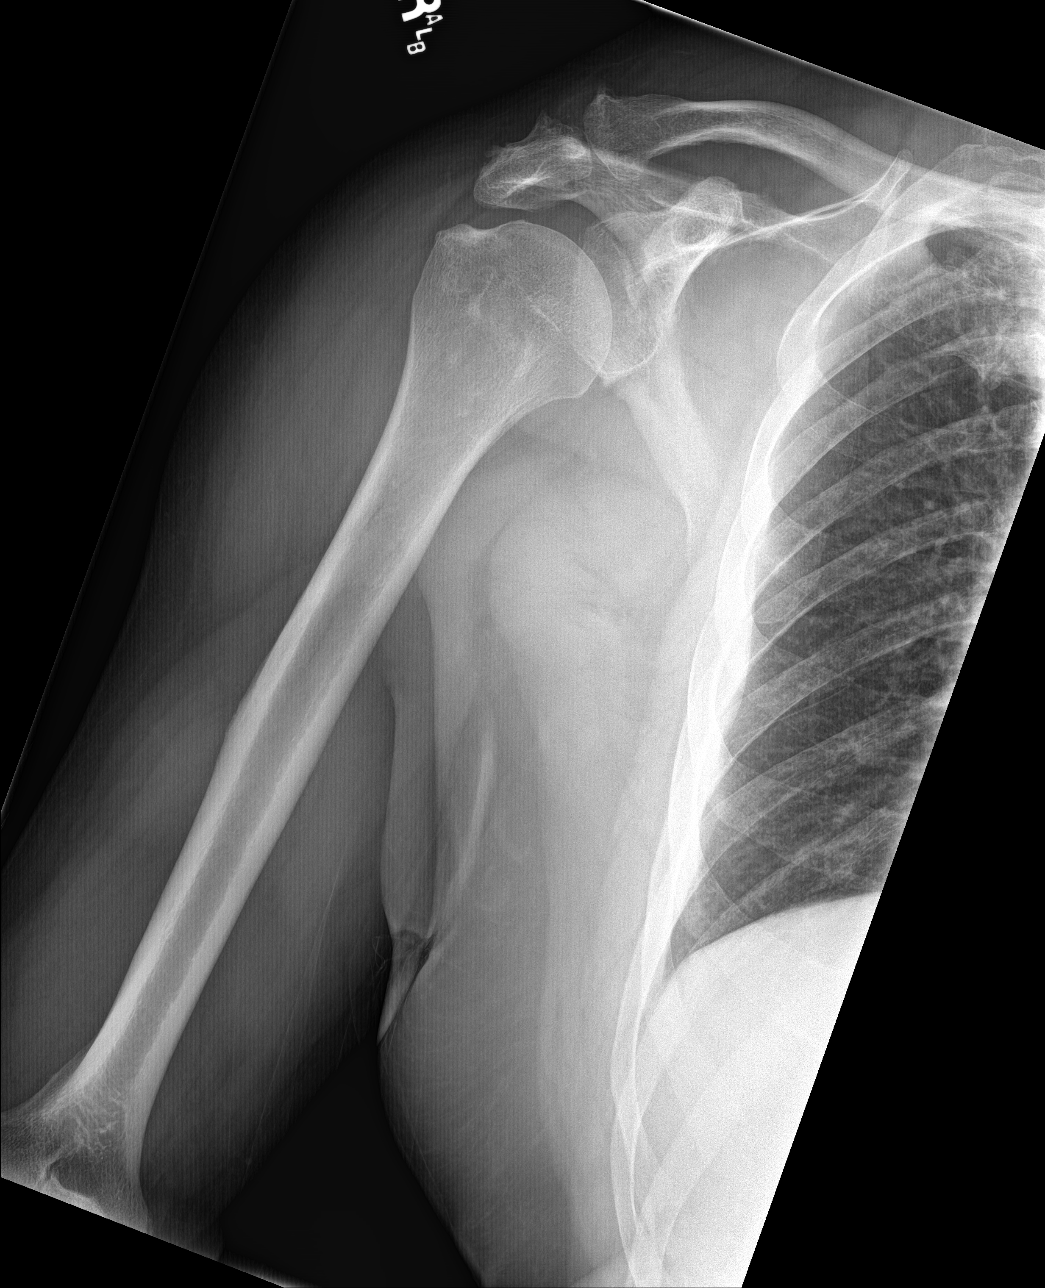

[humerus lat (1 of 2)]
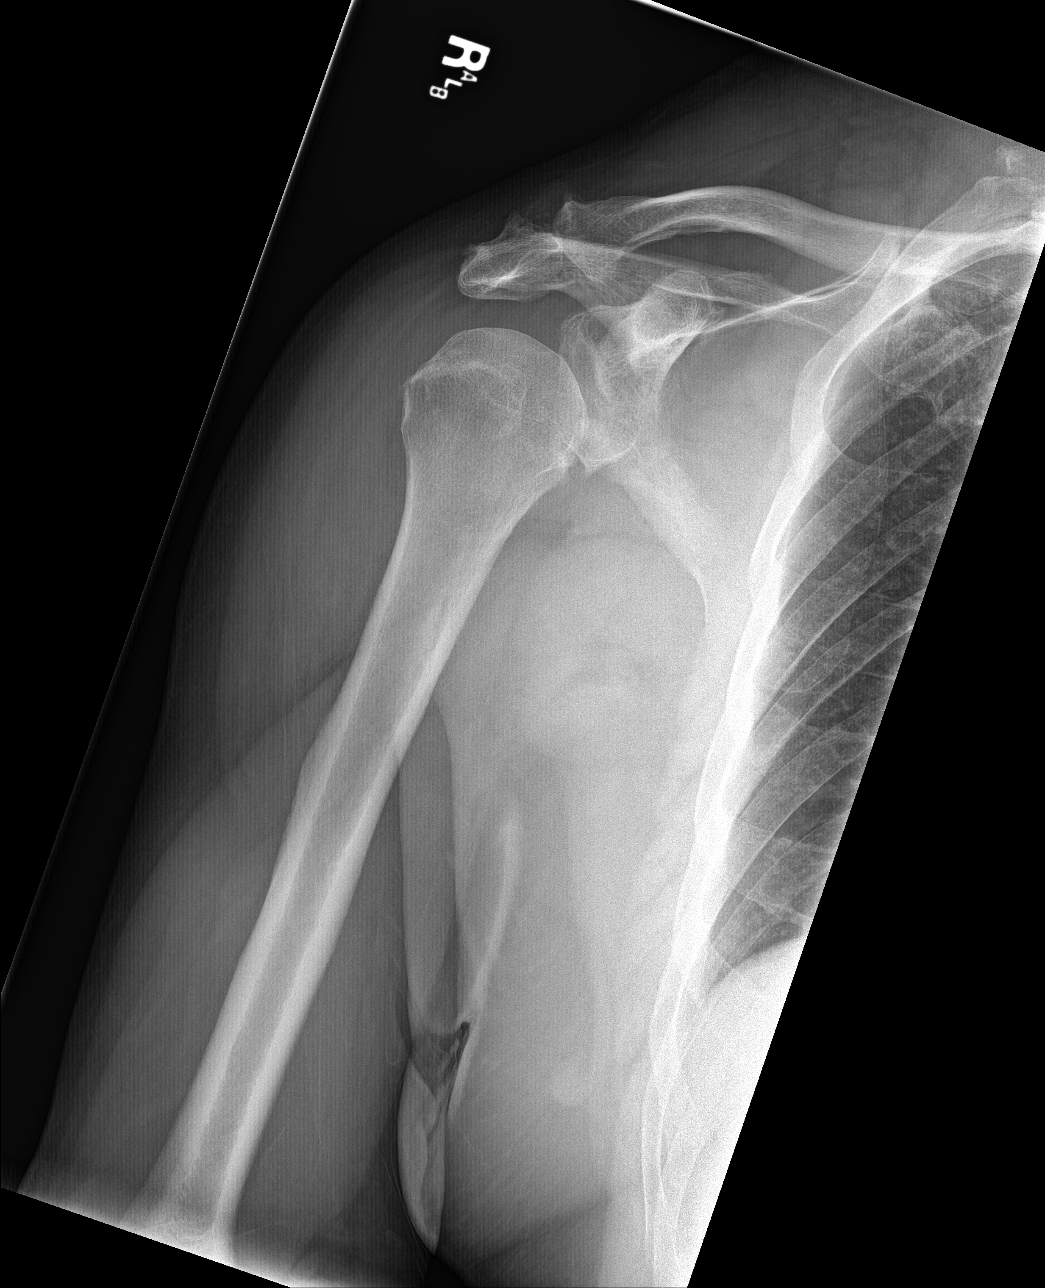

[humerus ap (2 of 2)]
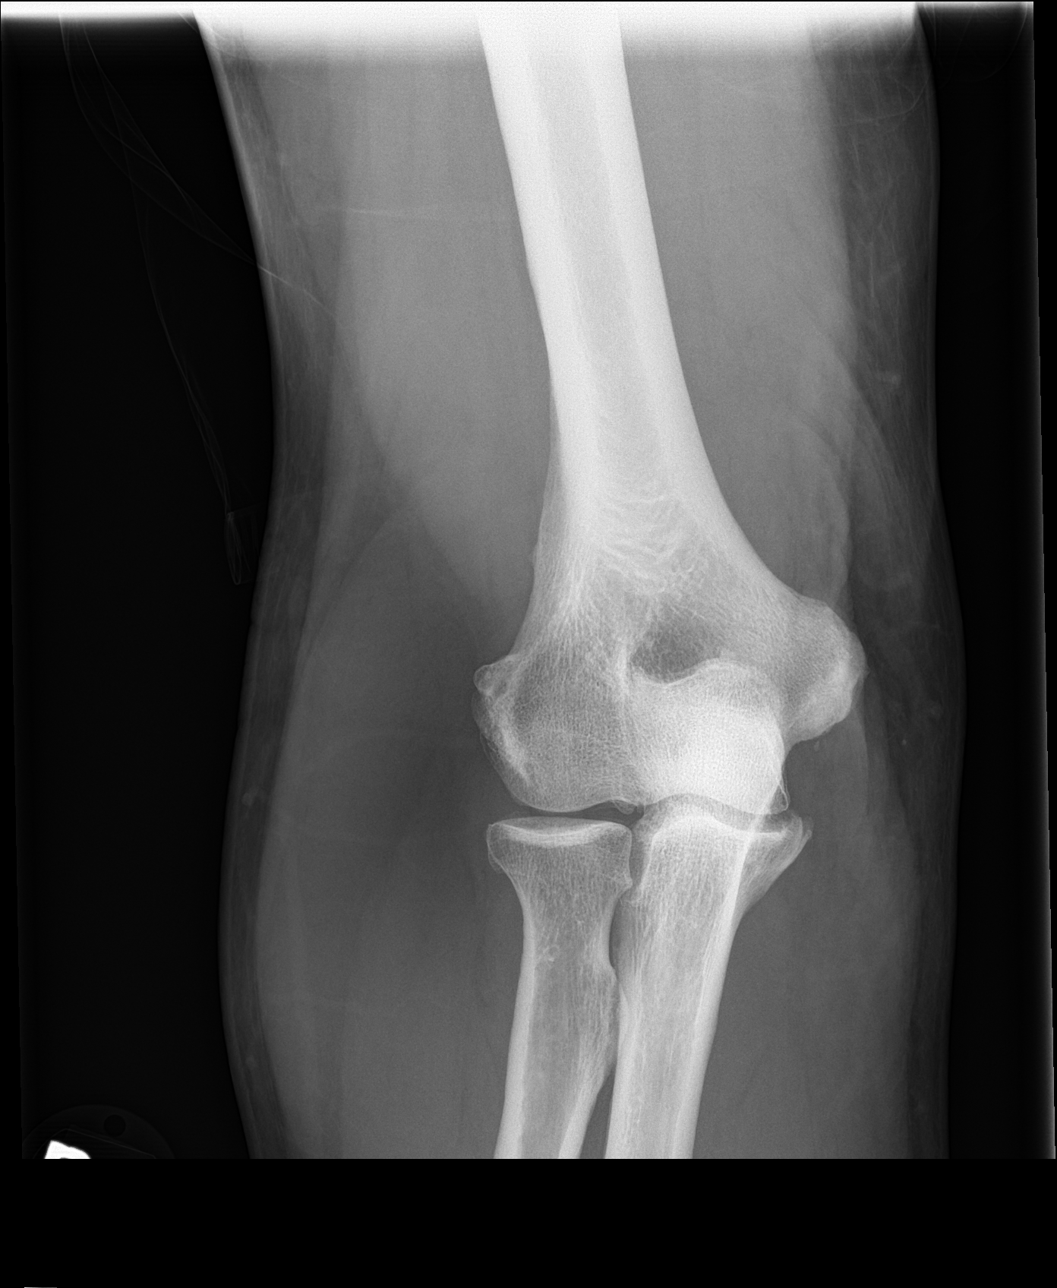

[humerus lat (2 of 2)]
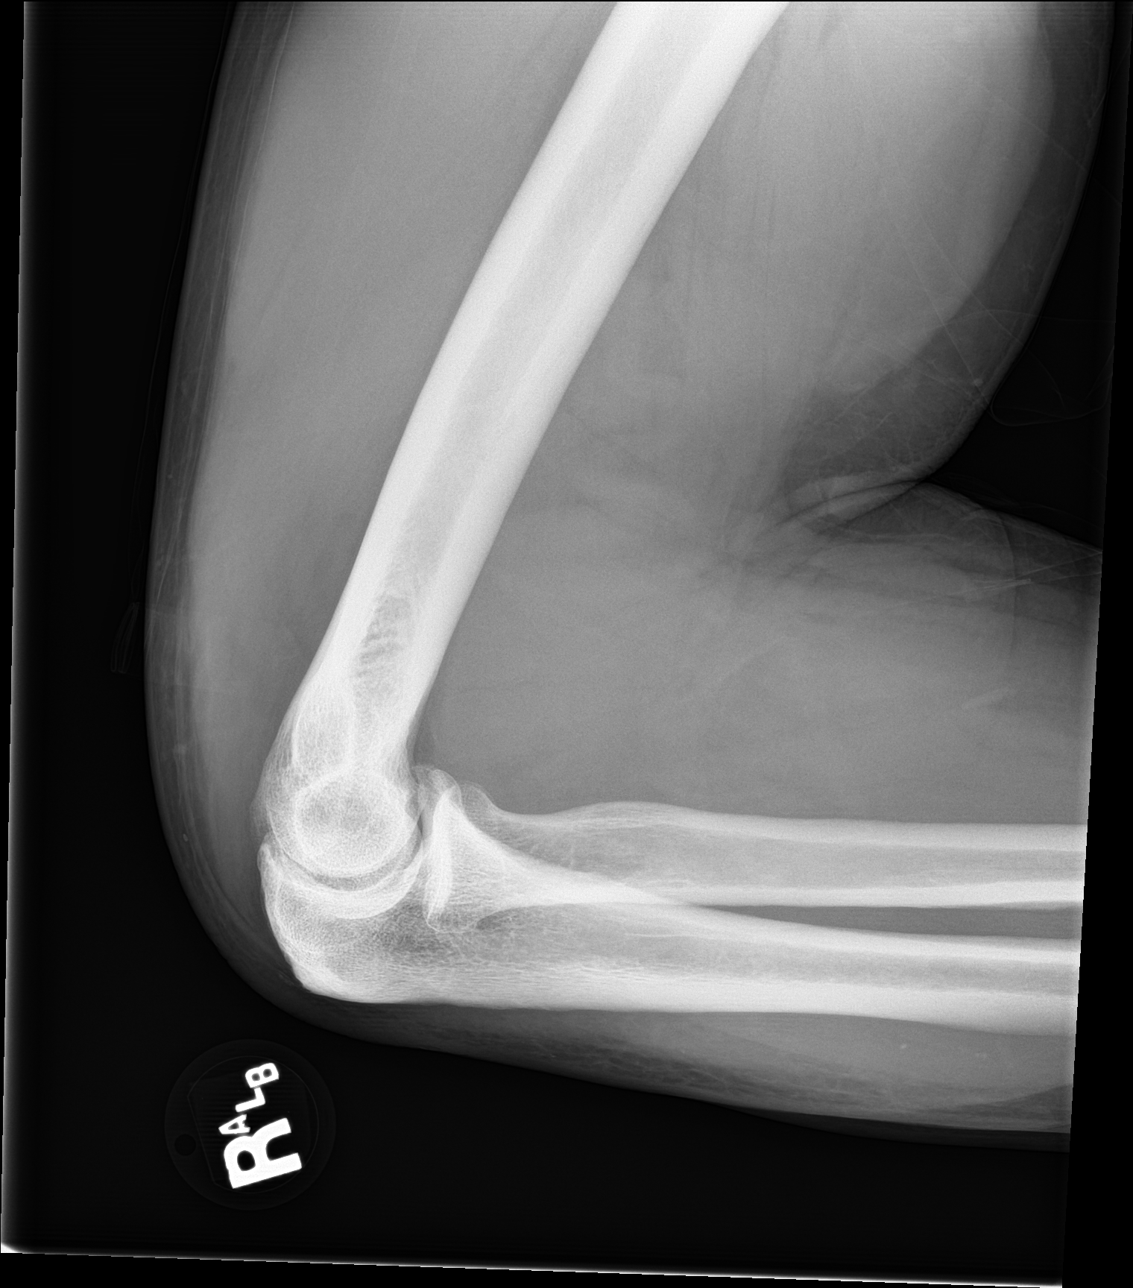

[4 of 4 positions shown; findings below may reference images not displayed]

FINDINGS: The humerus is subjectively adequately mineralized. There is no
acute humeral fracture. The glenohumeral joint space is well
maintained. The subacromial subdeltoid space is grossly normal. The
observed portions of the elbow exhibit no acute abnormalities. The
radial head is intact. The soft tissues are unremarkable.
IMPRESSION: There is no acute fracture of the right humerus.

## 2018-01-25 IMAGING — CR DG SHOULDER 2+V*R*
3 series · 3 of 3 positions shown · non-contrast
Comparison: None.

CLINICAL DATA: Recent fall with right shoulder pain, initial
encounter

EXAM:
RIGHT SHOULDER - 2+ VIEW

[shoulder grashey]
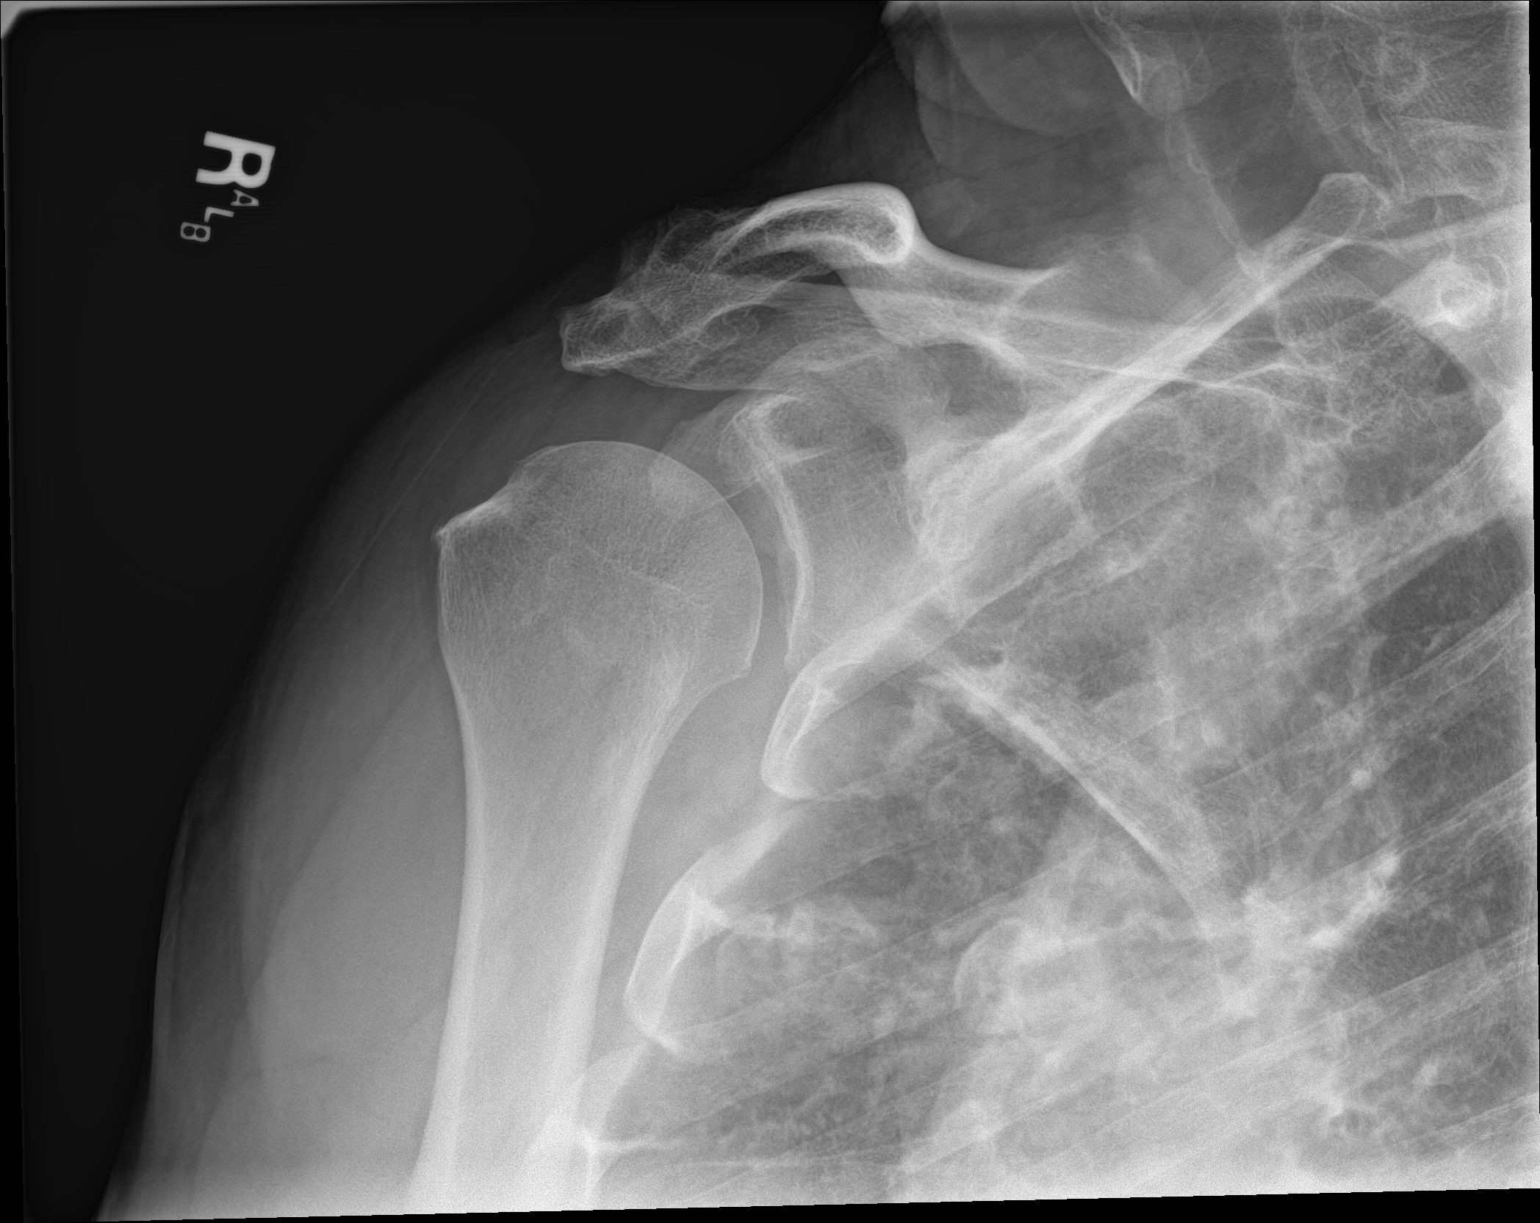

[shoulder y view]
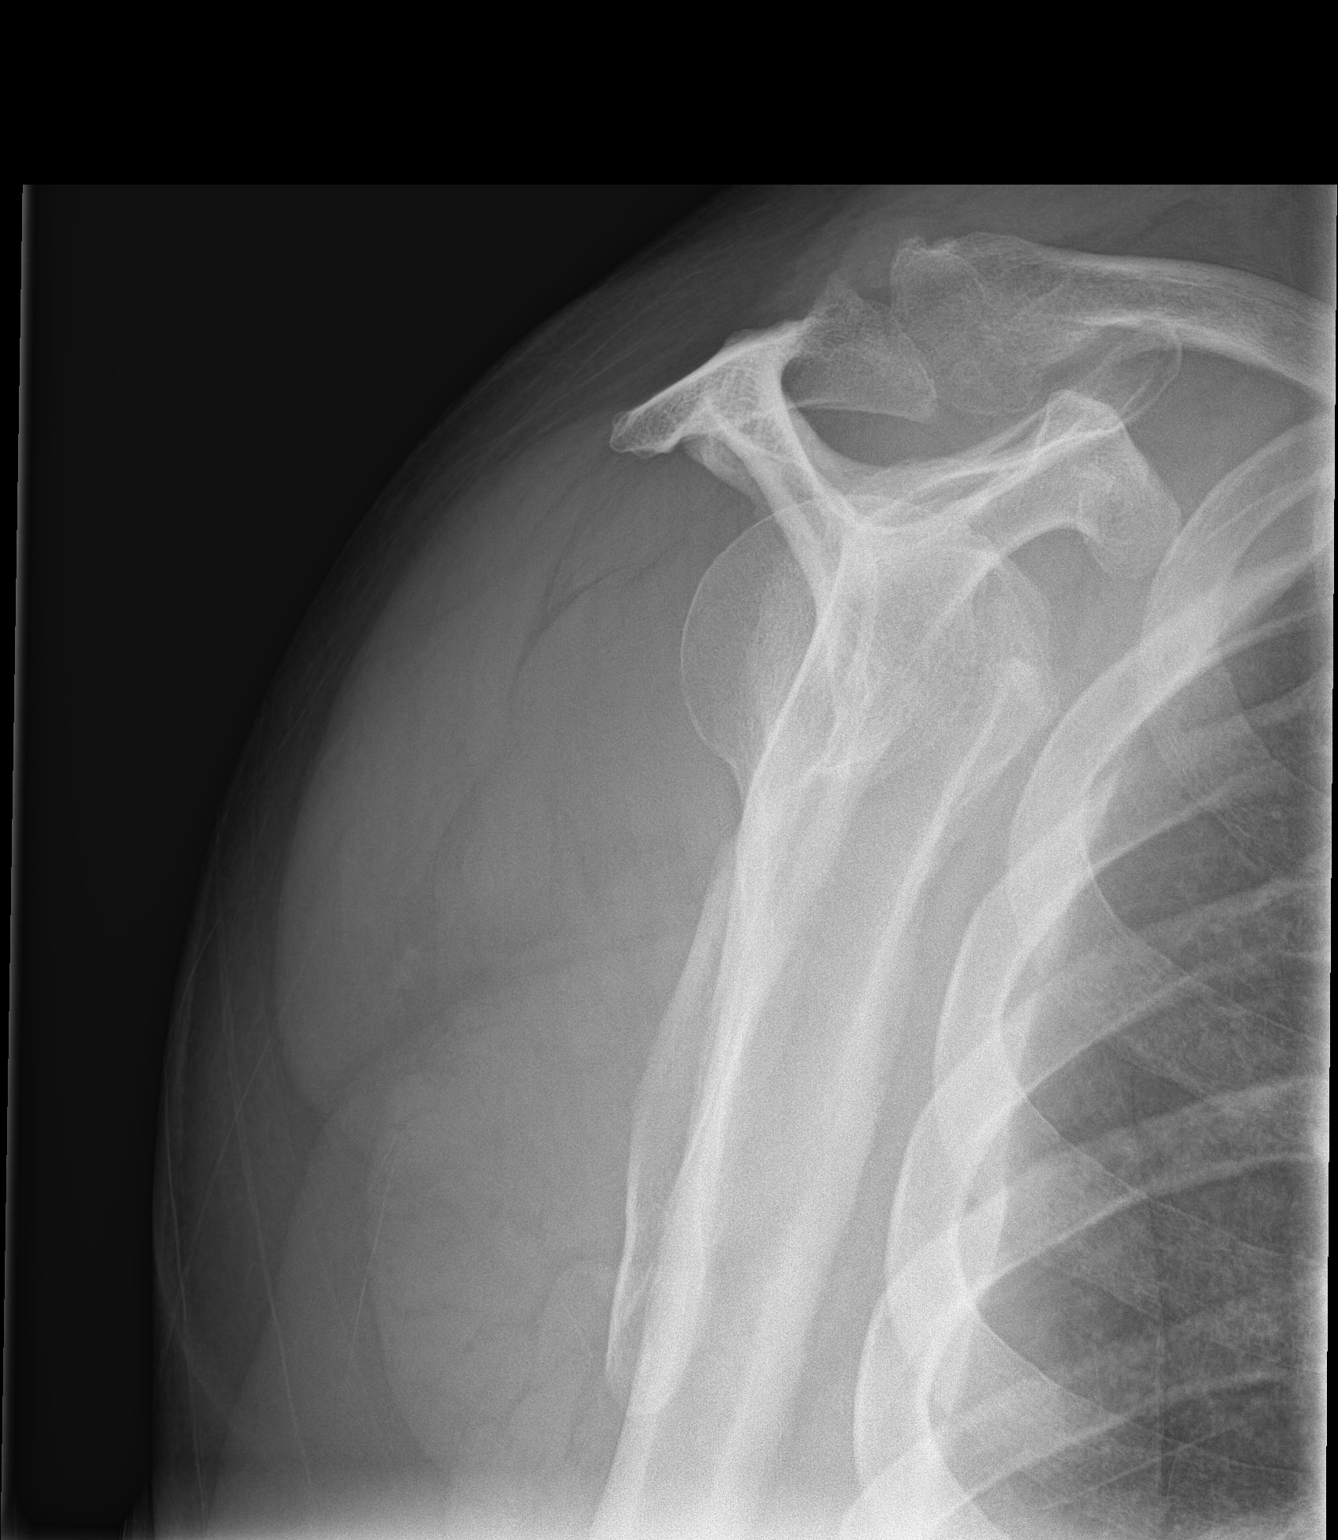

[shoulder axial]
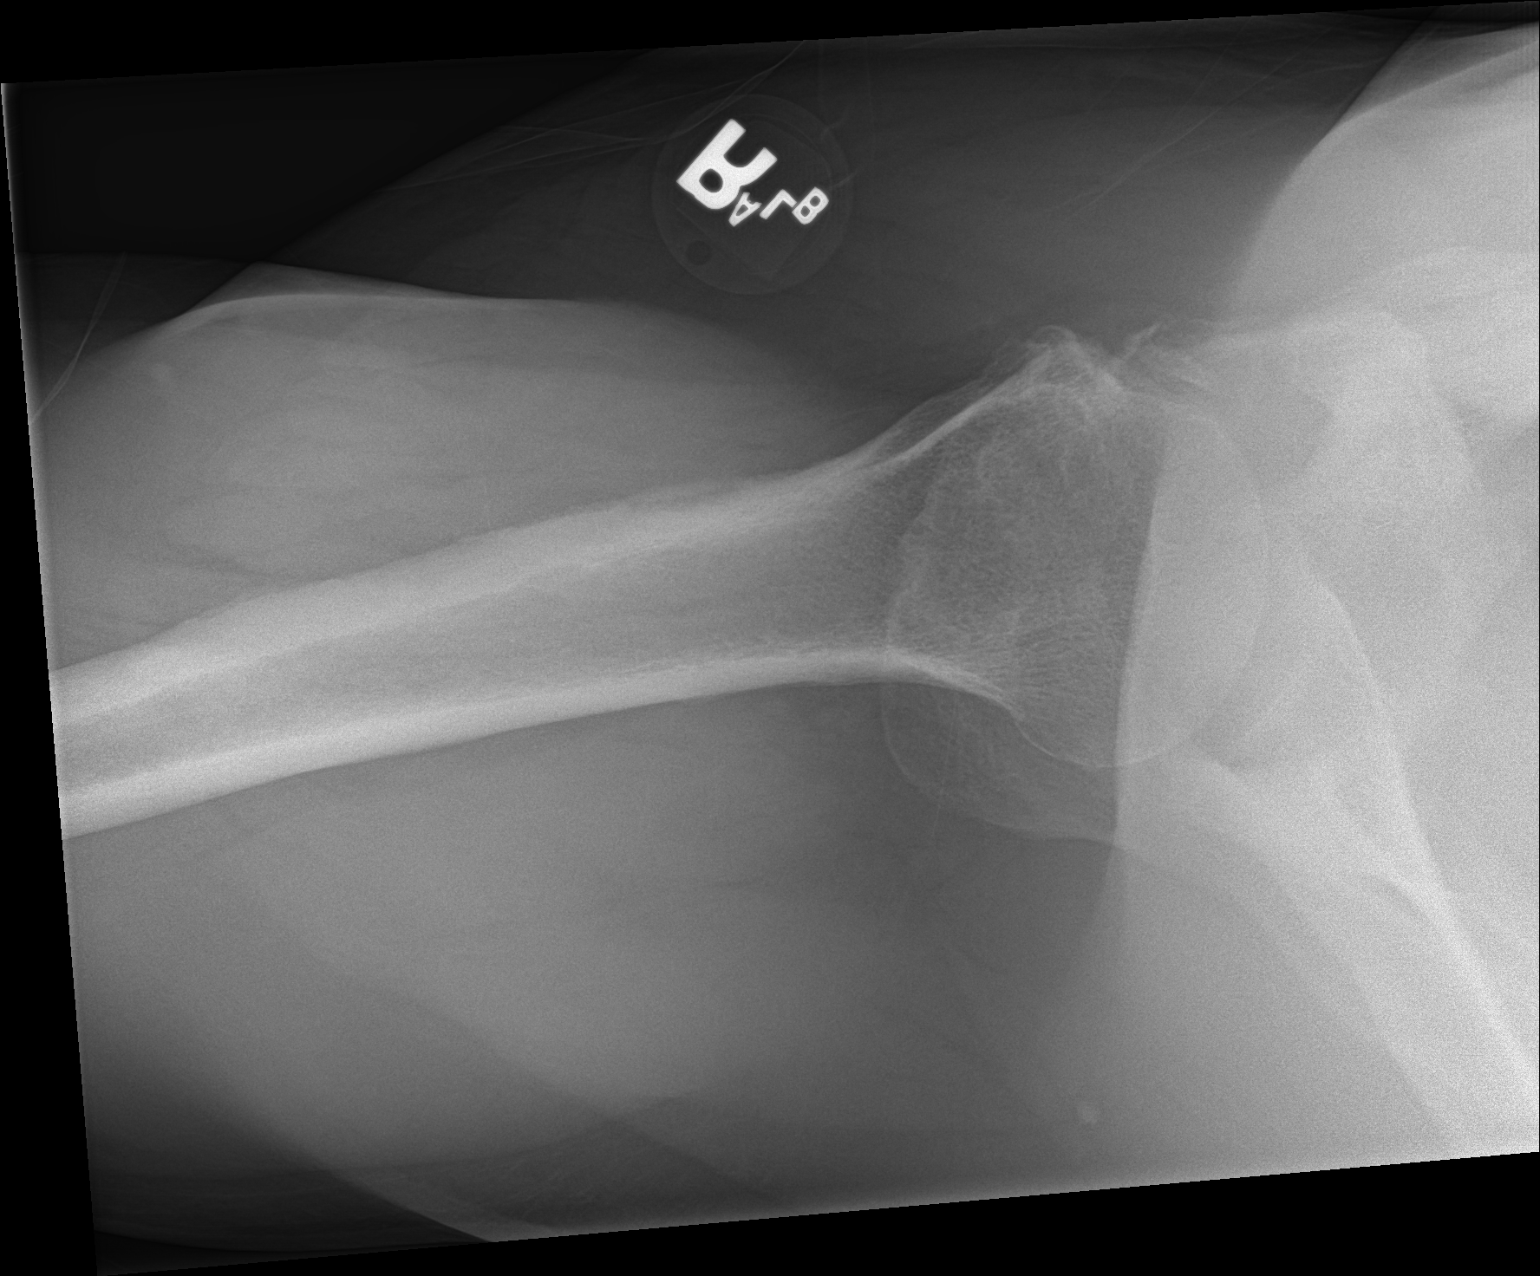

[3 of 3 positions shown; findings below may reference images not displayed]

FINDINGS: Mild degenerative changes of the acromioclavicular joint and
glenohumeral joint are noted. No acute fracture or dislocation is
seen. The underlying bony thorax is within normal limits.
IMPRESSION: No acute abnormality is noted.  Degenerative changes are seen.
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
## 2018-01-25 NOTE — ED Triage Notes (Signed)
Pt was outside and he tripped on a branch and felt on his right shoulder. Pt is c/o pain 5/10 to shoulder

## 2018-01-25 NOTE — Discharge Instructions (Signed)
Ice. Use sling. Range of motion as discussed.  Rest. Drink plenty of fluids.   Follow up with orthopedic this week as discussed, See above to call today to schedule.   Follow up with your primary care physician this week as needed. Return to Urgent care for new or worsening concerns.

## 2018-03-23 ENCOUNTER — Other Ambulatory Visit: Payer: Self-pay | Admitting: Internal Medicine

## 2018-03-23 NOTE — Telephone Encounter (Signed)
[place] Controlled Substance Database checked. Last filled on 02/16/18 

## 2018-05-11 ENCOUNTER — Telehealth: Payer: Self-pay | Admitting: Internal Medicine

## 2018-05-11 NOTE — Telephone Encounter (Signed)
Copied from [place] [id number]. Topic: Quick Communication - See Telephone Encounter >> May 11, 2018 11:36 AM Percell Belt A wrote: CRM for notification. See Telephone encounter for: 05/11/18.  Pt called in and stated that he is in the donut hole and his Testosterone 20.25 MG/ACT (1.62%) GEL [[id number] is over $331.00 and could not afford to pick this up?  What her his options?    Pharmacy - [facility] on [address]  Best number for pt - [phone]

## 2018-05-12 NOTE — Telephone Encounter (Signed)
Please contact pt and let him know.

## 2018-05-12 NOTE — Telephone Encounter (Signed)
LVM letting pt know response below. Asked pt to call back with any questions or concerns.

## 2018-05-12 NOTE — Telephone Encounter (Signed)
He should consider calling his insurance and finding out what options are covered

## 2018-06-14 ENCOUNTER — Other Ambulatory Visit: Payer: Self-pay | Admitting: Internal Medicine

## 2018-06-14 NOTE — Telephone Encounter (Signed)
Last OV was 01/05/18 Next OV is 07/08/18

## 2018-07-03 ENCOUNTER — Other Ambulatory Visit: Payer: Self-pay | Admitting: Internal Medicine

## 2018-07-07 NOTE — Patient Instructions (Addendum)
Your BP should be consistently less than 140/90.  Tests ordered today. Your results will be released to [place] (or called to you) after review, usually within 72hours after test completion. If any changes need to be made, you will be notified at that same time.  Flu immunization administered today.    Medications reviewed and updated.  Changes include :   none   Please followup in 6 months
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
## 2018-07-08 ENCOUNTER — Ambulatory Visit (INDEPENDENT_AMBULATORY_CARE_PROVIDER_SITE_OTHER): Payer: Medicare HMO | Admitting: Internal Medicine

## 2018-07-08 ENCOUNTER — Other Ambulatory Visit (INDEPENDENT_AMBULATORY_CARE_PROVIDER_SITE_OTHER): Payer: Medicare HMO

## 2018-07-08 ENCOUNTER — Encounter: Payer: Self-pay | Admitting: Internal Medicine

## 2018-07-08 VITALS — BP 162/90 | HR 63 | Temp 98.6°F | Resp 16 | Ht 71.0 in | Wt 267.0 lb

## 2018-07-08 DIAGNOSIS — R7303 Prediabetes: Secondary | ICD-10-CM

## 2018-07-08 DIAGNOSIS — E349 Endocrine disorder, unspecified: Secondary | ICD-10-CM | POA: Diagnosis not present

## 2018-07-08 DIAGNOSIS — E038 Other specified hypothyroidism: Secondary | ICD-10-CM

## 2018-07-08 DIAGNOSIS — E785 Hyperlipidemia, unspecified: Secondary | ICD-10-CM

## 2018-07-08 DIAGNOSIS — I10 Essential (primary) hypertension: Secondary | ICD-10-CM

## 2018-07-08 DIAGNOSIS — Z23 Encounter for immunization: Secondary | ICD-10-CM

## 2018-07-08 LAB — CBC WITH DIFFERENTIAL/PLATELET
Basophils Absolute: 0 10*3/uL (ref 0.0–0.1)
Basophils Relative: 0.5 % (ref 0.0–3.0)
EOS ABS: 0.2 10*3/uL (ref 0.0–0.7)
Eosinophils Relative: 3.1 % (ref 0.0–5.0)
HCT: 42.3 % (ref 39.0–52.0)
Hemoglobin: 15.1 g/dL (ref 13.0–17.0)
LYMPHS ABS: 2.1 10*3/uL (ref 0.7–4.0)
Lymphocytes Relative: 28.4 % (ref 12.0–46.0)
MCHC: 35.6 g/dL (ref 30.0–36.0)
MCV: 92.7 fl (ref 78.0–100.0)
MONO ABS: 0.5 10*3/uL (ref 0.1–1.0)
Monocytes Relative: 6.8 % (ref 3.0–12.0)
Neutro Abs: 4.6 10*3/uL (ref 1.4–7.7)
Neutrophils Relative %: 61.2 % (ref 43.0–77.0)
Platelets: 257 10*3/uL (ref 150.0–400.0)
RBC: 4.57 Mil/uL (ref 4.22–5.81)
RDW: 13.6 % (ref 11.5–15.5)
WBC: 7.5 10*3/uL (ref 4.0–10.5)

## 2018-07-08 LAB — LIPID PANEL
CHOLESTEROL: 173 mg/dL (ref 0–200)
HDL: 33.5 mg/dL — ABNORMAL LOW (ref >39.00)
LDL Cholesterol: 115 mg/dL — ABNORMAL HIGH (ref 0–99)
NonHDL: 139.59
TRIGLYCERIDES: 125 mg/dL (ref 0.0–149.0)
Total CHOL/HDL Ratio: 5
VLDL: 25 mg/dL (ref 0.0–40.0)

## 2018-07-08 LAB — COMPREHENSIVE METABOLIC PANEL
ALBUMIN: 4.2 g/dL (ref 3.5–5.2)
ALK PHOS: 50 U/L (ref 39–117)
ALT: 32 U/L (ref 0–53)
AST: 26 U/L (ref 0–37)
BUN: 18 mg/dL (ref 6–23)
CO2: 24 mEq/L (ref 19–32)
Calcium: 9.4 mg/dL (ref 8.4–10.5)
Chloride: 103 mEq/L (ref 96–112)
Creatinine, Ser: 1.21 mg/dL (ref 0.40–1.50)
GFR: 63.79 mL/min (ref >60.00)
GLUCOSE: 101 mg/dL — AB (ref 70–99)
Potassium: 4.1 mEq/L (ref 3.5–5.1)
Sodium: 136 mEq/L (ref 135–145)
TOTAL PROTEIN: 7.2 g/dL (ref 6.0–8.3)
Total Bilirubin: 0.6 mg/dL (ref 0.2–1.2)

## 2018-07-08 LAB — TSH: TSH: 0.67 u[IU]/mL (ref 0.35–4.50)

## 2018-07-08 LAB — HEMOGLOBIN A1C: HEMOGLOBIN A1C: 5.8 % (ref 4.6–6.5)

## 2018-07-08 MED ORDER — TESTOSTERONE 20.25 MG/ACT (1.62%) TD GEL: TRANSDERMAL | 1 refills | Status: DC

## 2018-07-08 NOTE — Assessment & Plan Note (Signed)
Check lipid panel  Continue daily statin Regular exercise and healthy diet encouraged  

## 2018-07-08 NOTE — Assessment & Plan Note (Signed)
Blood pressure elevated here, but is well controlled at home He will continue to monitor closely at home-discussed goal of less than 140/90 consistently Continue current medications at current doses CMP

## 2018-07-08 NOTE — Assessment & Plan Note (Signed)
Wants to continue testosterone despite being in the donut hole-prescription likely cheaper through [facility], which she will try with his next refill Discussed that we can consider changing to injections, which would likely be cheaper in the future Check CBC, CMP, testosterone level

## 2018-07-08 NOTE — Assessment & Plan Note (Signed)
Clinically euthyroid Check tsh  Titrate med dose if needed  

## 2018-07-08 NOTE — Assessment & Plan Note (Signed)
Check a1c Low sugar / carb diet Stressed regular exercise   

## 2018-07-13 LAB — TESTOSTERONE, FREE & TOTAL
FREE TESTOSTERONE: 242.5 pg/mL — AB (ref 35.0–155.0)
TESTOSTERONE, TOTAL, LC-MS-MS: 994 ng/dL (ref 250–1100)

## 2018-10-03 ENCOUNTER — Other Ambulatory Visit: Payer: Self-pay | Admitting: Internal Medicine

## 2018-11-03 ENCOUNTER — Ambulatory Visit: Payer: Self-pay

## 2018-11-03 NOTE — Telephone Encounter (Signed)
Patient called in with c/o "abdominal pain." He says "it started about a week ago and has gotten worse over the past few days. As long as I'm still, I don't feel the pain. As soon as I start moving, the pain hits, a moderate pain. The pain is to the lower right side of my abdomen and doesn't move anywhere else." I asked about other symptoms, he denies. According to protocol, see PCP within 24 hours, no availability with PCP. Patient says he only wants to see Dr. Quay Burow and will wait until Friday. I called the office and spoke to Lewis, [facility] who says she will have the patient schedule the patient on Friday, 11/05/18 at 91 with Dr. Quay Burow. Patient advised, care advice given, he verbalized understanding.    Reason for Disposition . Age > 60 years  Answer Assessment - Initial Assessment Questions 1. LOCATION: "Where does it hurt?"      Lower abdomen on the right side 2. RADIATION: "Does the pain shoot anywhere else?" (e.g., chest, back)     No 3. ONSET: "When did the pain begin?" (Minutes, hours or days ago)      1 week or so 4. SUDDEN: "Gradual or sudden onset?"     Gradually gotten worse 5. PATTERN "Does the pain come and go, or is it constant?"    - If constant: "Is it getting better, staying the same, or worsening?"      (Note: Constant means the pain never goes away completely; most serious pain is constant and it progresses)     - If intermittent: "How long does it last?" "Do you have pain now?"     (Note: Intermittent means the pain goes away completely between bouts)     Hurts when up moving 6. SEVERITY: "How bad is the pain?"  (e.g., Scale 1-10; mild, moderate, or severe)    - MILD (1-3): doesn't interfere with normal activities, abdomen soft and not tender to touch     - MODERATE (4-7): interferes with normal activities or awakens from sleep, tender to touch     - SEVERE (8-10): excruciating pain, doubled over, unable to do any normal activities       Moderate 7. RECURRENT SYMPTOM:  "Have you ever had this type of abdominal pain before?" If so, ask: "When was the last time?" and "What happened that time?"      No 8. CAUSE: "What do you think is causing the abdominal pain?"     I don't know 9. RELIEVING/AGGRAVATING FACTORS: "What makes it better or worse?" (e.g., movement, antacids, bowel movement)     Movement 10. OTHER SYMPTOMS: "Has there been any vomiting, diarrhea, constipation, or urine problems?"      No  Protocols used: ABDOMINAL PAIN - MALE-A-AH
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
## 2018-11-05 ENCOUNTER — Encounter: Payer: Self-pay | Admitting: Internal Medicine

## 2018-11-05 ENCOUNTER — Ambulatory Visit (INDEPENDENT_AMBULATORY_CARE_PROVIDER_SITE_OTHER): Payer: Medicare HMO | Admitting: Internal Medicine

## 2018-11-05 VITALS — BP 160/70 | HR 70 | Temp 98.7°F | Resp 18 | Ht 71.0 in | Wt 272.0 lb

## 2018-11-05 DIAGNOSIS — R1031 Right lower quadrant pain: Secondary | ICD-10-CM | POA: Insufficient documentation

## 2018-11-05 NOTE — Patient Instructions (Addendum)
Since your pain is getting better continue to monitor it.  If it does not resolve completely or gets worse let me know.   We can do imaging or have you see Dr Conners again.      Monitor your BP at home and bring in your cuff to your next appointment.

## 2018-11-05 NOTE — Assessment & Plan Note (Signed)
Right lower quadrant pain was intense 2 days ago and had components of musculoskeletal nature, but also could have been possible kidney stone, his right inguinal hernia that was recently repaired or gas His pain has gotten much better and he only has mild soreness now Discussed that we could do further evaluation with imaging or have him see Dr. Amado Coe who did surgery on him last year For now he will just monitor and let me know if his pain does not completely resolve or worsens

## 2018-12-10 ENCOUNTER — Other Ambulatory Visit: Payer: Self-pay | Admitting: Internal Medicine

## 2018-12-10 NOTE — Telephone Encounter (Signed)
Last OV 07/08/18  [place] Controlled Substance Database checked. Last filled on 10/27/2018

## 2019-01-02 ENCOUNTER — Other Ambulatory Visit: Payer: Self-pay | Admitting: Internal Medicine

## 2019-01-06 NOTE — Patient Instructions (Addendum)
Your BP should be less than 140/90  Tests ordered today. Your results will be released to [facility] (or called to you) after review, usually within 72hours after test completion. If any changes need to be made, you will be notified at that same time.  Prevnar pneumonia immunization administered today.   Medications reviewed and updated.  Changes include :   None   Your prescription(s) have been submitted to your pharmacy. Please take as directed and contact our office if you believe you are having problem(s) with the medication(s).   Please followup in 6 months
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
## 2019-01-07 ENCOUNTER — Other Ambulatory Visit (INDEPENDENT_AMBULATORY_CARE_PROVIDER_SITE_OTHER): Payer: Medicare HMO

## 2019-01-07 ENCOUNTER — Ambulatory Visit (INDEPENDENT_AMBULATORY_CARE_PROVIDER_SITE_OTHER): Payer: Medicare HMO | Admitting: Internal Medicine

## 2019-01-07 ENCOUNTER — Other Ambulatory Visit: Payer: Self-pay

## 2019-01-07 ENCOUNTER — Encounter: Payer: Self-pay | Admitting: Internal Medicine

## 2019-01-07 VITALS — BP 168/100 | HR 58 | Temp 98.6°F | Resp 16 | Ht 71.0 in | Wt 266.0 lb

## 2019-01-07 DIAGNOSIS — E038 Other specified hypothyroidism: Secondary | ICD-10-CM | POA: Diagnosis not present

## 2019-01-07 DIAGNOSIS — R7303 Prediabetes: Secondary | ICD-10-CM | POA: Diagnosis not present

## 2019-01-07 DIAGNOSIS — Z23 Encounter for immunization: Secondary | ICD-10-CM | POA: Diagnosis not present

## 2019-01-07 DIAGNOSIS — E349 Endocrine disorder, unspecified: Secondary | ICD-10-CM | POA: Diagnosis not present

## 2019-01-07 DIAGNOSIS — I10 Essential (primary) hypertension: Secondary | ICD-10-CM

## 2019-01-07 DIAGNOSIS — E785 Hyperlipidemia, unspecified: Secondary | ICD-10-CM | POA: Diagnosis not present

## 2019-01-07 DIAGNOSIS — Z6837 Body mass index (BMI) 37.0-37.9, adult: Secondary | ICD-10-CM | POA: Diagnosis not present

## 2019-01-07 LAB — COMPREHENSIVE METABOLIC PANEL
ALBUMIN: 4.6 g/dL (ref 3.5–5.2)
ALK PHOS: 51 U/L (ref 39–117)
ALT: 30 U/L (ref 0–53)
AST: 28 U/L (ref 0–37)
BILIRUBIN TOTAL: 0.8 mg/dL (ref 0.2–1.2)
BUN: 20 mg/dL (ref 6–23)
CO2: 26 mEq/L (ref 19–32)
Calcium: 9.4 mg/dL (ref 8.4–10.5)
Chloride: 103 mEq/L (ref 96–112)
Creatinine, Ser: 1.39 mg/dL (ref 0.40–1.50)
GFR: 51.06 mL/min — ABNORMAL LOW (ref >60.00)
Glucose, Bld: 102 mg/dL — ABNORMAL HIGH (ref 70–99)
Potassium: 4.7 mEq/L (ref 3.5–5.1)
Sodium: 139 mEq/L (ref 135–145)
Total Protein: 7.3 g/dL (ref 6.0–8.3)

## 2019-01-07 LAB — LIPID PANEL
Cholesterol: 214 mg/dL — ABNORMAL HIGH (ref 0–200)
HDL: 37.6 mg/dL — ABNORMAL LOW (ref >39.00)
LDL Cholesterol: 154 mg/dL — ABNORMAL HIGH (ref 0–99)
NonHDL: 176.31
Total CHOL/HDL Ratio: 6
Triglycerides: 111 mg/dL (ref 0.0–149.0)
VLDL: 22.2 mg/dL (ref 0.0–40.0)

## 2019-01-07 LAB — CBC WITH DIFFERENTIAL/PLATELET
Basophils Absolute: 0.1 10*3/uL (ref 0.0–0.1)
Basophils Relative: 1.1 % (ref 0.0–3.0)
Eosinophils Absolute: 0.3 10*3/uL (ref 0.0–0.7)
Eosinophils Relative: 3.9 % (ref 0.0–5.0)
HCT: 45.9 % (ref 39.0–52.0)
Hemoglobin: 15.9 g/dL (ref 13.0–17.0)
LYMPHS PCT: 36.6 % (ref 12.0–46.0)
Lymphs Abs: 2.6 10*3/uL (ref 0.7–4.0)
MCHC: 34.6 g/dL (ref 30.0–36.0)
MCV: 94 fl (ref 78.0–100.0)
Monocytes Absolute: 0.5 10*3/uL (ref 0.1–1.0)
Monocytes Relative: 7.2 % (ref 3.0–12.0)
Neutro Abs: 3.7 10*3/uL (ref 1.4–7.7)
Neutrophils Relative %: 51.2 % (ref 43.0–77.0)
Platelets: 255 10*3/uL (ref 150.0–400.0)
RBC: 4.88 Mil/uL (ref 4.22–5.81)
RDW: 13.5 % (ref 11.5–15.5)
WBC: 7.2 10*3/uL (ref 4.0–10.5)

## 2019-01-07 LAB — PSA, MEDICARE: PSA: 0.46 ng/ml (ref 0.10–4.00)

## 2019-01-07 LAB — HEMOGLOBIN A1C: HEMOGLOBIN A1C: 5.5 % (ref 4.6–6.5)

## 2019-01-07 LAB — TSH: TSH: 2.19 u[IU]/mL (ref 0.35–4.50)

## 2019-01-07 MED ORDER — TESTOSTERONE 20.25 MG/ACT (1.62%) TD GEL: TRANSDERMAL | 5 refills | Status: DC

## 2019-01-07 NOTE — Assessment & Plan Note (Signed)
Check lipid panel  Lifestyle controlled Regular exercise and healthy diet encouraged  

## 2019-01-07 NOTE — Assessment & Plan Note (Signed)
Clinically euthyroid Check tsh  Titrate med dose if needed  

## 2019-01-07 NOTE — Assessment & Plan Note (Signed)
Check a1c Low sugar / carb diet Stressed regular exercise   

## 2019-01-07 NOTE — Addendum Note (Signed)
Addended by: Delice Bison E on: 01/07/2019 09:38 AM   Modules accepted: Orders

## 2019-01-07 NOTE — Assessment & Plan Note (Signed)
Feels like the medication is at a good dose Cbc, testosterone, psa Will send to pharmacy - almost due for a refill He may look into cheaper options

## 2019-01-07 NOTE — Assessment & Plan Note (Signed)
High here today, but typically controlled at home  Home BP cuff was high here today as well He will monitor closely over the next week and let me know if over 140/90 so we can revise medication cmp

## 2019-01-07 NOTE — Assessment & Plan Note (Signed)
With prediabetes, htn Active at work Eating pretty well Work on weight loss

## 2019-01-10 LAB — TESTOSTERONE, FREE & TOTAL
Free Testosterone: 134.7 pg/mL (ref 35.0–155.0)
Testosterone, Total, LC-MS-MS: 761 ng/dL (ref 250–1100)

## 2019-01-12 ENCOUNTER — Other Ambulatory Visit: Payer: Self-pay | Admitting: Internal Medicine

## 2019-01-12 DIAGNOSIS — E782 Mixed hyperlipidemia: Secondary | ICD-10-CM

## 2019-01-12 MED ORDER — ATORVASTATIN CALCIUM 20 MG PO TABS: 20.0000 mg | ORAL_TABLET | Freq: Every day | ORAL | 3 refills | Status: DC

## 2019-02-19 ENCOUNTER — Other Ambulatory Visit: Payer: Self-pay | Admitting: Internal Medicine

## 2019-02-21 NOTE — Telephone Encounter (Signed)
Last routine FU was 01/07/19  Last refill was 01/07/19

## 2019-02-25 ENCOUNTER — Telehealth: Payer: Self-pay | Admitting: Internal Medicine

## 2019-02-25 NOTE — Telephone Encounter (Signed)
He should stop the medication and make sure the leg pain goes away completely.  If the leg pain goes away completely we can consider starting a different medication at a very low dose if you would like.

## 2019-02-25 NOTE — Telephone Encounter (Signed)
Wife aware of response.  

## 2019-02-25 NOTE — Telephone Encounter (Signed)
Copied from [place] [id number]. Topic: Quick Communication - Rx Refill/Question >> Feb 25, 2019  2:20 PM Rayann Heman wrote: Medication: atorvastatin (LIPITOR) 20 MG tablet [[id number]]  pt wife called and stated that he is having leg pain with medication and would like to know what he shpu;d do. Please advise

## 2019-04-02 ENCOUNTER — Encounter: Payer: Self-pay | Admitting: Family Medicine

## 2019-04-02 ENCOUNTER — Other Ambulatory Visit: Payer: Self-pay

## 2019-04-02 ENCOUNTER — Ambulatory Visit (INDEPENDENT_AMBULATORY_CARE_PROVIDER_SITE_OTHER): Payer: Medicare HMO | Admitting: Family Medicine

## 2019-04-02 DIAGNOSIS — J0110 Acute frontal sinusitis, unspecified: Secondary | ICD-10-CM | POA: Diagnosis not present

## 2019-04-02 MED ORDER — AMOXICILLIN-POT CLAVULANATE 875-125 MG PO TABS: 1.0000 | ORAL_TABLET | Freq: Two times a day (BID) | ORAL | 0 refills | Status: DC

## 2019-04-02 MED ORDER — PREDNISONE 20 MG PO TABS: 40.0000 mg | ORAL_TABLET | Freq: Every day | ORAL | 0 refills | Status: AC

## 2019-04-02 NOTE — Progress Notes (Signed)
CC: Sinus infection  Charles [name] here for URI complaints. Due to COVID-19 pandemic, we are interacting via web portal for an electronic face-to-face visit. I verified patient's ID using 2 identifiers. Patient agreed to proceed with visit via this method. Patient is in a parked car, I am at office. Patient and I are present for visit.    Duration: 3 days  Associated symptoms: sinus congestion, sinus pain and ear pain Denies: rhinorrhea, itchy watery eyes, ear drainage, sore throat, wheezing, shortness of breath, myalgia, coughing, recent travel and fevers Treatment to date: Flonase x 1 yesterday Sick contacts: No  ROS:  Const: Denies fevers HEENT: As noted in HPI Lungs: No SOB  Past Medical History:  Diagnosis Date  . Arthritis   . Cataract    extractions r side  . GERD (gastroesophageal reflux disease)   . H/O renal calculi     X 2  . History of kidney stones   . Hyperlipidemia   . Hypertension   . Hypothyroid    Exam No conversational dyspnea Age appropriate judgment and insight Nml affect and mood  Acute frontal sinusitis, recurrence not specified - Plan: predniSONE (DELTASONE) 20 MG tablet, amoxicillin-clavulanate (AUGMENTIN) 875-125 MG tablet; pred burst for 5 d. If no improvement, pocket rx for Augmentin given. I told him he will likely not need it. Push fluids, Tylenol. OK to cont INCS.  F/u prn. If starting to experience fevers, shaking, or shortness of breath, seek immediate care. Pt voiced understanding and agreement to the plan.  [place], DO 04/02/19 9:47 AM

## 2019-04-30 ENCOUNTER — Other Ambulatory Visit: Payer: Self-pay | Admitting: Internal Medicine

## 2019-05-02 NOTE — Telephone Encounter (Signed)
Last refill was 03/26/19 Last OV 01/07/19 Next OV 07/11/19

## 2019-07-10 NOTE — Patient Instructions (Addendum)
  Tests ordered today. Your results will be released to [facility] (or called to you) after review.  If any changes need to be made, you will be notified at that same time.  Flu immunization administered today.    Medications reviewed and updated.  Changes include :   Increase lisinopril to 20 mg daily.  Start a new cholesterol medication - crestor 5 mg daily.   Your prescription(s) have been submitted to your pharmacy. Please take as directed and contact our office if you believe you are having problem(s) with the medication(s).   Please followup in 6 months  See Dr Tamala Julian for your right shoulder pain.
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
## 2019-07-11 ENCOUNTER — Other Ambulatory Visit (INDEPENDENT_AMBULATORY_CARE_PROVIDER_SITE_OTHER): Payer: Medicare HMO

## 2019-07-11 ENCOUNTER — Other Ambulatory Visit: Payer: Self-pay

## 2019-07-11 ENCOUNTER — Ambulatory Visit (INDEPENDENT_AMBULATORY_CARE_PROVIDER_SITE_OTHER): Payer: Medicare HMO | Admitting: Internal Medicine

## 2019-07-11 ENCOUNTER — Encounter: Payer: Self-pay | Admitting: Internal Medicine

## 2019-07-11 ENCOUNTER — Other Ambulatory Visit: Payer: Self-pay | Admitting: Internal Medicine

## 2019-07-11 VITALS — BP 152/94 | HR 62 | Temp 98.7°F | Resp 18 | Ht 71.0 in | Wt 273.0 lb

## 2019-07-11 DIAGNOSIS — I10 Essential (primary) hypertension: Secondary | ICD-10-CM

## 2019-07-11 DIAGNOSIS — R7303 Prediabetes: Secondary | ICD-10-CM

## 2019-07-11 DIAGNOSIS — G8929 Other chronic pain: Secondary | ICD-10-CM

## 2019-07-11 DIAGNOSIS — M25511 Pain in right shoulder: Secondary | ICD-10-CM | POA: Diagnosis not present

## 2019-07-11 DIAGNOSIS — Z6837 Body mass index (BMI) 37.0-37.9, adult: Secondary | ICD-10-CM | POA: Diagnosis not present

## 2019-07-11 DIAGNOSIS — E349 Endocrine disorder, unspecified: Secondary | ICD-10-CM | POA: Diagnosis not present

## 2019-07-11 DIAGNOSIS — Z23 Encounter for immunization: Secondary | ICD-10-CM

## 2019-07-11 DIAGNOSIS — E782 Mixed hyperlipidemia: Secondary | ICD-10-CM | POA: Diagnosis not present

## 2019-07-11 DIAGNOSIS — E038 Other specified hypothyroidism: Secondary | ICD-10-CM

## 2019-07-11 LAB — COMPREHENSIVE METABOLIC PANEL
ALT: 24 U/L (ref 0–53)
AST: 20 U/L (ref 0–37)
Albumin: 4.4 g/dL (ref 3.5–5.2)
Alkaline Phosphatase: 50 U/L (ref 39–117)
BUN: 21 mg/dL (ref 6–23)
CO2: 28 mEq/L (ref 19–32)
Calcium: 9.9 mg/dL (ref 8.4–10.5)
Chloride: 103 mEq/L (ref 96–112)
Creatinine, Ser: 1.39 mg/dL (ref 0.40–1.50)
GFR: 50.99 mL/min — ABNORMAL LOW (ref >60.00)
Glucose, Bld: 98 mg/dL (ref 70–99)
Potassium: 4.7 mEq/L (ref 3.5–5.1)
Sodium: 138 mEq/L (ref 135–145)
Total Bilirubin: 0.5 mg/dL (ref 0.2–1.2)
Total Protein: 7 g/dL (ref 6.0–8.3)

## 2019-07-11 LAB — CBC WITH DIFFERENTIAL/PLATELET
Basophils Absolute: 0.1 10*3/uL (ref 0.0–0.1)
Basophils Relative: 1.1 % (ref 0.0–3.0)
Eosinophils Absolute: 0.3 10*3/uL (ref 0.0–0.7)
Eosinophils Relative: 3.8 % (ref 0.0–5.0)
HCT: 44.7 % (ref 39.0–52.0)
Hemoglobin: 15.4 g/dL (ref 13.0–17.0)
Lymphocytes Relative: 31 % (ref 12.0–46.0)
Lymphs Abs: 2.3 10*3/uL (ref 0.7–4.0)
MCHC: 34.5 g/dL (ref 30.0–36.0)
MCV: 93.7 fl (ref 78.0–100.0)
Monocytes Absolute: 0.5 10*3/uL (ref 0.1–1.0)
Monocytes Relative: 6.5 % (ref 3.0–12.0)
Neutro Abs: 4.4 10*3/uL (ref 1.4–7.7)
Neutrophils Relative %: 57.6 % (ref 43.0–77.0)
Platelets: 250 10*3/uL (ref 150.0–400.0)
RBC: 4.77 Mil/uL (ref 4.22–5.81)
RDW: 13.7 % (ref 11.5–15.5)
WBC: 7.6 10*3/uL (ref 4.0–10.5)

## 2019-07-11 LAB — LIPID PANEL
Cholesterol: 192 mg/dL (ref 0–200)
HDL: 34 mg/dL — ABNORMAL LOW (ref >39.00)
NonHDL: 157.69
Total CHOL/HDL Ratio: 6
Triglycerides: 215 mg/dL — ABNORMAL HIGH (ref 0.0–149.0)
VLDL: 43 mg/dL — ABNORMAL HIGH (ref 0.0–40.0)

## 2019-07-11 LAB — HEMOGLOBIN A1C: Hgb A1c MFr Bld: 5.9 % (ref 4.6–6.5)

## 2019-07-11 LAB — LDL CHOLESTEROL, DIRECT: Direct LDL: 124 mg/dL

## 2019-07-11 LAB — TSH: TSH: 1.42 u[IU]/mL (ref 0.35–4.50)

## 2019-07-11 MED ORDER — ROSUVASTATIN CALCIUM 5 MG PO TABS: 5.0000 mg | ORAL_TABLET | Freq: Every day | ORAL | 3 refills | Status: DC

## 2019-07-11 MED ORDER — LISINOPRIL 20 MG PO TABS: 20.0000 mg | ORAL_TABLET | Freq: Every day | ORAL | 1 refills | Status: DC

## 2019-07-11 MED ORDER — TESTOSTERONE 20.25 MG/ACT (1.62%) TD GEL: TRANSDERMAL | 1 refills | Status: DC

## 2019-07-11 NOTE — Assessment & Plan Note (Signed)
Advised weight loss Active, no exercise  Decrease portions

## 2019-07-11 NOTE — Assessment & Plan Note (Signed)
Clinically euthyroid Check tsh  Titrate med dose if needed  

## 2019-07-11 NOTE — Assessment & Plan Note (Signed)
BP elevated  Increase lisinopril to 20 mg daily Continue metoprolol at current dose cmp

## 2019-07-11 NOTE — Assessment & Plan Note (Addendum)
Check a1c Low sugar / carb diet Stressed regular exercise Advised weight loss  

## 2019-07-11 NOTE — Assessment & Plan Note (Signed)
Cbc, testosterone level Continue testosterone

## 2019-07-11 NOTE — Assessment & Plan Note (Addendum)
Did not tolerate statin - liptor caused leg aches - stopped lipids elevated Recheck lipids, cmp Start crestor 5 mg daily -- if not tolerated daily - can take 3/week

## 2019-07-14 LAB — TESTOSTERONE, FREE, TOTAL, SHBG
Sex Hormone Binding: 31.5 nmol/L (ref 19.3–76.4)
Testosterone, Free: 4.5 pg/mL — ABNORMAL LOW (ref 6.6–18.1)
Testosterone: 46 ng/dL — ABNORMAL LOW (ref 264–916)

## 2019-07-15 ENCOUNTER — Telehealth: Payer: Self-pay

## 2019-07-15 NOTE — Telephone Encounter (Signed)
Copied from [facility] [id number]. Topic: General - Other >> Jul 15, 2019 10:30 AM Carolyn Stare wrote:  Theadora Rama with Prior auth from [facility] call and said the diagnos was left off the phone .will fax the form over   [phone]   Testosterone 20.25 MG/ACT (1.62%) GEL

## 2019-07-15 NOTE — Telephone Encounter (Signed)
Noted  

## 2019-08-08 ENCOUNTER — Other Ambulatory Visit: Payer: Self-pay | Admitting: Internal Medicine

## 2019-08-09 ENCOUNTER — Ambulatory Visit: Payer: Medicare HMO | Admitting: Family Medicine

## 2019-08-09 ENCOUNTER — Ambulatory Visit: Payer: Self-pay

## 2019-08-09 ENCOUNTER — Other Ambulatory Visit: Payer: Self-pay

## 2019-08-09 ENCOUNTER — Encounter: Payer: Self-pay | Admitting: Family Medicine

## 2019-08-09 VITALS — BP 124/90 | HR 75 | Ht 71.0 in | Wt 267.0 lb

## 2019-08-09 DIAGNOSIS — M19012 Primary osteoarthritis, left shoulder: Secondary | ICD-10-CM

## 2019-08-09 DIAGNOSIS — M19011 Primary osteoarthritis, right shoulder: Secondary | ICD-10-CM | POA: Diagnosis not present

## 2019-08-09 DIAGNOSIS — M25511 Pain in right shoulder: Secondary | ICD-10-CM

## 2019-08-09 DIAGNOSIS — G8929 Other chronic pain: Secondary | ICD-10-CM

## 2019-08-09 DIAGNOSIS — M19019 Primary osteoarthritis, unspecified shoulder: Secondary | ICD-10-CM | POA: Insufficient documentation

## 2019-08-09 NOTE — Assessment & Plan Note (Signed)
Bilateral injections given today.  Patient does have some subacromial bursitis on the right shoulder likely also contributing.  We discussed with patient icing regimen, home exercises, which I consider which wants to avoid.  Patient is to increase activity slowly over the course the next several days.  Worsening symptoms will consider glenohumeral injections on the right side.  Hoping patient will do well with conservative therapy follow-up again in 6 to 8 weeks
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
## 2019-08-09 NOTE — Patient Instructions (Signed)
Good to see you.  Injected both AC joints today Ice 20 minutes 2 times daily. Usually after activity and before bed. Exercises 3 times a week.  Voltaren gel 2x daily Tart cherry extract 1200mg  at night Vitamin D 2000 IU daily  See me again in 6-8 weeks

## 2019-09-12 DIAGNOSIS — Z20828 Contact with and (suspected) exposure to other viral communicable diseases: Secondary | ICD-10-CM | POA: Diagnosis not present

## 2019-10-03 ENCOUNTER — Other Ambulatory Visit: Payer: Self-pay

## 2019-10-03 ENCOUNTER — Ambulatory Visit (INDEPENDENT_AMBULATORY_CARE_PROVIDER_SITE_OTHER): Payer: Medicare HMO | Admitting: Family Medicine

## 2019-10-03 ENCOUNTER — Encounter: Payer: Self-pay | Admitting: Family Medicine

## 2019-10-03 DIAGNOSIS — M19011 Primary osteoarthritis, right shoulder: Secondary | ICD-10-CM

## 2019-10-03 DIAGNOSIS — M19012 Primary osteoarthritis, left shoulder: Secondary | ICD-10-CM | POA: Diagnosis not present
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
## 2019-10-03 NOTE — Assessment & Plan Note (Signed)
Patient is doing significantly better at this time.  Discussed which activities to do patient is 75 to 80% better.  All retractors to do which was avoid f/u a sneeded

## 2019-11-11 ENCOUNTER — Other Ambulatory Visit: Payer: Self-pay | Admitting: Internal Medicine

## 2019-11-11 NOTE — Telephone Encounter (Signed)
[place] Controlled Database Checked Last filled: 10/06/19 # 150 LOV w/you: 07/11/19 Next appt w/you: 01/09/20

## 2019-12-16 ENCOUNTER — Other Ambulatory Visit: Payer: Self-pay | Admitting: Internal Medicine

## 2019-12-22 ENCOUNTER — Other Ambulatory Visit: Payer: Self-pay

## 2019-12-22 MED ORDER — METOPROLOL TARTRATE 25 MG PO TABS: 25.0000 mg | ORAL_TABLET | Freq: Two times a day (BID) | ORAL | 0 refills | Status: DC
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
## 2020-01-08 NOTE — Patient Instructions (Addendum)
  Blood work was ordered.    Pneumonia immunization administered today.    Medications reviewed and updated.  Changes include :   none  Your prescription(s) have been submitted to your pharmacy. Please take as directed and contact our office if you believe you are having problem(s) with the medication(s).   Please followup in 6 months

## 2020-01-09 ENCOUNTER — Other Ambulatory Visit: Payer: Self-pay

## 2020-01-09 ENCOUNTER — Ambulatory Visit (INDEPENDENT_AMBULATORY_CARE_PROVIDER_SITE_OTHER): Payer: Medicare HMO | Admitting: Internal Medicine

## 2020-01-09 ENCOUNTER — Encounter: Payer: Self-pay | Admitting: Internal Medicine

## 2020-01-09 VITALS — BP 142/78 | HR 73 | Temp 98.1°F | Resp 16 | Ht 71.0 in | Wt 273.0 lb

## 2020-01-09 DIAGNOSIS — Z6838 Body mass index (BMI) 38.0-38.9, adult: Secondary | ICD-10-CM | POA: Diagnosis not present

## 2020-01-09 DIAGNOSIS — Z23 Encounter for immunization: Secondary | ICD-10-CM

## 2020-01-09 DIAGNOSIS — Z125 Encounter for screening for malignant neoplasm of prostate: Secondary | ICD-10-CM

## 2020-01-09 DIAGNOSIS — R7303 Prediabetes: Secondary | ICD-10-CM | POA: Diagnosis not present

## 2020-01-09 DIAGNOSIS — I10 Essential (primary) hypertension: Secondary | ICD-10-CM

## 2020-01-09 DIAGNOSIS — E038 Other specified hypothyroidism: Secondary | ICD-10-CM

## 2020-01-09 DIAGNOSIS — E782 Mixed hyperlipidemia: Secondary | ICD-10-CM

## 2020-01-09 DIAGNOSIS — K4020 Bilateral inguinal hernia, without obstruction or gangrene, not specified as recurrent: Secondary | ICD-10-CM

## 2020-01-09 DIAGNOSIS — E349 Endocrine disorder, unspecified: Secondary | ICD-10-CM | POA: Diagnosis not present

## 2020-01-09 DIAGNOSIS — K4090 Unilateral inguinal hernia, without obstruction or gangrene, not specified as recurrent: Secondary | ICD-10-CM | POA: Insufficient documentation

## 2020-01-09 LAB — CBC WITH DIFFERENTIAL/PLATELET
Basophils Absolute: 0.1 10*3/uL (ref 0.0–0.1)
Basophils Relative: 1.2 % (ref 0.0–3.0)
Eosinophils Absolute: 0.3 10*3/uL (ref 0.0–0.7)
Eosinophils Relative: 4.3 % (ref 0.0–5.0)
HCT: 44.2 % (ref 39.0–52.0)
Hemoglobin: 15.4 g/dL (ref 13.0–17.0)
Lymphocytes Relative: 32.2 % (ref 12.0–46.0)
Lymphs Abs: 2.1 10*3/uL (ref 0.7–4.0)
MCHC: 34.9 g/dL (ref 30.0–36.0)
MCV: 93.3 fl (ref 78.0–100.0)
Monocytes Absolute: 0.5 10*3/uL (ref 0.1–1.0)
Monocytes Relative: 7.5 % (ref 3.0–12.0)
Neutro Abs: 3.6 10*3/uL (ref 1.4–7.7)
Neutrophils Relative %: 54.8 % (ref 43.0–77.0)
Platelets: 227 10*3/uL (ref 150.0–400.0)
RBC: 4.74 Mil/uL (ref 4.22–5.81)
RDW: 13.3 % (ref 11.5–15.5)
WBC: 6.6 10*3/uL (ref 4.0–10.5)

## 2020-01-09 LAB — COMPREHENSIVE METABOLIC PANEL
ALT: 31 U/L (ref 0–53)
AST: 28 U/L (ref 0–37)
Albumin: 4.4 g/dL (ref 3.5–5.2)
Alkaline Phosphatase: 54 U/L (ref 39–117)
BUN: 22 mg/dL (ref 6–23)
CO2: 26 mEq/L (ref 19–32)
Calcium: 9.5 mg/dL (ref 8.4–10.5)
Chloride: 102 mEq/L (ref 96–112)
Creatinine, Ser: 1.31 mg/dL (ref 0.40–1.50)
GFR: 54.51 mL/min — ABNORMAL LOW (ref >60.00)
Glucose, Bld: 119 mg/dL — ABNORMAL HIGH (ref 70–99)
Potassium: 4.5 mEq/L (ref 3.5–5.1)
Sodium: 136 mEq/L (ref 135–145)
Total Bilirubin: 0.9 mg/dL (ref 0.2–1.2)
Total Protein: 7.3 g/dL (ref 6.0–8.3)

## 2020-01-09 LAB — LIPID PANEL
Cholesterol: 137 mg/dL (ref 0–200)
HDL: 34.2 mg/dL — ABNORMAL LOW (ref >39.00)
LDL Cholesterol: 69 mg/dL (ref 0–99)
NonHDL: 102.64
Total CHOL/HDL Ratio: 4
Triglycerides: 169 mg/dL — ABNORMAL HIGH (ref 0.0–149.0)
VLDL: 33.8 mg/dL (ref 0.0–40.0)

## 2020-01-09 LAB — TSH: TSH: 1.55 u[IU]/mL (ref 0.35–4.50)

## 2020-01-09 LAB — PSA, MEDICARE: PSA: 0.42 ng/ml (ref 0.10–4.00)

## 2020-01-09 LAB — HEMOGLOBIN A1C: Hgb A1c MFr Bld: 5.6 % (ref 4.6–6.5)

## 2020-01-09 NOTE — Assessment & Plan Note (Signed)
Chronic Check a1c Low sugar / carb diet Stressed regular exercise  

## 2020-01-09 NOTE — Assessment & Plan Note (Signed)
Acute Having suprapubic pain Has been diagnosed with right side ? B/l inguinal hernia Will let me know when he is ready to see surgery.

## 2020-01-09 NOTE — Assessment & Plan Note (Signed)
Chronic  Clinically euthyroid Check tsh  Titrate med dose if needed  

## 2020-01-09 NOTE — Assessment & Plan Note (Signed)
Chronic Has gained weight Active, but not exercising snacking too much - advised to stop snacking and reduce portions Stressed weight loss

## 2020-01-09 NOTE — Assessment & Plan Note (Addendum)
Chronic °Check lipid panel  °Continue daily statin °Regular exercise and healthy diet encouraged °Advised weight loss °

## 2020-01-09 NOTE — Assessment & Plan Note (Signed)
Chronic Doing testosterone, but often rations due to cost Cbc, testosterone level, psa

## 2020-01-09 NOTE — Assessment & Plan Note (Signed)
Chronic BP well controlled Current regimen effective and well tolerated Continue current medications at current doses cmp  

## 2020-01-12 ENCOUNTER — Encounter: Payer: Self-pay | Admitting: Internal Medicine

## 2020-01-12 LAB — TESTOSTERONE, FREE & TOTAL
Free Testosterone: 88.4 pg/mL (ref 35.0–155.0)
Testosterone, Total, LC-MS-MS: 495 ng/dL (ref 250–1100)

## 2020-01-21 ENCOUNTER — Encounter (HOSPITAL_COMMUNITY): Payer: Self-pay

## 2020-01-21 ENCOUNTER — Ambulatory Visit (HOSPITAL_COMMUNITY)
Admission: EM | Admit: 2020-01-21 | Discharge: 2020-01-21 | Disposition: A | Discharge status: Home / Self Care | Payer: Medicare HMO | Attending: Emergency Medicine | Admitting: Emergency Medicine

## 2020-01-21 ENCOUNTER — Other Ambulatory Visit: Payer: Self-pay

## 2020-01-21 DIAGNOSIS — S0101XA Laceration without foreign body of scalp, initial encounter: Secondary | ICD-10-CM

## 2020-01-21 DIAGNOSIS — Z23 Encounter for immunization: Secondary | ICD-10-CM

## 2020-01-21 MED ORDER — TETANUS-DIPHTH-ACELL PERTUSSIS 5-2.5-18.5 LF-MCG/0.5 IM SUSP
INTRAMUSCULAR | Status: AC
  Filled 2020-01-21: qty 0.5

## 2020-01-21 MED ORDER — TETANUS-DIPHTH-ACELL PERTUSSIS 5-2.5-18.5 LF-MCG/0.5 IM SUSP
0.5000 mL | Freq: Once | INTRAMUSCULAR | Status: AC
  Administered 2020-01-21: 11:00:00 0.5 mL via INTRAMUSCULAR
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
## 2020-01-21 NOTE — Discharge Instructions (Signed)
Tetanus updated Keep clean and dry, if developing signs of infection- increased redness, swelling, pain or drainage please follow up Skin glue will gradually come off on its own

## 2020-01-21 NOTE — ED Triage Notes (Signed)
Pt c/o laceration to right side of head after hitting head on metal frame under house. Pt denies LOC. Bleeding controlled. Approx 2 inch lac noted. Pt cannot recall last tetanus but thinks he had booster less than 10 years ago.

## 2020-01-23 ENCOUNTER — Other Ambulatory Visit: Payer: Self-pay | Admitting: Internal Medicine

## 2020-02-01 DIAGNOSIS — M1990 Unspecified osteoarthritis, unspecified site: Secondary | ICD-10-CM | POA: Diagnosis not present

## 2020-02-01 DIAGNOSIS — E785 Hyperlipidemia, unspecified: Secondary | ICD-10-CM | POA: Diagnosis not present

## 2020-02-01 DIAGNOSIS — G8929 Other chronic pain: Secondary | ICD-10-CM | POA: Diagnosis not present

## 2020-02-01 DIAGNOSIS — E039 Hypothyroidism, unspecified: Secondary | ICD-10-CM | POA: Diagnosis not present

## 2020-02-01 DIAGNOSIS — Z008 Encounter for other general examination: Secondary | ICD-10-CM | POA: Diagnosis not present

## 2020-02-01 DIAGNOSIS — K469 Unspecified abdominal hernia without obstruction or gangrene: Secondary | ICD-10-CM | POA: Diagnosis not present

## 2020-02-01 DIAGNOSIS — R37 Sexual dysfunction, unspecified: Secondary | ICD-10-CM | POA: Diagnosis not present

## 2020-02-01 DIAGNOSIS — N529 Male erectile dysfunction, unspecified: Secondary | ICD-10-CM | POA: Diagnosis not present

## 2020-02-01 DIAGNOSIS — E669 Obesity, unspecified: Secondary | ICD-10-CM | POA: Diagnosis not present

## 2020-02-01 DIAGNOSIS — I10 Essential (primary) hypertension: Secondary | ICD-10-CM | POA: Diagnosis not present

## 2020-02-01 DIAGNOSIS — K219 Gastro-esophageal reflux disease without esophagitis: Secondary | ICD-10-CM | POA: Diagnosis not present

## 2020-02-10 ENCOUNTER — Other Ambulatory Visit: Payer: Self-pay | Admitting: Internal Medicine

## 2020-02-13 ENCOUNTER — Other Ambulatory Visit: Payer: Self-pay | Admitting: Internal Medicine

## 2020-04-09 ENCOUNTER — Other Ambulatory Visit: Payer: Self-pay | Admitting: Internal Medicine

## 2020-05-14 ENCOUNTER — Other Ambulatory Visit: Payer: Self-pay | Admitting: Internal Medicine

## 2020-05-21 ENCOUNTER — Other Ambulatory Visit: Payer: Self-pay | Admitting: Internal Medicine

## 2020-05-21 NOTE — Telephone Encounter (Signed)
Check [place] registry last filled 04/10/2020.Marland KitchenJohny Chess

## 2020-06-14 ENCOUNTER — Telehealth: Payer: Self-pay | Admitting: Internal Medicine

## 2020-06-14 MED ORDER — TESTOSTERONE 20.25 MG/ACT (1.62%) TD GEL: TRANSDERMAL | 0 refills | Status: DC

## 2020-06-14 NOTE — Addendum Note (Signed)
Addended by: [name] on: 06/14/2020 08:46 AM   Modules accepted: Orders

## 2020-06-14 NOTE — Telephone Encounter (Signed)
    1.Medication Requested:Testosterone 20.25 MG/ACT (1.62%) GEL  2. Pharmacy (Name, [place]):[id number] - [place] - [address]  3. On Med List: yes  4. Last Visit with PCP: 01/09/20  5. Next visit date with PCP: n/a   Agent: Please be advised that RX refills may take up to 3 business days. We ask that you follow-up with your pharmacy.

## 2020-08-12 ENCOUNTER — Other Ambulatory Visit: Payer: Self-pay | Admitting: Internal Medicine

## 2020-08-29 ENCOUNTER — Other Ambulatory Visit: Payer: Self-pay | Admitting: Internal Medicine

## 2020-09-09 ENCOUNTER — Other Ambulatory Visit: Payer: Self-pay | Admitting: Internal Medicine

## 2020-10-03 ENCOUNTER — Other Ambulatory Visit: Payer: Self-pay | Admitting: Internal Medicine

## 2020-11-01 ENCOUNTER — Other Ambulatory Visit: Payer: Self-pay | Admitting: Internal Medicine

## 2020-11-10 ENCOUNTER — Other Ambulatory Visit: Payer: Self-pay | Admitting: Internal Medicine

## 2020-11-12 ENCOUNTER — Other Ambulatory Visit: Payer: Self-pay

## 2020-11-12 MED ORDER — LEVOTHYROXINE SODIUM 175 MCG PO TABS: ORAL_TABLET | ORAL | 0 refills | Status: DC

## 2020-12-25 ENCOUNTER — Telehealth: Payer: Self-pay | Admitting: Internal Medicine

## 2020-12-25 NOTE — Telephone Encounter (Signed)
   Patient/ spouse calling for status of refill  Patient has no medication remaining Appointment scheduled for 3/30

## 2020-12-25 NOTE — Telephone Encounter (Signed)
Faxed in today for patient 

## 2020-12-26 ENCOUNTER — Other Ambulatory Visit: Payer: Self-pay | Admitting: Internal Medicine

## 2020-12-26 ENCOUNTER — Other Ambulatory Visit: Payer: Self-pay

## 2020-12-26 MED ORDER — TESTOSTERONE 20.25 MG/ACT (1.62%) TD GEL: TRANSDERMAL | 0 refills | Status: DC

## 2020-12-27 ENCOUNTER — Other Ambulatory Visit: Payer: Self-pay | Admitting: Internal Medicine
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
## 2021-01-15 NOTE — Patient Instructions (Addendum)
Blood work was ordered.     Medications changes include :  none     Please followup in 6 months    Health Maintenance, Male Adopting a healthy lifestyle and getting preventive care are important in promoting health and wellness. Ask your health care provider about:  The right schedule for you to have regular tests and exams.  Things you can do on your own to prevent diseases and keep yourself healthy. What should I know about diet, weight, and exercise? Eat a healthy diet  Eat a diet that includes plenty of vegetables, fruits, low-fat dairy products, and lean protein.  Do not eat a lot of foods that are high in solid fats, added sugars, or sodium.   Maintain a healthy weight Body mass index (BMI) is a measurement that can be used to identify possible weight problems. It estimates body fat based on height and weight. Your health care provider can help determine your BMI and help you achieve or maintain a healthy weight. Get regular exercise Get regular exercise. This is one of the most important things you can do for your health. Most adults should:  Exercise for at least 150 minutes each week. The exercise should increase your heart rate and make you sweat (moderate-intensity exercise).  Do strengthening exercises at least twice a week. This is in addition to the moderate-intensity exercise.  Spend less time sitting. Even light physical activity can be beneficial. Watch cholesterol and blood lipids Have your blood tested for lipids and cholesterol at 69 years of age, then have this test every 5 years. You may need to have your cholesterol levels checked more often if:  Your lipid or cholesterol levels are high.  You are older than 69 years of age.  You are at high risk for heart disease. What should I know about cancer screening? Many types of cancers can be detected early and may often be prevented. Depending on your health history and family history, you may need to  have cancer screening at various ages. This may include screening for:  Colorectal cancer.  Prostate cancer.  Skin cancer.  Lung cancer. What should I know about heart disease, diabetes, and high blood pressure? Blood pressure and heart disease  High blood pressure causes heart disease and increases the risk of stroke. This is more likely to develop in people who have high blood pressure readings, are of African descent, or are overweight.  Talk with your health care provider about your target blood pressure readings.  Have your blood pressure checked: ? Every 3-5 years if you are 53-20 years of age. ? Every year if you are 53 years old or older.  If you are between the ages of 17 and 58 and are a current or former smoker, ask your health care provider if you should have a one-time screening for abdominal aortic aneurysm (AAA). Diabetes Have regular diabetes screenings. This checks your fasting blood sugar level. Have the screening done:  Once every three years after age 27 if you are at a normal weight and have a low risk for diabetes.  More often and at a younger age if you are overweight or have a high risk for diabetes. What should I know about preventing infection? Hepatitis B If you have a higher risk for hepatitis B, you should be screened for this virus. Talk with your health care provider to find out if you are at risk for hepatitis B infection. Hepatitis C Blood testing is recommended for:  Everyone born from [birth year] through 1964.  Anyone with known risk factors for hepatitis C. Sexually transmitted infections (STIs)  You should be screened each year for STIs, including gonorrhea and chlamydia, if: ? You are sexually active and are younger than 69 years of age. ? You are older than 69 years of age and your health care provider tells you that you are at risk for this type of infection. ? Your sexual activity has changed since you were last screened, and you are at  increased risk for chlamydia or gonorrhea. Ask your health care provider if you are at risk.  Ask your health care provider about whether you are at high risk for HIV. Your health care provider may recommend a prescription medicine to help prevent HIV infection. If you choose to take medicine to prevent HIV, you should first get tested for HIV. You should then be tested every 3 months for as long as you are taking the medicine. Follow these instructions at home: Lifestyle  Do not use any products that contain nicotine or tobacco, such as cigarettes, e-cigarettes, and chewing tobacco. If you need help quitting, ask your health care provider.  Do not use street drugs.  Do not share needles.  Ask your health care provider for help if you need support or information about quitting drugs. Alcohol use  Do not drink alcohol if your health care provider tells you not to drink.  If you drink alcohol: ? Limit how much you have to 0-2 drinks a day. ? Be aware of how much alcohol is in your drink. In the [place] one drink equals one 12 oz bottle of beer (355 mL), one 5 oz glass of wine (148 mL), or one 1 oz glass of hard liquor (44 mL). General instructions  Schedule regular health, dental, and eye exams.  Stay current with your vaccines.  Tell your health care provider if: ? You often feel depressed. ? You have ever been abused or do not feel safe at home. Summary  Adopting a healthy lifestyle and getting preventive care are important in promoting health and wellness.  Follow your health care provider's instructions about healthy diet, exercising, and getting tested or screened for diseases.  Follow your health care provider's instructions on monitoring your cholesterol and blood pressure. This information is not intended to replace advice given to you by your health care provider. Make sure you discuss any questions you have with your health care provider. Document Revised: 09/29/2018  Document Reviewed: 09/29/2018 Elsevier Patient Education  2021 [employer].

## 2021-01-16 ENCOUNTER — Other Ambulatory Visit: Payer: Self-pay

## 2021-01-16 ENCOUNTER — Ambulatory Visit (INDEPENDENT_AMBULATORY_CARE_PROVIDER_SITE_OTHER): Payer: Medicare HMO | Admitting: Internal Medicine

## 2021-01-16 ENCOUNTER — Encounter: Payer: Self-pay | Admitting: Internal Medicine

## 2021-01-16 VITALS — BP 122/80 | HR 52 | Temp 98.4°F | Ht 71.0 in | Wt 272.0 lb

## 2021-01-16 DIAGNOSIS — E038 Other specified hypothyroidism: Secondary | ICD-10-CM

## 2021-01-16 DIAGNOSIS — I10 Essential (primary) hypertension: Secondary | ICD-10-CM

## 2021-01-16 DIAGNOSIS — E349 Endocrine disorder, unspecified: Secondary | ICD-10-CM | POA: Diagnosis not present

## 2021-01-16 DIAGNOSIS — K429 Umbilical hernia without obstruction or gangrene: Secondary | ICD-10-CM

## 2021-01-16 DIAGNOSIS — E782 Mixed hyperlipidemia: Secondary | ICD-10-CM | POA: Diagnosis not present

## 2021-01-16 DIAGNOSIS — K439 Ventral hernia without obstruction or gangrene: Secondary | ICD-10-CM | POA: Diagnosis not present

## 2021-01-16 DIAGNOSIS — Z125 Encounter for screening for malignant neoplasm of prostate: Secondary | ICD-10-CM | POA: Diagnosis not present

## 2021-01-16 DIAGNOSIS — R7303 Prediabetes: Secondary | ICD-10-CM

## 2021-01-16 DIAGNOSIS — Z0000 Encounter for general adult medical examination without abnormal findings: Secondary | ICD-10-CM | POA: Diagnosis not present

## 2021-01-16 LAB — CBC WITH DIFFERENTIAL/PLATELET
Basophils Absolute: 0.1 10*3/uL (ref 0.0–0.1)
Basophils Relative: 0.9 % (ref 0.0–3.0)
Eosinophils Absolute: 0.3 10*3/uL (ref 0.0–0.7)
Eosinophils Relative: 4.5 % (ref 0.0–5.0)
HCT: 40.4 % (ref 39.0–52.0)
Hemoglobin: 14.4 g/dL (ref 13.0–17.0)
Lymphocytes Relative: 30.8 % (ref 12.0–46.0)
Lymphs Abs: 2.3 10*3/uL (ref 0.7–4.0)
MCHC: 35.6 g/dL (ref 30.0–36.0)
MCV: 92.9 fl (ref 78.0–100.0)
Monocytes Absolute: 0.4 10*3/uL (ref 0.1–1.0)
Monocytes Relative: 6.1 % (ref 3.0–12.0)
Neutro Abs: 4.2 10*3/uL (ref 1.4–7.7)
Neutrophils Relative %: 57.7 % (ref 43.0–77.0)
Platelets: 231 10*3/uL (ref 150.0–400.0)
RBC: 4.35 Mil/uL (ref 4.22–5.81)
RDW: 13.4 % (ref 11.5–15.5)
WBC: 7.3 10*3/uL (ref 4.0–10.5)

## 2021-01-16 LAB — COMPREHENSIVE METABOLIC PANEL
ALT: 27 U/L (ref 0–53)
AST: 21 U/L (ref 0–37)
Albumin: 4.2 g/dL (ref 3.5–5.2)
Alkaline Phosphatase: 55 U/L (ref 39–117)
BUN: 15 mg/dL (ref 6–23)
CO2: 23 mEq/L (ref 19–32)
Calcium: 9.2 mg/dL (ref 8.4–10.5)
Chloride: 104 mEq/L (ref 96–112)
Creatinine, Ser: 1.26 mg/dL (ref 0.40–1.50)
GFR: 58.65 mL/min — ABNORMAL LOW (ref >60.00)
Glucose, Bld: 92 mg/dL (ref 70–99)
Potassium: 4.3 mEq/L (ref 3.5–5.1)
Sodium: 136 mEq/L (ref 135–145)
Total Bilirubin: 0.5 mg/dL (ref 0.2–1.2)
Total Protein: 7 g/dL (ref 6.0–8.3)

## 2021-01-16 LAB — LIPID PANEL
Cholesterol: 192 mg/dL (ref 0–200)
HDL: 35.2 mg/dL — ABNORMAL LOW (ref >39.00)
LDL Cholesterol: 127 mg/dL — ABNORMAL HIGH (ref 0–99)
NonHDL: 157.17
Total CHOL/HDL Ratio: 5
Triglycerides: 152 mg/dL — ABNORMAL HIGH (ref 0.0–149.0)
VLDL: 30.4 mg/dL (ref 0.0–40.0)

## 2021-01-16 LAB — PSA, MEDICARE: PSA: 0.37 ng/ml (ref 0.10–4.00)

## 2021-01-16 LAB — HEMOGLOBIN A1C: Hgb A1c MFr Bld: 5.7 % (ref 4.6–6.5)

## 2021-01-16 LAB — TSH: TSH: 2.06 u[IU]/mL (ref 0.35–4.50)

## 2021-01-16 NOTE — Assessment & Plan Note (Signed)
Chronic Asymptomatic Monitor only

## 2021-01-16 NOTE — Assessment & Plan Note (Signed)
Chronic BP well controlled Continue lisinopril 20 mg daily, metoprolol 25 mg bid cmp

## 2021-01-16 NOTE — Assessment & Plan Note (Addendum)
Chronic Check lipid panel  Stopped crestor - muscle aches Regular exercise and healthy diet encouraged

## 2021-01-16 NOTE — Assessment & Plan Note (Signed)
Chronic Taking testosterone - continue at current dose 4 pumps daily Cbc, psa, testosterone

## 2021-01-16 NOTE — Assessment & Plan Note (Signed)
Chronic  Clinically euthyroid Currently taking levothyroxine 175 mcg daily Check tsh  Titrate med dose if needed 

## 2021-01-16 NOTE — Assessment & Plan Note (Signed)
Chronic Check a1c Low sugar / carb diet Stressed regular exercise  

## 2021-01-19 LAB — TESTOSTERONE, FREE & TOTAL
Free Testosterone: 85.4 pg/mL (ref 35.0–155.0)
Testosterone, Total, LC-MS-MS: 580 ng/dL (ref 250–1100)
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
## 2021-01-22 ENCOUNTER — Encounter: Payer: Self-pay | Admitting: Family Medicine

## 2021-01-22 ENCOUNTER — Ambulatory Visit (INDEPENDENT_AMBULATORY_CARE_PROVIDER_SITE_OTHER): Payer: Medicare HMO

## 2021-01-22 ENCOUNTER — Ambulatory Visit: Payer: Medicare HMO | Admitting: Family Medicine

## 2021-01-22 ENCOUNTER — Ambulatory Visit: Payer: Self-pay

## 2021-01-22 ENCOUNTER — Other Ambulatory Visit: Payer: Self-pay

## 2021-01-22 VITALS — BP 110/84 | HR 66 | Ht 71.0 in | Wt 267.0 lb

## 2021-01-22 DIAGNOSIS — M7062 Trochanteric bursitis, left hip: Secondary | ICD-10-CM | POA: Diagnosis not present

## 2021-01-22 DIAGNOSIS — M25512 Pain in left shoulder: Secondary | ICD-10-CM

## 2021-01-22 DIAGNOSIS — M25552 Pain in left hip: Secondary | ICD-10-CM

## 2021-01-22 DIAGNOSIS — M7061 Trochanteric bursitis, right hip: Secondary | ICD-10-CM | POA: Insufficient documentation

## 2021-01-22 DIAGNOSIS — M19011 Primary osteoarthritis, right shoulder: Secondary | ICD-10-CM | POA: Diagnosis not present

## 2021-01-22 DIAGNOSIS — M19012 Primary osteoarthritis, left shoulder: Secondary | ICD-10-CM

## 2021-01-22 DIAGNOSIS — G8929 Other chronic pain: Secondary | ICD-10-CM

## 2021-01-22 DIAGNOSIS — M25511 Pain in right shoulder: Secondary | ICD-10-CM | POA: Diagnosis not present

## 2021-01-22 DIAGNOSIS — R102 Pelvic and perineal pain: Secondary | ICD-10-CM | POA: Diagnosis not present

## 2021-01-22 IMAGING — DX DG PELVIS 1-2V
2 series · 2 of 2 positions shown · non-contrast
Comparison: CT pelvis [DATE]

CLINICAL DATA: Pain

EXAM:
PELVIS - 1-2 VIEW

[pelvis ap (1 of 2)]
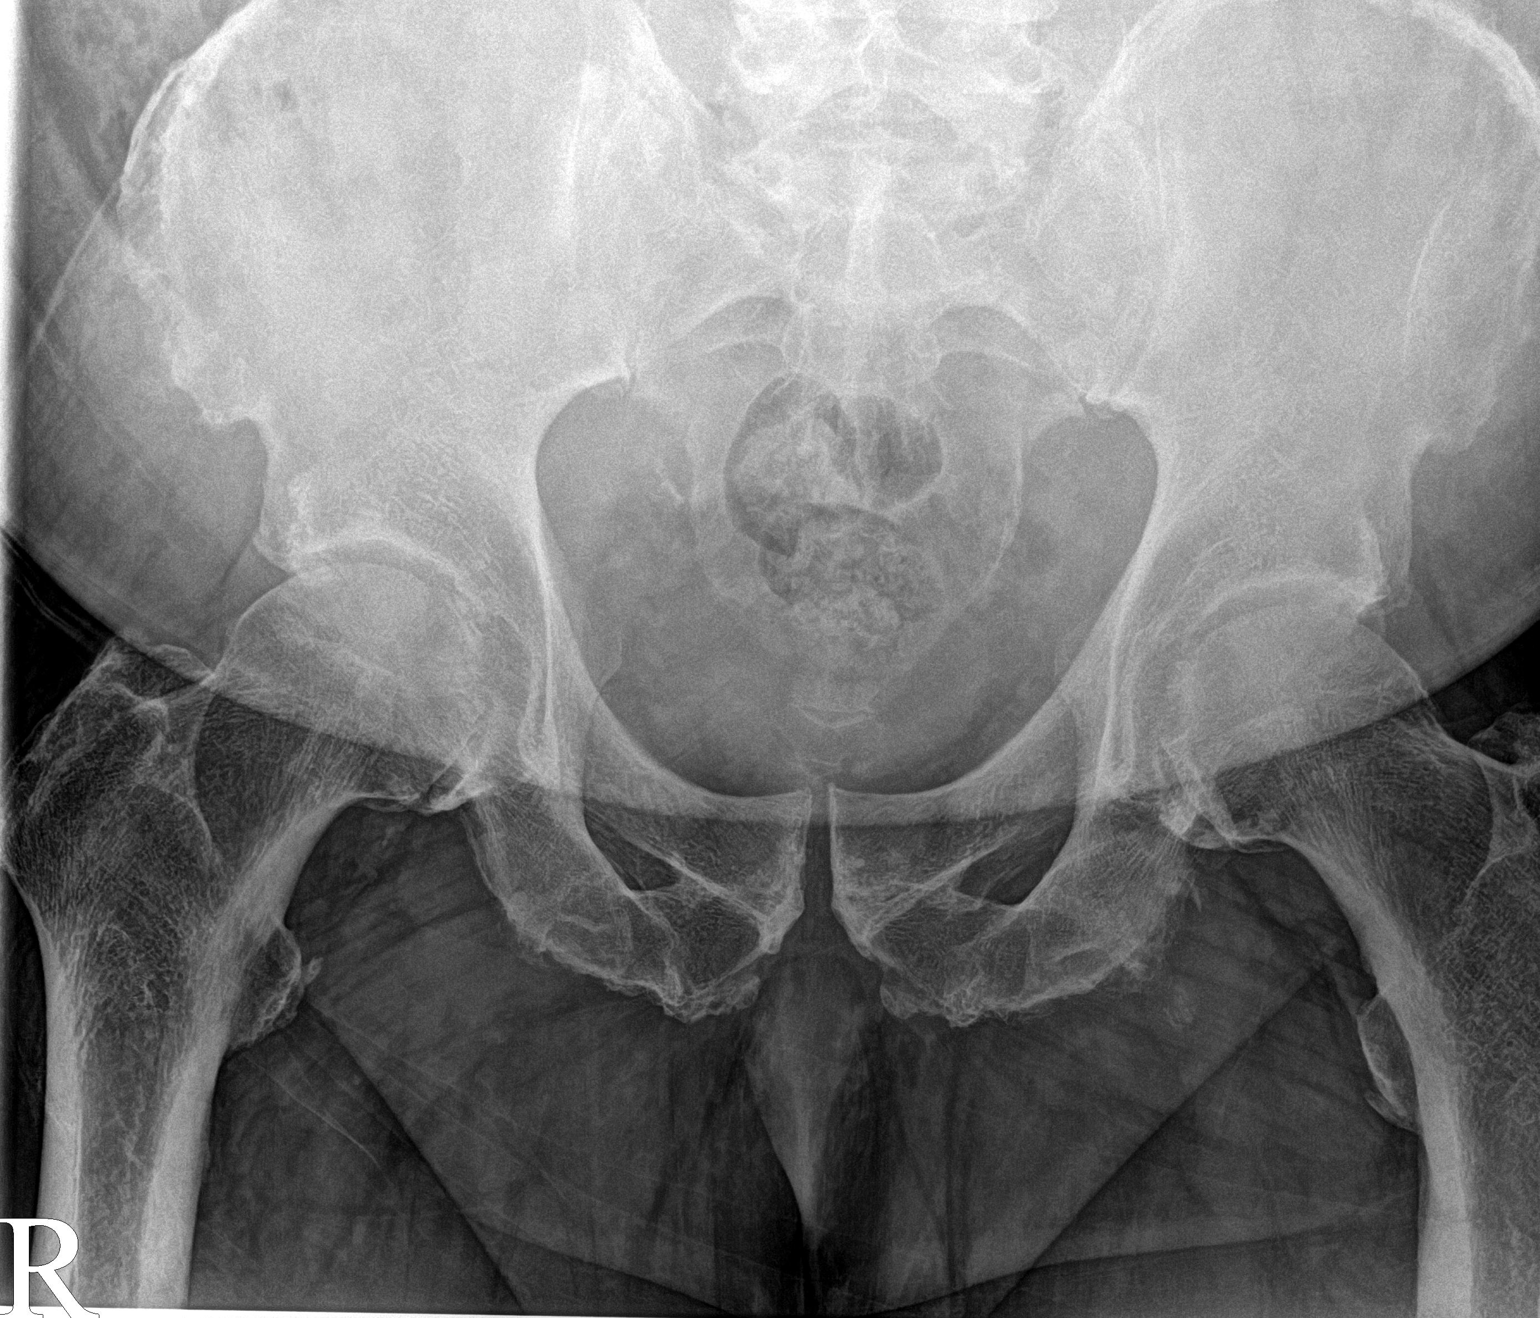

[pelvis ap (2 of 2)]
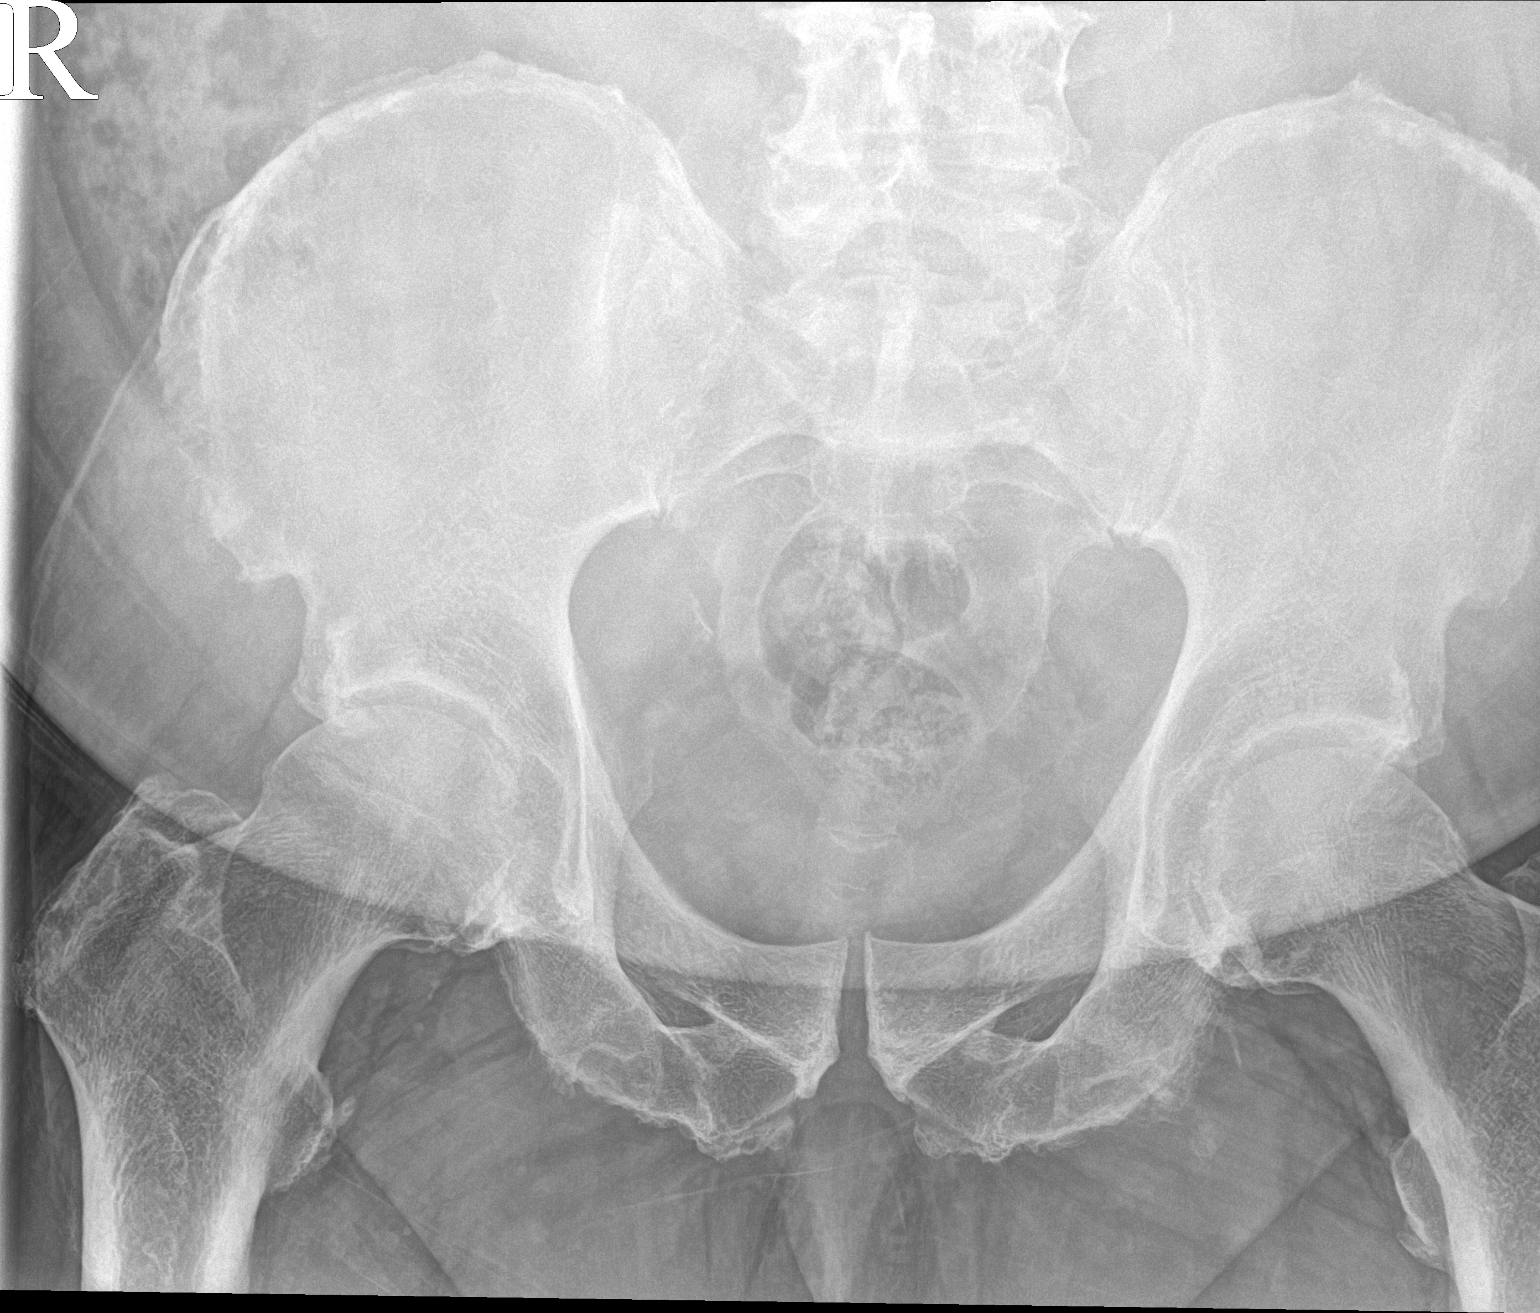

[2 of 2 positions shown; findings below may reference images not displayed]

FINDINGS: Frontal view obtained. Note that a portion of the lateral aspect of
the proximal left femur is not visualized. In visualized regions, no
acute fracture or dislocation. There is slight symmetric narrowing
of each hip joint. No erosive changes. Areas of enthesopathic
calcification noted along each medial ischium and along each iliac
crest.
IMPRESSION: Note that a portion of the proximal left femur laterally not
visualized. No fracture or dislocation. Mild symmetric narrowing
each hip joint. Areas of enthesopathic calcification as noted,
benign.

## 2021-01-22 NOTE — Patient Instructions (Addendum)
Good to see you GT exercises Pennsaid 2 times a day Pelvis xray today Ice 20 mins 2 times a day See me again in 6 weeks

## 2021-01-22 NOTE — Assessment & Plan Note (Signed)
Bilateral injections given again today.  Left side does have a cyst noted.  If continuing to have difficulty I would consider the possibility of advanced imaging.  Patient now has had no other findings such as fevers, chills, abnormal weight loss.  No significant skin findings noted.  Patient knows that if any redness occurs that to seek medical attention or call us and we will send in antibiotics.  Patient will follow up with me again in 6 weeks.

## 2021-01-22 NOTE — Assessment & Plan Note (Signed)
Patient given injection and tolerated the procedure well.  Discussed icing regimen and home exercise, discussed topical anti-inflammatories and given trial of as well as discussed the over-the-counter medications.  Patient will try more core strengthening and stability exercises.  Follow-up with me again in 6 weeks

## 2021-02-02 ENCOUNTER — Telehealth: Payer: Self-pay | Admitting: Internal Medicine

## 2021-02-05 ENCOUNTER — Ambulatory Visit: Payer: Medicare HMO | Admitting: Family Medicine

## 2021-02-11 MED ORDER — TESTOSTERONE 20.25 MG/ACT (1.62%) TD GEL: TRANSDERMAL | 0 refills | Status: DC

## 2021-02-11 NOTE — Telephone Encounter (Signed)
Patient calling states the pharmacy has not received the refill

## 2021-02-11 NOTE — Telephone Encounter (Signed)
resent

## 2021-02-11 NOTE — Addendum Note (Signed)
Addended by: [name] on: 02/11/2021 11:32 AM   Modules accepted: Orders

## 2021-02-22 ENCOUNTER — Telehealth: Payer: Self-pay

## 2021-02-22 NOTE — Chronic Care Management (AMB) (Signed)
Attempted to call patient to establish with CCM care, no answer, left message for patient to return call if interested.  Tomasa Blase, PharmD Clinical Pharmacist, [place]

## 2021-03-08 ENCOUNTER — Telehealth: Payer: Self-pay | Admitting: Internal Medicine

## 2021-03-08 NOTE — Progress Notes (Signed)
  Chronic Care Management   Note  03/08/2021 Name: Charles [name] MRN: [id number] DOB: January 05, 1951  Charles [name] is a 70 y.o. year old male who is a primary care patient of Burns, Claudina Lick, MD. I reached out to Charles [name] by phone today in response to a referral sent by [name]'s PCP, [name], MD.   [name] was given information about Chronic Care Management services today including:  1. CCM service includes personalized support from designated clinical staff supervised by his physician, including individualized plan of care and coordination with other care providers 2. 24/7 contact phone numbers for assistance for urgent and routine care needs. 3. Service will only be billed when office clinical staff spend 20 minutes or more in a month to coordinate care. 4. Only one practitioner may furnish and bill the service in a calendar month. 5. The patient may stop CCM services at any time (effective at the end of the month) by phone call to the office staff.   Patient agreed to services and verbal consent obtained.   Follow up plan:  [place]
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
## 2021-03-12 ENCOUNTER — Ambulatory Visit: Payer: Self-pay

## 2021-03-12 ENCOUNTER — Ambulatory Visit: Payer: Medicare HMO | Admitting: Family Medicine

## 2021-03-12 ENCOUNTER — Encounter: Payer: Self-pay | Admitting: Family Medicine

## 2021-03-12 ENCOUNTER — Other Ambulatory Visit: Payer: Self-pay

## 2021-03-12 ENCOUNTER — Ambulatory Visit (INDEPENDENT_AMBULATORY_CARE_PROVIDER_SITE_OTHER): Payer: Medicare HMO

## 2021-03-12 VITALS — BP 108/80 | HR 69 | Ht 71.0 in | Wt 263.0 lb

## 2021-03-12 DIAGNOSIS — M25512 Pain in left shoulder: Secondary | ICD-10-CM

## 2021-03-12 DIAGNOSIS — M19011 Primary osteoarthritis, right shoulder: Secondary | ICD-10-CM | POA: Diagnosis not present

## 2021-03-12 DIAGNOSIS — G8929 Other chronic pain: Secondary | ICD-10-CM

## 2021-03-12 DIAGNOSIS — M19012 Primary osteoarthritis, left shoulder: Secondary | ICD-10-CM

## 2021-03-12 DIAGNOSIS — M25511 Pain in right shoulder: Secondary | ICD-10-CM

## 2021-03-12 IMAGING — DX DG SHOULDER 2+V*L*
3 series · 3 of 3 positions shown · non-contrast
Comparison: None.

CLINICAL DATA: Chronic bilateral shoulder pain

EXAM:
LEFT SHOULDER - 2+ VIEW

[shoulder ap (1 of 2)]
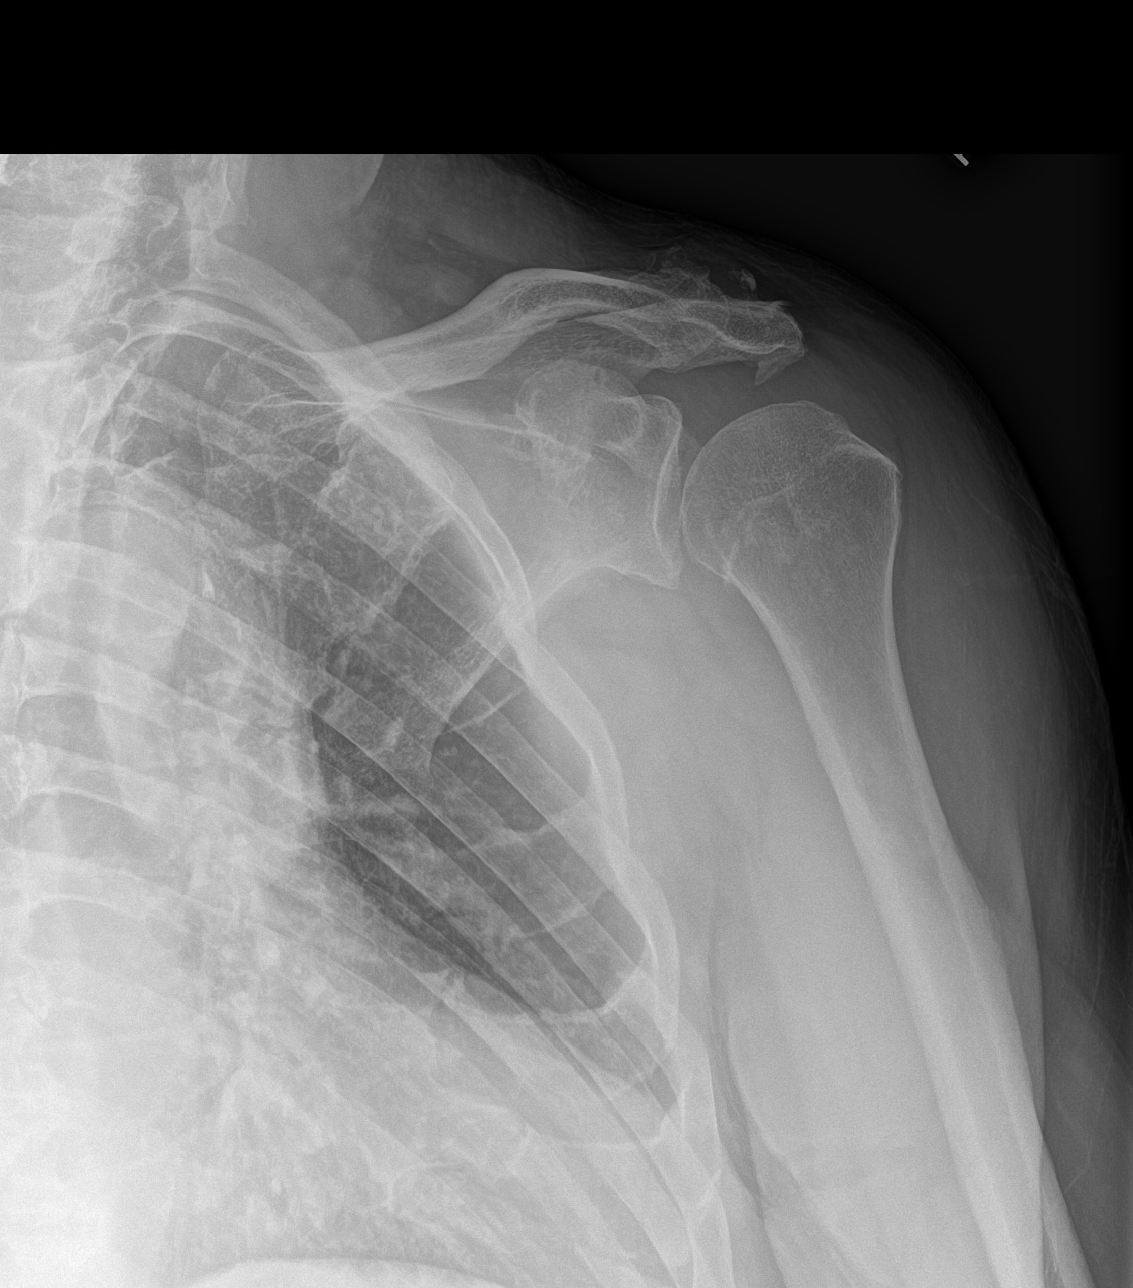

[shoulder ap (2 of 2)]
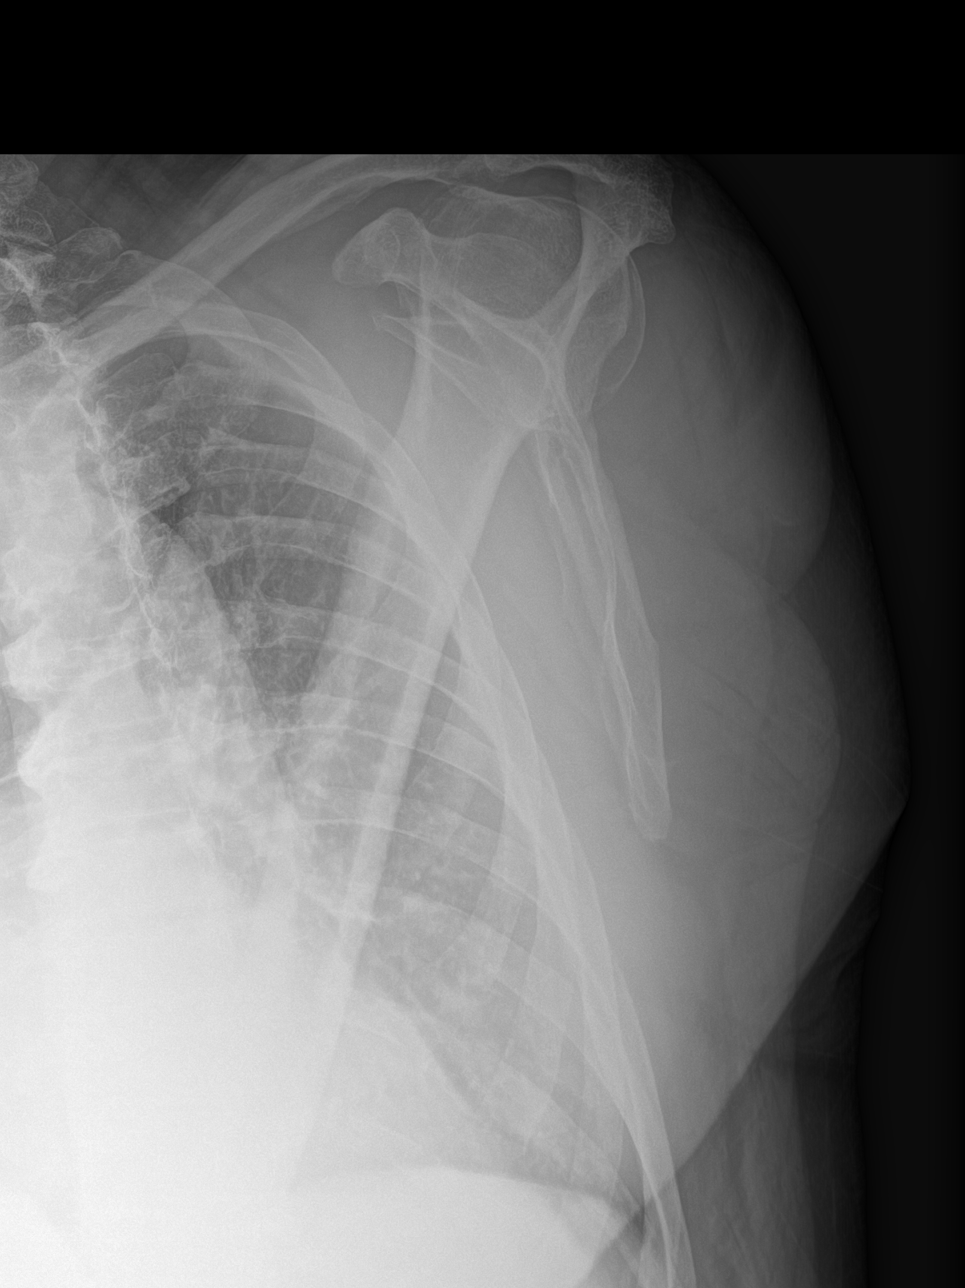

[shoulder axial]
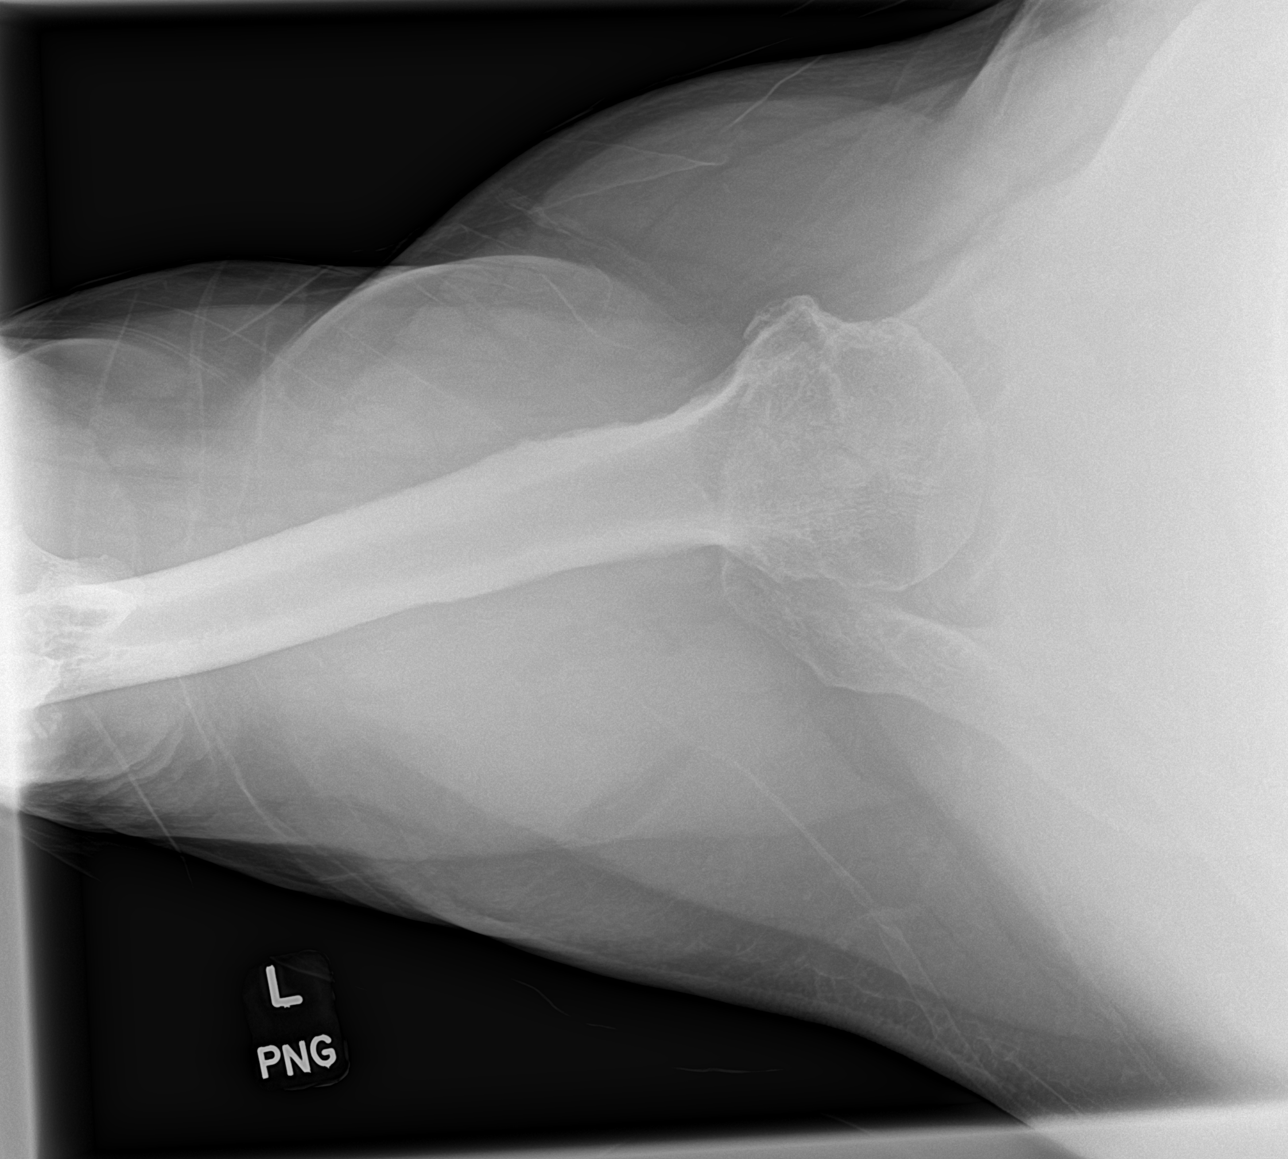

[3 of 3 positions shown; findings below may reference images not displayed]

FINDINGS: Negative for fracture or dislocation. Mild degenerative change in
the shoulder joint. Moderate degenerative changes spurring in the AC
joint.
IMPRESSION: Degenerative changes in the shoulder and AC joint. No acute
abnormality.

## 2021-03-12 IMAGING — DX DG SHOULDER 2+V*R*
3 series · 3 of 3 positions shown · non-contrast
Comparison: [DATE]

CLINICAL DATA: Chronic shoulder pain

EXAM:
RIGHT SHOULDER - 2+ VIEW

[shoulder ap (1 of 2)]
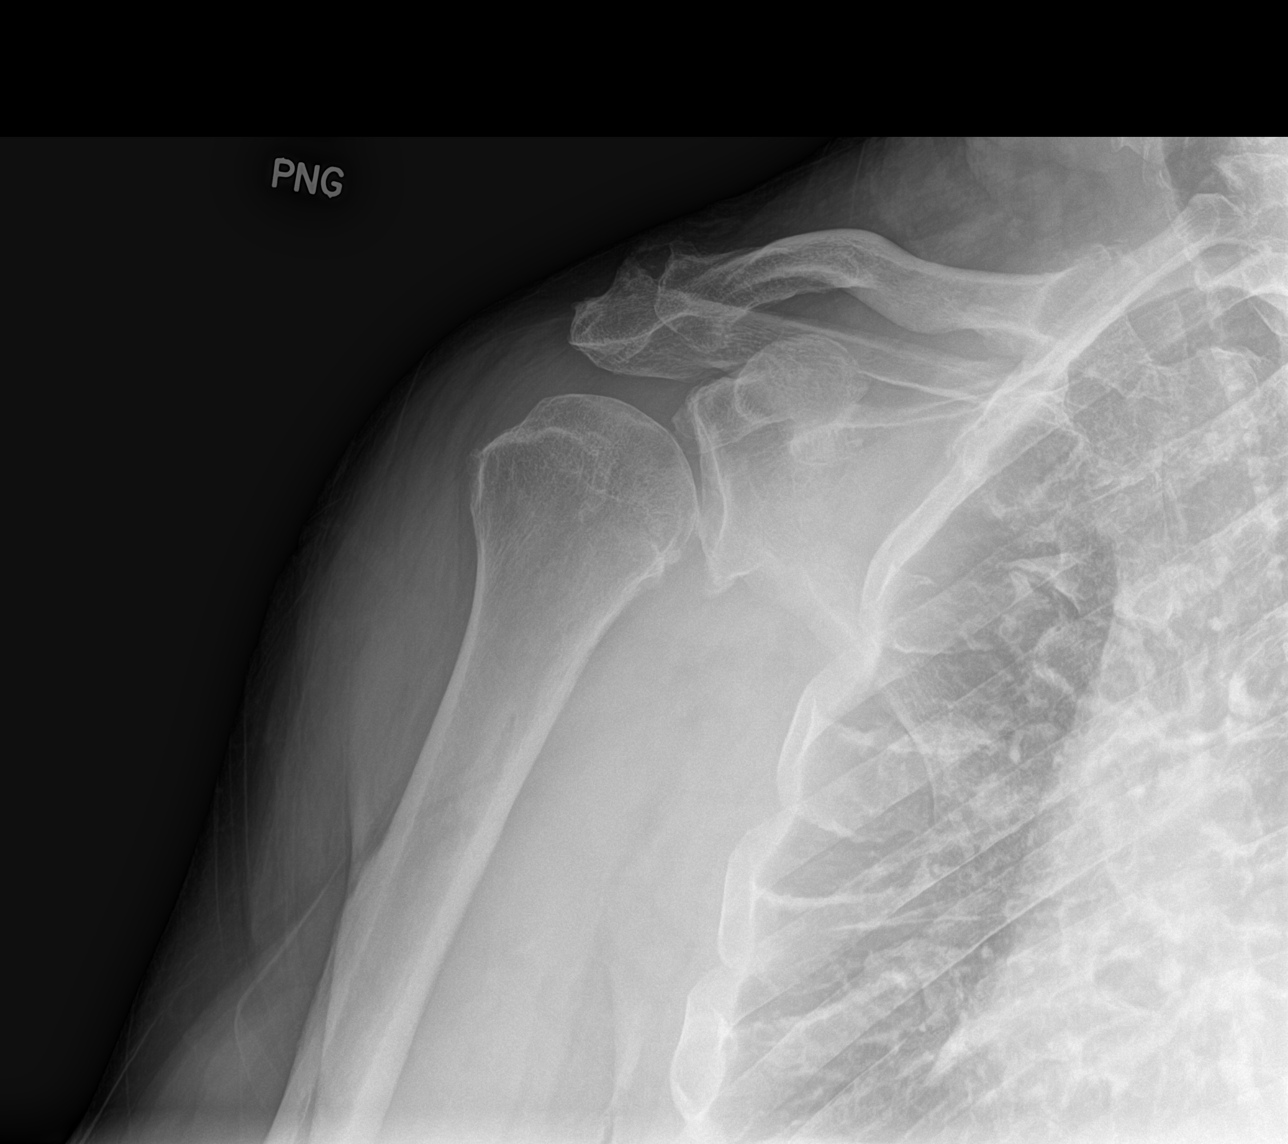

[shoulder ap (2 of 2)]
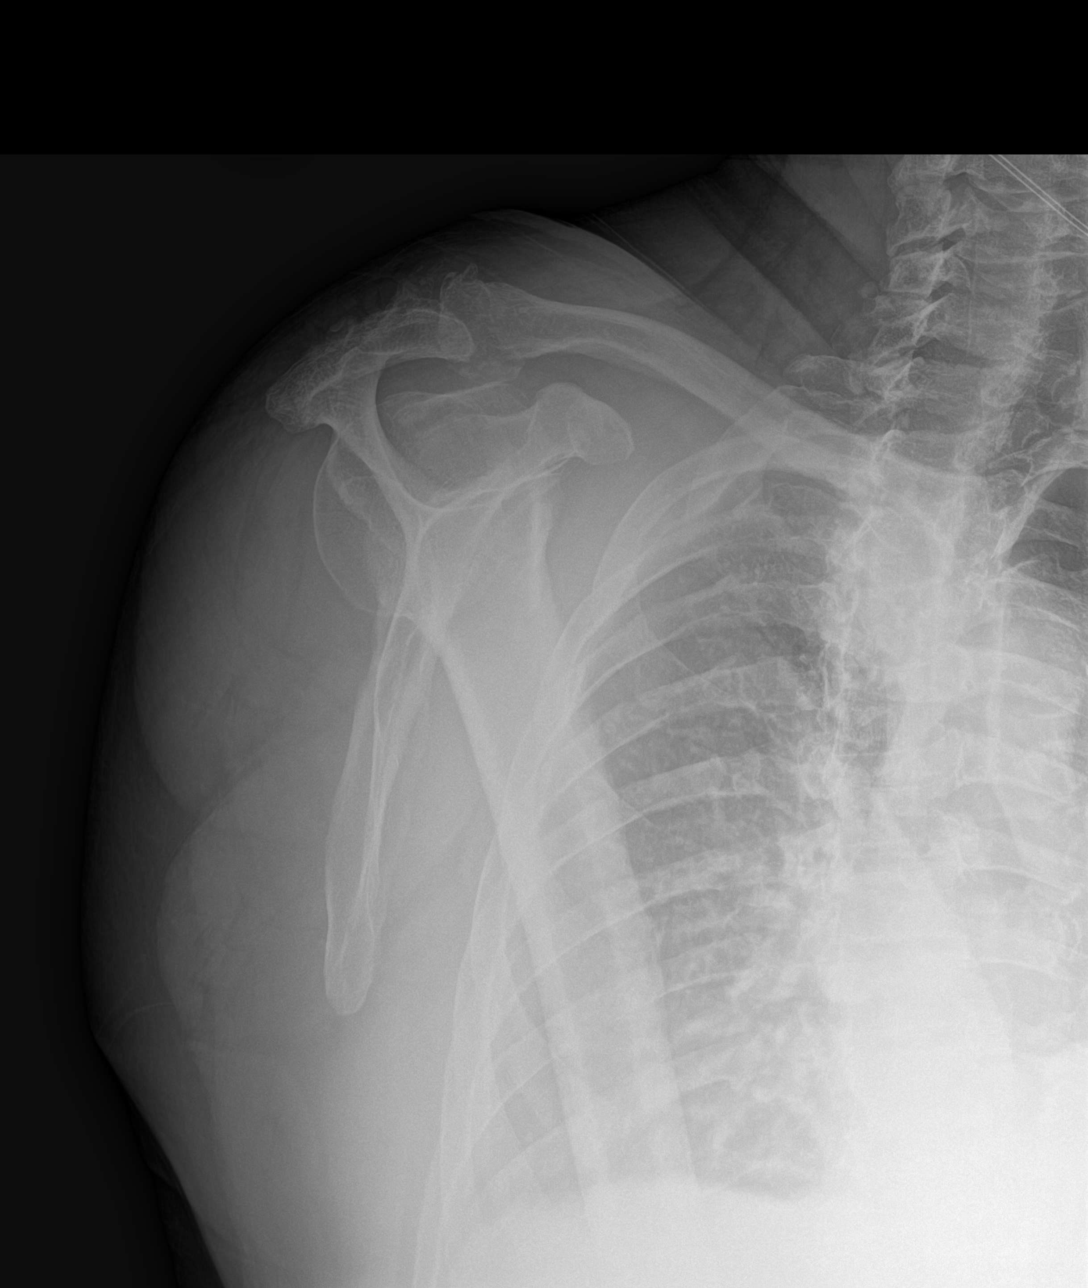

[shoulder axial]
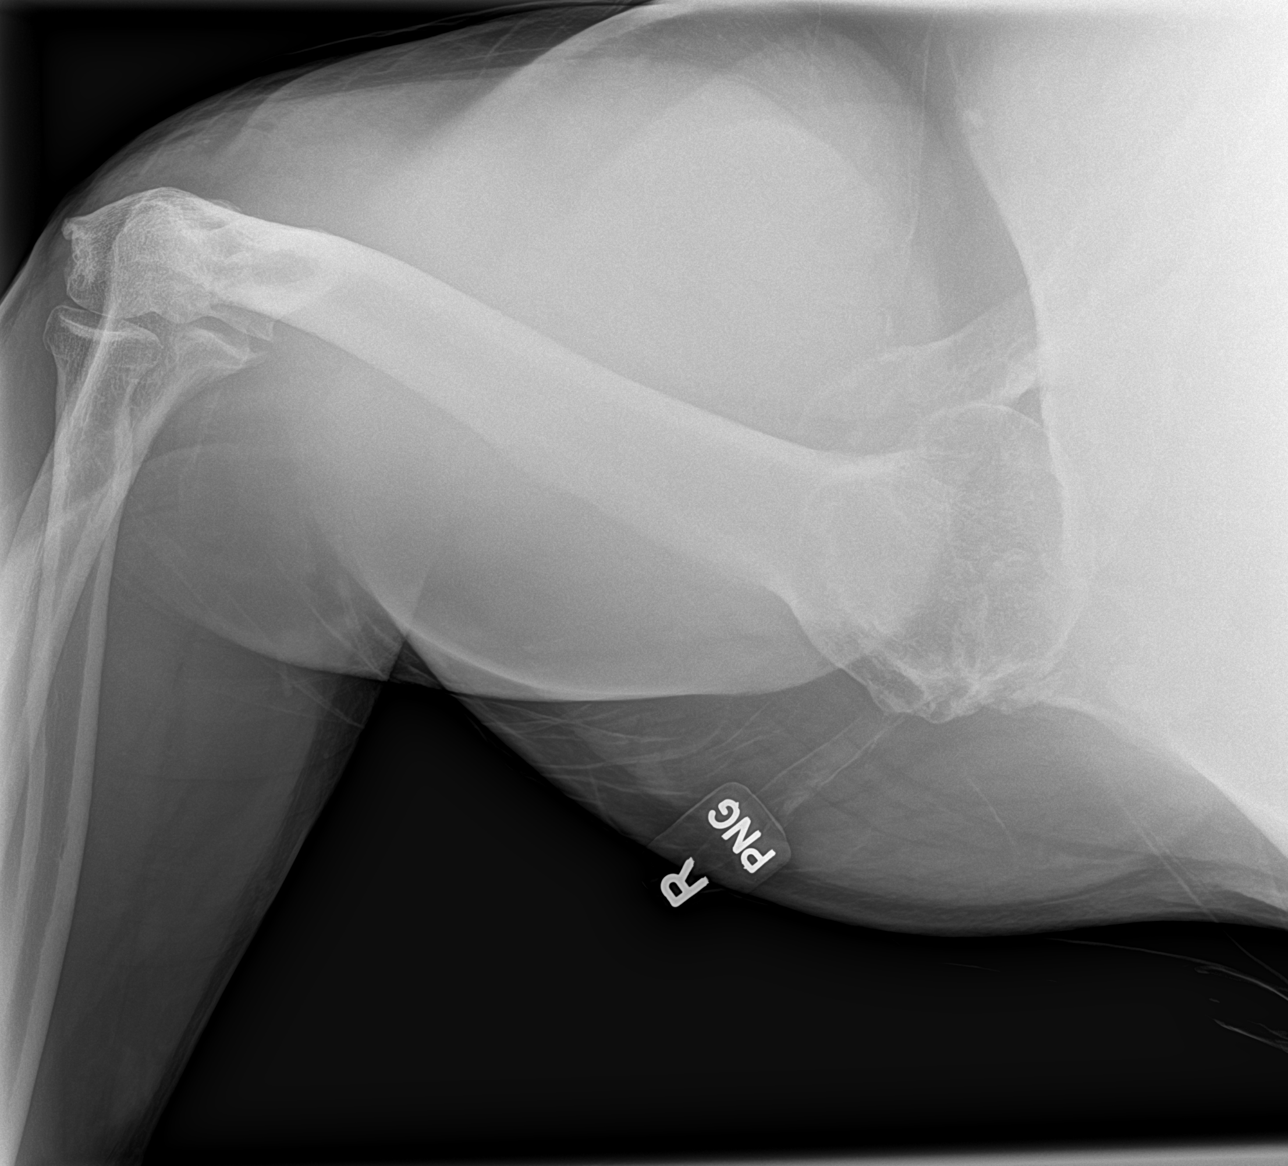

[3 of 3 positions shown; findings below may reference images not displayed]

FINDINGS: Progressive degenerative change in the shoulder and AC joint. No
fracture or dislocation.
IMPRESSION: Progressive degenerative change right shoulder and AC joint.

## 2021-03-12 NOTE — Patient Instructions (Addendum)
Good to see you xrays today Tart cherry extract 1200mg  at night Tried one more round of injections IF this does not last consider MRI Continue exercises See me in 6-8 weeks

## 2021-03-12 NOTE — Assessment & Plan Note (Signed)
Patient given injections again.  Chronic problem with exacerbation.  Concern for some underlying gout that could be contributing as well.  We discussed potential laboratory work-up which patient declined.  Patient will get x-rays we discussed the possibility of over-the-counter medications that I think will be beneficial.  Continuing the home exercises and following up again in 6 weeks.  Continue to have trouble consider advanced imaging including MRIs

## 2021-03-20 ENCOUNTER — Other Ambulatory Visit: Payer: Self-pay | Admitting: Internal Medicine

## 2021-04-03 NOTE — Progress Notes (Deleted)
Chronic Care Management Pharmacy Note  04/03/2021 Name:  Charles [name] MRN:  [id number] DOB:  [birth year]  Summary:   Recommendations/Changes made from today's visit:   Plan:   Subjective: Charles [name] is an 70 y.o. year old male who is a primary patient of Burns, Claudina Lick, MD.  The CCM team was consulted for assistance with disease management and care coordination needs.    Engaged with patient by telephone for initial visit in response to provider referral for pharmacy case management and/or care coordination services.   Consent to Services:  The patient was given the following information about Chronic Care Management services today, agreed to services, and gave verbal consent: 1. CCM service includes personalized support from designated clinical staff supervised by the primary care provider, including individualized plan of care and coordination with other care providers 2. 24/7 contact phone numbers for assistance for urgent and routine care needs. 3. Service will only be billed when office clinical staff spend 20 minutes or more in a month to coordinate care. 4. Only one practitioner may furnish and bill the service in a calendar month. 5.The patient may stop CCM services at any time (effective at the end of the month) by phone call to the office staff. 6. The patient will be responsible for cost sharing (co-pay) of up to 20% of the service fee (after annual deductible is met). Patient agreed to services and consent obtained.  Patient Care Team: [name], MD as PCP - General (Internal Medicine) Tomasa Blase, [facility] (Pharmacist) Tomasa Blase, [facility] as Pharmacist (Pharmacist)  Recent office visits: 01/16/2021 - PCP visit -annual visit- no changes to medications - patient stable at this time   Recent consult visits: 03/12/2021 - Dr. Tamala Julian - Sports medicine - injections given, concern for possible gout - patient declined lab workup, will have X-rays completed -  possible for MRI to be ordered should pain not improve 01/22/2021 - Dr. Tamala Julian - Sports Medicine - bilateral shoulder injections given - x-ray of pelvis ordered -possible advanced imaging to be completed should pain control not improve   Hospital visits: None in previous 6 months  Objective:  Lab Results  Component Value Date   CREATININE 1.26 01/16/2021   BUN 15 01/16/2021   GFR 58.65 (L) 01/16/2021   GFRNONAA >60 11/04/2017   GFRAA >60 11/04/2017   NA 136 01/16/2021   K 4.3 01/16/2021   CALCIUM 9.2 01/16/2021   CO2 23 01/16/2021   GLUCOSE 92 01/16/2021    Lab Results  Component Value Date/Time   HGBA1C 5.7 01/16/2021 10:29 AM   HGBA1C 5.6 01/09/2020 08:45 AM   GFR 58.65 (L) 01/16/2021 10:29 AM   GFR 54.51 (L) 01/09/2020 08:45 AM    Last diabetic Eye exam:  No results found for: HMDIABEYEEXA  Last diabetic Foot exam:  No results found for: HMDIABFOOTEX   Lab Results  Component Value Date   CHOL 192 01/16/2021   HDL 35.20 (L) 01/16/2021   LDLCALC 127 (H) 01/16/2021   LDLDIRECT 124.0 07/11/2019   TRIG 152.0 (H) 01/16/2021   CHOLHDL 5 01/16/2021    Hepatic Function Latest Ref Rng & Units 01/16/2021 01/09/2020 07/11/2019  Total Protein 6.0 - 8.3 g/dL 7.0 7.3 7.0  Albumin 3.5 - 5.2 g/dL 4.2 4.4 4.4  AST 0 - 37 U/L 21 28 20   ALT 0 - 53 U/L 27 31 24   Alk Phosphatase 39 - 117 U/L 55 54 50  Total Bilirubin 0.2 - 1.2  mg/dL 0.5 0.9 0.5  Bilirubin, Direct 0.0 - 0.3 mg/dL - - -    Lab Results  Component Value Date/Time   TSH 2.06 01/16/2021 10:29 AM   TSH 1.55 01/09/2020 08:45 AM    CBC Latest Ref Rng & Units 01/16/2021 01/09/2020 07/11/2019  WBC 4.0 - 10.5 K/uL 7.3 6.6 7.6  Hemoglobin 13.0 - 17.0 g/dL 14.4 15.4 15.4  Hematocrit 39.0 - 52.0 % 40.4 44.2 44.7  Platelets 150.0 - 400.0 K/uL 231.0 227.0 250.0    No results found for: VD25OH  Clinical ASCVD: No  The 10-year ASCVD risk score Mikey Bussing DC Jr., et al., 2013) is: 16%   Values used to calculate the score:     Age:  70 years     Sex: Male     Is Non-Hispanic African American: No     Diabetic: No     Tobacco smoker: No     Systolic Blood Pressure: 423 mmHg     Is BP treated: Yes     HDL Cholesterol: 35.2 mg/dL     Total Cholesterol: 192 mg/dL    Depression screen [facility] 2/9 01/16/2021 07/11/2019 07/08/2018  Decreased Interest 0 0 0  Down, Depressed, Hopeless 0 0 0  PHQ - 2 Score 0 0 0  Altered sleeping 1 - 0  Tired, decreased energy 0 - 0  Change in appetite 0 - 0  Feeling bad or failure about yourself  0 - 0  Trouble concentrating 0 - 0  Moving slowly or fidgety/restless 0 - 0  Suicidal thoughts 0 - -  PHQ-9 Score 1 - 0  Difficult doing work/chores Not difficult at all - -    Social History   Tobacco Use  Smoking Status Former   Pack years: 0.00   Types: Cigarettes   Quit date: 10/20/1982   Years since quitting: 38.4  Smokeless Tobacco Never  Tobacco Comments   Quit at age 28   BP Readings from Last 3 Encounters:  03/12/21 108/80  01/22/21 110/84  01/16/21 122/80   Pulse Readings from Last 3 Encounters:  03/12/21 69  01/22/21 66  01/16/21 (!) 52   Wt Readings from Last 3 Encounters:  03/12/21 263 lb (119.3 kg)  01/22/21 267 lb (121.1 kg)  01/16/21 272 lb (123.4 kg)   BMI Readings from Last 3 Encounters:  03/12/21 36.68 kg/m  01/22/21 37.24 kg/m  01/16/21 37.94 kg/m    Assessment/Interventions: Review of patient past medical history, allergies, medications, health status, including review of consultants reports, laboratory and other test data, was performed as part of comprehensive evaluation and provision of chronic care management services.   SDOH:  (Social Determinants of Health) assessments and interventions performed: {yes/no:20286}  SDOH Screenings   Alcohol Screen: Not on file  Depression (PHQ2-9): Low Risk    PHQ-2 Score: 1  Financial Resource Strain: Not on file  Food Insecurity: Not on file  Housing: Not on file  Physical Activity: Not on file  Social  Connections: Not on file  Stress: Not on file  Tobacco Use: Medium Risk   Smoking Tobacco Use: Former   Smokeless Tobacco Use: Never  Transportation Needs: Not on file    [place]  Allergies  Allergen Reactions   Lipitor [Atorvastatin Calcium] Other (See Comments)    Leg aches    Medications Reviewed Today     Reviewed by Charles Pulley, DO (Physician) on 03/12/21 at 1218  Med List Status: <None>   Medication Order Taking? Sig  Documenting Provider Last Dose Status Informant  aspirin EC 81 MG tablet [id number] Yes Take 81 mg by mouth daily. [provider] Taking Active Self  levothyroxine (SYNTHROID) 175 MCG tablet [id number] Yes TAKE 1 TABLET BY MOUTH EVERY DAY BEFORE BREAKFAST Burns, Claudina Lick, MD Taking Active   lisinopril (ZESTRIL) 20 MG tablet [id number] Yes TAKE 1 TABLET BY MOUTH EVERY DAY Burns, Claudina Lick, MD Taking Active   metoprolol tartrate (LOPRESSOR) 25 MG tablet [id number] Yes TAKE 1 TABLET BY MOUTH TWICE A DAY Burns, Claudina Lick, MD Taking Active   omeprazole (PRILOSEC) 20 MG capsule [id number] Yes Take 20 mg by mouth daily. [provider] Taking Active Self  tadalafil (CIALIS) 20 MG tablet [id number] Yes TAKE ONE HALF OF TABLET TO ONE TABLET EVERY 3 DAYS AS NEEDED.  Patient taking differently: Take 10-20 mg by mouth daily as needed for erectile dysfunction.   [name], MD Taking Active   Testosterone 20.25 MG/ACT (1.62%) GEL [id number] Yes APPLY 4 PUMPS DAILY TO UPPER ARM OR CHEST [name], MD Taking Active             Patient Active Problem List   Diagnosis Date Noted   Greater trochanteric bursitis of left hip 01/22/2021   Ventral hernia without obstruction or gangrene 01/16/2021   Inguinal hernia 01/09/2020   AC (acromioclavicular) arthritis 08/09/2019   RLQ abdominal pain 11/05/2018   Osteoarthritis of hand 01/77/9390   Umbilical hernia without obstruction and without gangrene 07/08/2017   Obesity 04/07/2016   Prediabetes  01/11/2015   Testosterone deficiency 07/13/2008   Hyperlipidemia 07/13/2008   ERECTILE DYSFUNCTION 07/13/2008   Essential hypertension 07/13/2008   History of colonic polyps 07/13/2008   NEPHROLITHIASIS, HX OF 07/13/2008   Hypothyroidism 07/05/2007    Immunization History  Administered Date(s) Administered   Fluad Quad(high Dose 65+) 07/11/2019   Influenza Whole 07/25/2009, 07/24/2010   Influenza, High Dose Seasonal PF 07/08/2018   Influenza,inj,Quad PF,6+ Mos 10/12/2015, 07/08/2017   PFIZER(Purple Top)SARS-COV-2 Vaccination 11/14/2019, 12/05/2019, 08/07/2020   Pneumococcal Conjugate-13 01/07/2019   Pneumococcal Polysaccharide-23 01/09/2020   Tdap 02/11/2012, 01/21/2020    Conditions to be addressed/monitored:  {USCCMDZASSESSMENTOPTIONS:23563}  There are no care plans that you recently modified to display for this patient.     Medication Assistance: {MEDASSISTANCEINFO:25044}   Patient's preferred pharmacy is:  [id number] [place] [place] [address]Phone: [phone]: [phone]  Uses pill box? {Yes or If no, why not?:20788} Pt endorses ***% compliance  Care Plan and Follow Up Patient Decision:  {FOLLOWUP:24991}  Plan: {CM FOLLOW UP PDSKA:76811}   Current Barriers:  {pharmacybarriers:24917}  Pharmacist Clinical Goal(s):  Patient will {PHARMACYGOALCHOICES:24921} through collaboration with PharmD and provider.   Interventions: 1:1 collaboration with BBinnie Rail MD regarding development and update of comprehensive plan of care as evidenced by provider attestation and co-signature Inter-disciplinary care team collaboration (see longitudinal plan of care) Comprehensive medication review performed; medication list updated in electronic medical record  Hypertension (BP goal <130/80) -Controlled -Current treatment: Lisinopril 260mdaily  Metoprolol Tartrate 2535m- 1 tablet twice daily  -Medications previously tried: n/a  -Current home readings: *** -Current dietary habits: *** -Current exercise habits: *** -{ACTIONS;DENIES/REPORTS:21021675} hypotensive/hypertensive symptoms -Educated on {CCM BP Counseling:25124} -Counseled to monitor BP at home ***, document, and provide log at future appointments -{CCMPHARMDINTERVENTION:25122}  Hyperlipidemia: (LDL goal < 100) -Not ideally controlled - Last LDL -122 mg/dL (01/16/2021) -  Current treatment: ASA 79m daily  No statin currently  -Medications previously tried: Atorvastatin - stopped due to leg pains   -Current dietary patterns: *** -Current exercise habits: *** -Educated on {CCM HLD Counseling:25126} -{CCMPHARMDINTERVENTION:25122}  Prediabetes (A1c goal <6.5%) -{US controlled/uncontrolled:25276} - Last A1c 5.7% (01/16/2021) -Current meal patterns:  breakfast: ***  lunch: ***  dinner: *** snacks: *** drinks: *** -Current exercise: *** -Educated on {CCM DM COUNSELING:25123} -Counseled to check feet daily and get yearly eye exams -{CCMPHARMDINTERVENTION:25122}  GERD (Goal: Acid control / flare prevention ) -Controlled -Current treatment  Omeprazole 244mdaily  -Medications previously tried: n/a  -{CCMPHARMDINTERVENTION:25122}  Hypothyroidism (Goal: Maintenance of euthyroid levels) -Controlled - Last TSH level - 2.06 uIU/mL (01/16/2021) -Current treatment  Levothyroxine 17562mdaily  -Medications previously tried: ***  -{CCMPHARMDINTERVENTION:25122}  Testosterone Deficiency (Goal: Maintenance of normal testosterone levels) -Controlled - Last Free Testosterone Level - 580 ng/dL (01/16/2021) - Last PSA level - 0.37 ng/mL (01/16/2021) -Current treatment  Testosterone 20.25 mg/act (1.62%) gel - apply 4 pumps daily  -Medications previously tried: n/a  -{CCMPHARMDINTERVENTION:25122}  Health Maintenance -Vaccine gaps: Shingles vaccines / COVID booster (4th dose) -Current therapy:   Tadalafil 32m68m1/2-1 tablet daily as needed  -Educated on {ccm supplement counseling:25128} -{CCM Patient satisfied:25129} -{CCMPHARMDINTERVENTION:25122}   Patient Goals/Self-Care Activities Patient will:  - {pharmacypatientgoals:24919}  Follow Up Plan: {CM FOLLOW UP PLAN:22241}  OA of HAND / shoulders? Using anything over the counter?!?!?   Current Chart prep time = 22 mins

## 2021-04-04 ENCOUNTER — Telehealth: Payer: Medicare HMO

## 2021-04-16 ENCOUNTER — Other Ambulatory Visit: Payer: Self-pay | Admitting: Internal Medicine

## 2021-04-20 ENCOUNTER — Other Ambulatory Visit: Payer: Self-pay | Admitting: Internal Medicine

## 2021-04-25 ENCOUNTER — Encounter: Payer: Self-pay | Admitting: Internal Medicine
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
## 2021-05-03 ENCOUNTER — Ambulatory Visit: Payer: Medicare HMO | Admitting: Family Medicine
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
## 2021-05-07 ENCOUNTER — Encounter: Payer: Self-pay | Admitting: Family Medicine

## 2021-05-07 ENCOUNTER — Ambulatory Visit: Payer: Medicare HMO | Admitting: Family Medicine

## 2021-05-07 ENCOUNTER — Other Ambulatory Visit: Payer: Self-pay

## 2021-05-07 ENCOUNTER — Ambulatory Visit (INDEPENDENT_AMBULATORY_CARE_PROVIDER_SITE_OTHER): Payer: Medicare HMO

## 2021-05-07 ENCOUNTER — Telehealth: Payer: Self-pay | Admitting: Family Medicine

## 2021-05-07 ENCOUNTER — Other Ambulatory Visit: Payer: Self-pay | Admitting: Family Medicine

## 2021-05-07 VITALS — BP 122/92 | HR 55 | Ht 71.0 in | Wt 263.0 lb

## 2021-05-07 DIAGNOSIS — M25511 Pain in right shoulder: Secondary | ICD-10-CM

## 2021-05-07 DIAGNOSIS — M19011 Primary osteoarthritis, right shoulder: Secondary | ICD-10-CM

## 2021-05-07 DIAGNOSIS — M7062 Trochanteric bursitis, left hip: Secondary | ICD-10-CM

## 2021-05-07 DIAGNOSIS — G8929 Other chronic pain: Secondary | ICD-10-CM | POA: Diagnosis not present

## 2021-05-07 DIAGNOSIS — M25512 Pain in left shoulder: Secondary | ICD-10-CM | POA: Diagnosis not present

## 2021-05-07 DIAGNOSIS — M2550 Pain in unspecified joint: Secondary | ICD-10-CM

## 2021-05-07 DIAGNOSIS — M47816 Spondylosis without myelopathy or radiculopathy, lumbar region: Secondary | ICD-10-CM | POA: Diagnosis not present

## 2021-05-07 DIAGNOSIS — M160 Bilateral primary osteoarthritis of hip: Secondary | ICD-10-CM | POA: Diagnosis not present

## 2021-05-07 DIAGNOSIS — M19012 Primary osteoarthritis, left shoulder: Secondary | ICD-10-CM | POA: Diagnosis not present

## 2021-05-07 LAB — CBC WITH DIFFERENTIAL/PLATELET
Basophils Absolute: 0.1 10*3/uL (ref 0.0–0.1)
Basophils Relative: 0.8 % (ref 0.0–3.0)
Eosinophils Absolute: 0.2 10*3/uL (ref 0.0–0.7)
Eosinophils Relative: 2.7 % (ref 0.0–5.0)
HCT: 41.1 % (ref 39.0–52.0)
Hemoglobin: 14.6 g/dL (ref 13.0–17.0)
Lymphocytes Relative: 27.9 % (ref 12.0–46.0)
Lymphs Abs: 2.2 10*3/uL (ref 0.7–4.0)
MCHC: 35.6 g/dL (ref 30.0–36.0)
MCV: 92 fl (ref 78.0–100.0)
Monocytes Absolute: 0.5 10*3/uL (ref 0.1–1.0)
Monocytes Relative: 6.2 % (ref 3.0–12.0)
Neutro Abs: 4.8 10*3/uL (ref 1.4–7.7)
Neutrophils Relative %: 62.4 % (ref 43.0–77.0)
Platelets: 246 10*3/uL (ref 150.0–400.0)
RBC: 4.47 Mil/uL (ref 4.22–5.81)
RDW: 13.5 % (ref 11.5–15.5)
WBC: 7.8 10*3/uL (ref 4.0–10.5)

## 2021-05-07 LAB — COMPREHENSIVE METABOLIC PANEL
ALT: 30 U/L (ref 0–53)
AST: 22 U/L (ref 0–37)
Albumin: 4.2 g/dL (ref 3.5–5.2)
Alkaline Phosphatase: 54 U/L (ref 39–117)
BUN: 16 mg/dL (ref 6–23)
CO2: 25 mEq/L (ref 19–32)
Calcium: 8.9 mg/dL (ref 8.4–10.5)
Chloride: 103 mEq/L (ref 96–112)
Creatinine, Ser: 1.18 mg/dL (ref 0.40–1.50)
GFR: 63.31 mL/min (ref >60.00)
Glucose, Bld: 114 mg/dL — ABNORMAL HIGH (ref 70–99)
Potassium: 3.7 mEq/L (ref 3.5–5.1)
Sodium: 136 mEq/L (ref 135–145)
Total Bilirubin: 0.6 mg/dL (ref 0.2–1.2)
Total Protein: 7.1 g/dL (ref 6.0–8.3)

## 2021-05-07 LAB — IBC PANEL
Iron: 100 ug/dL (ref 42–165)
Saturation Ratios: 29.3 % (ref 20.0–50.0)
Transferrin: 244 mg/dL (ref 212.0–360.0)

## 2021-05-07 LAB — URIC ACID: Uric Acid, Serum: 8 mg/dL — ABNORMAL HIGH (ref 4.0–7.8)

## 2021-05-07 LAB — FERRITIN: Ferritin: 167.1 ng/mL (ref 22.0–322.0)

## 2021-05-07 LAB — VITAMIN B12: Vitamin B-12: 381 pg/mL (ref 211–911)

## 2021-05-07 LAB — PSA: PSA: 0.42 ng/mL (ref 0.10–4.00)

## 2021-05-07 LAB — VITAMIN D 25 HYDROXY (VIT D DEFICIENCY, FRACTURES): VITD: 29.11 ng/mL — ABNORMAL LOW (ref 30.00–100.00)

## 2021-05-07 LAB — C-REACTIVE PROTEIN: CRP: <1 mg/dL (ref 0.5–20.0)

## 2021-05-07 LAB — TSH: TSH: 0.94 u[IU]/mL (ref 0.35–5.50)

## 2021-05-07 LAB — SEDIMENTATION RATE: Sed Rate: 13 mm/hr (ref 0–20)

## 2021-05-07 IMAGING — DX DG LUMBAR SPINE COMPLETE 4+V
5 series · 5 of 5 positions shown · non-contrast
Comparison: None.

CLINICAL DATA: Worsening atraumatic bilateral hip pain x6 months.

EXAM:
LUMBAR SPINE - COMPLETE 4+ VIEW

[l-spine ap]
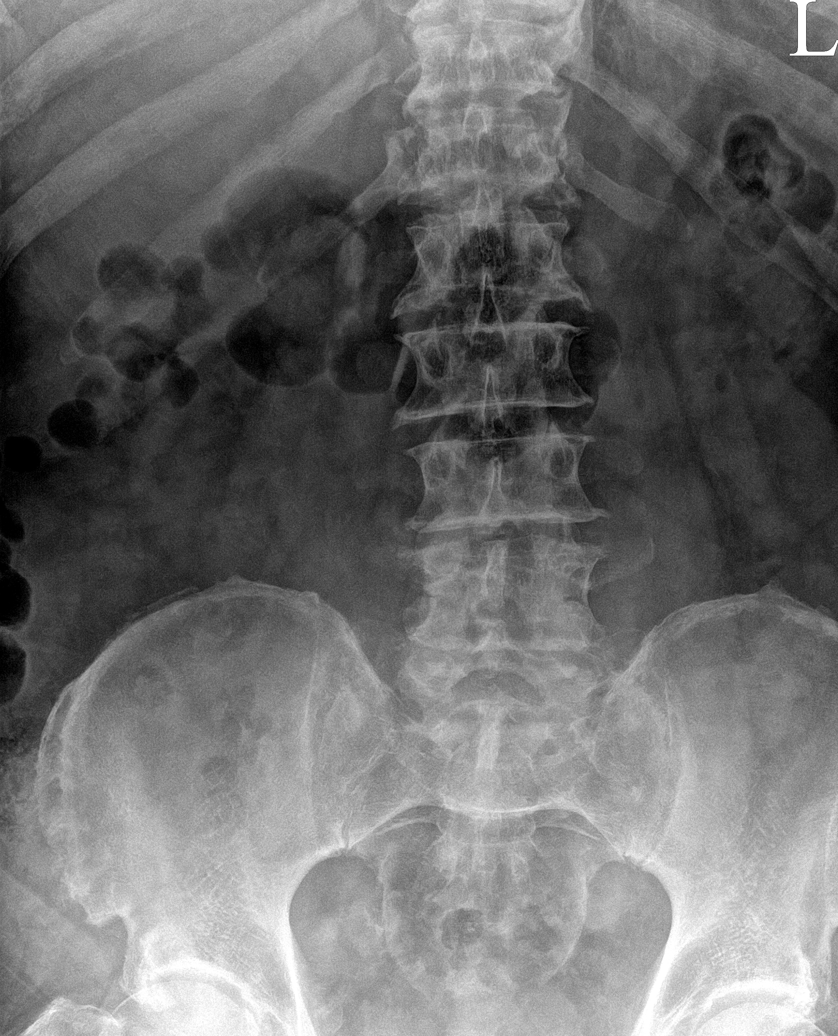

[l-spine obl (1 of 2)]
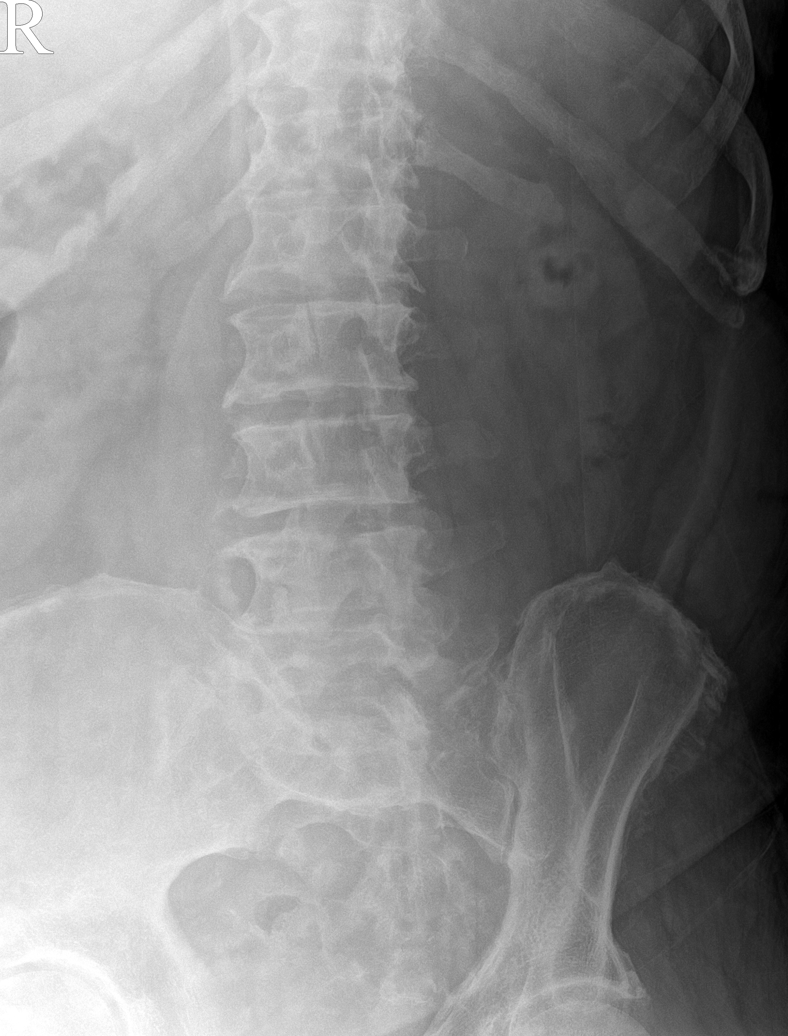

[l-spine obl (2 of 2)]
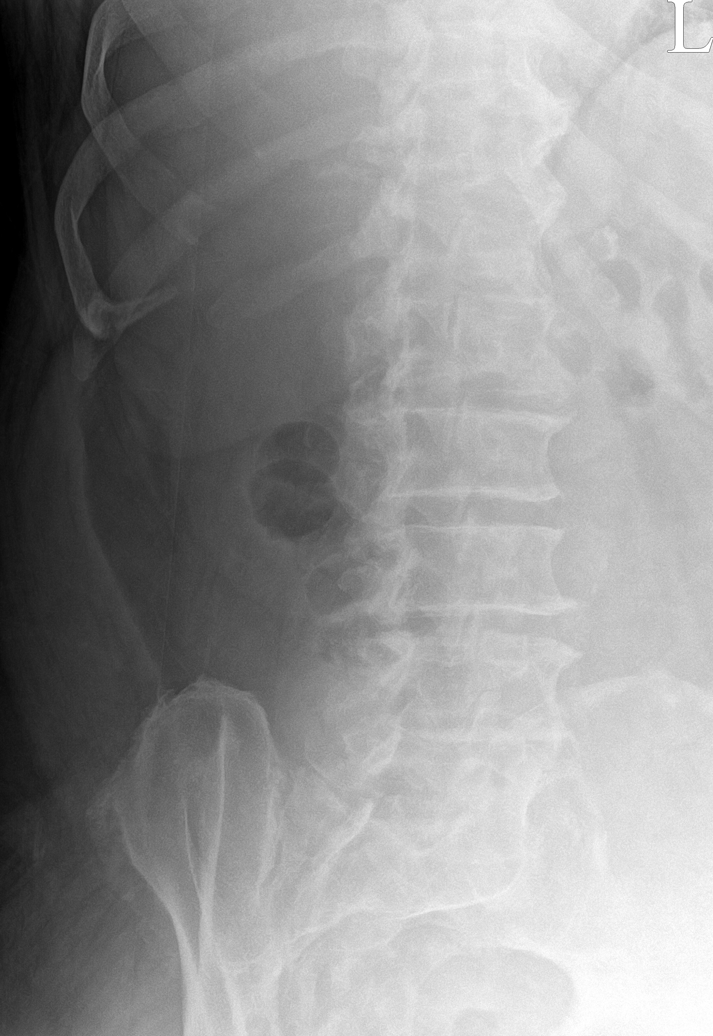

[l-spine lateral]
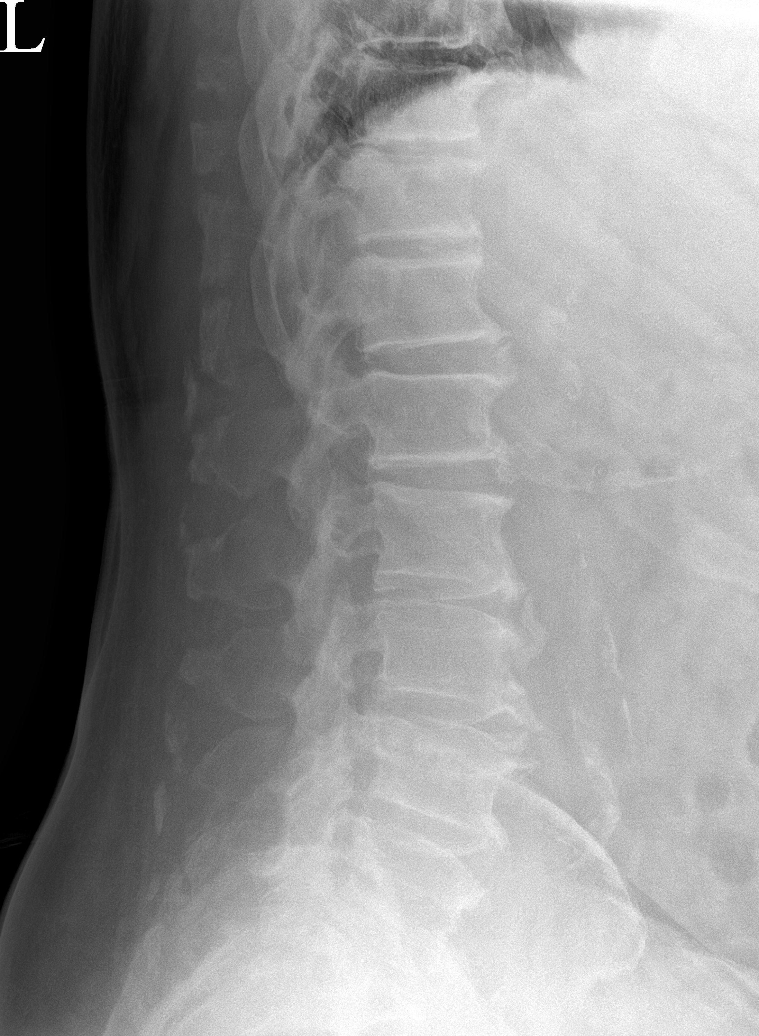

[l-spine spot]
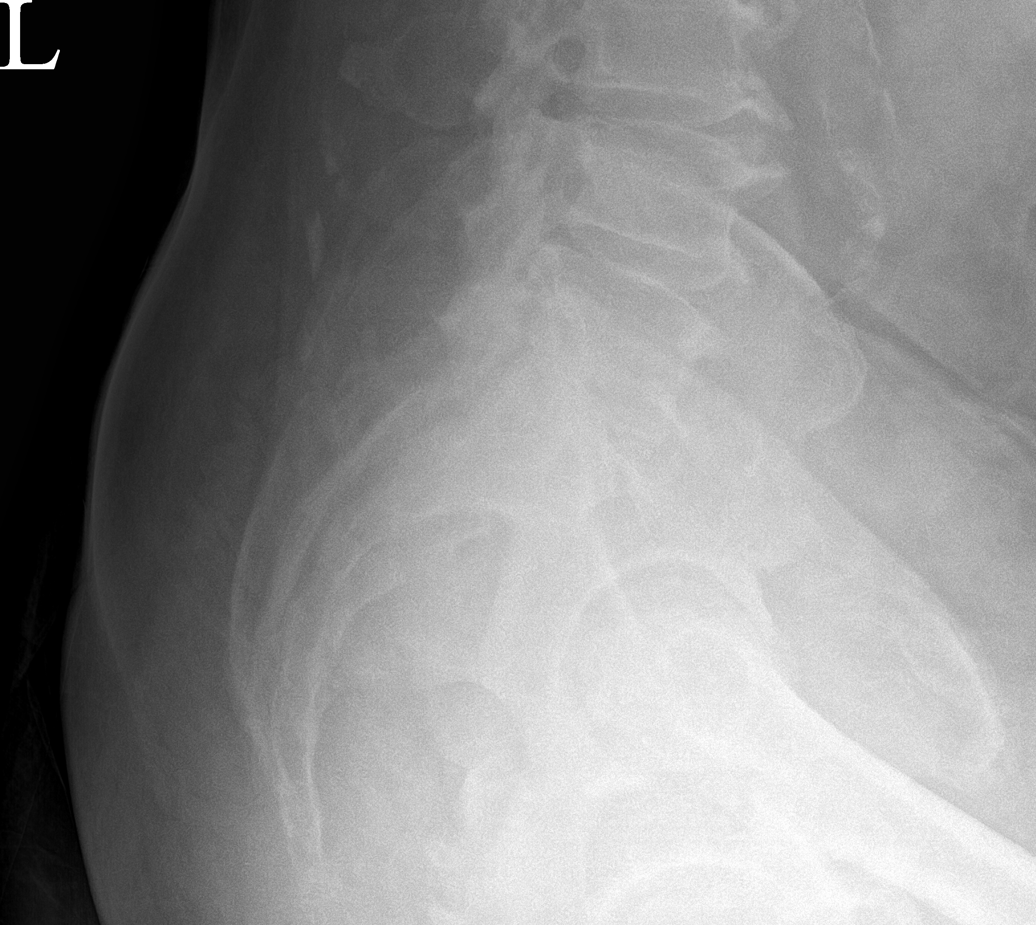

[5 of 5 positions shown; findings below may reference images not displayed]

FINDINGS: There is no evidence of lumbar spine fracture. Alignment is normal.
Marked severity endplate sclerosis and anterior osteophyte formation
is seen throughout all levels of the lumbar spine. Bilateral
multilevel facet joint hypertrophy is noted. Mild to moderate
severity intervertebral disc space narrowing is noted at the levels
of L3-L4, L4-L5 and L5-S1. There is moderate severity calcification
of the abdominal aorta.
IMPRESSION: Multilevel degenerative changes without an acute osseous
abnormality.

## 2021-05-07 IMAGING — DX DG HIP (WITH OR WITHOUT PELVIS) 5+V BILAT
5 series · 5 of 5 positions shown · non-contrast
Comparison: None.

CLINICAL DATA: Worsening atraumatic bilateral hip pain x6 months.

EXAM:
DG HIP (WITH OR WITHOUT PELVIS) 5+V BILAT

[pelvis ap]
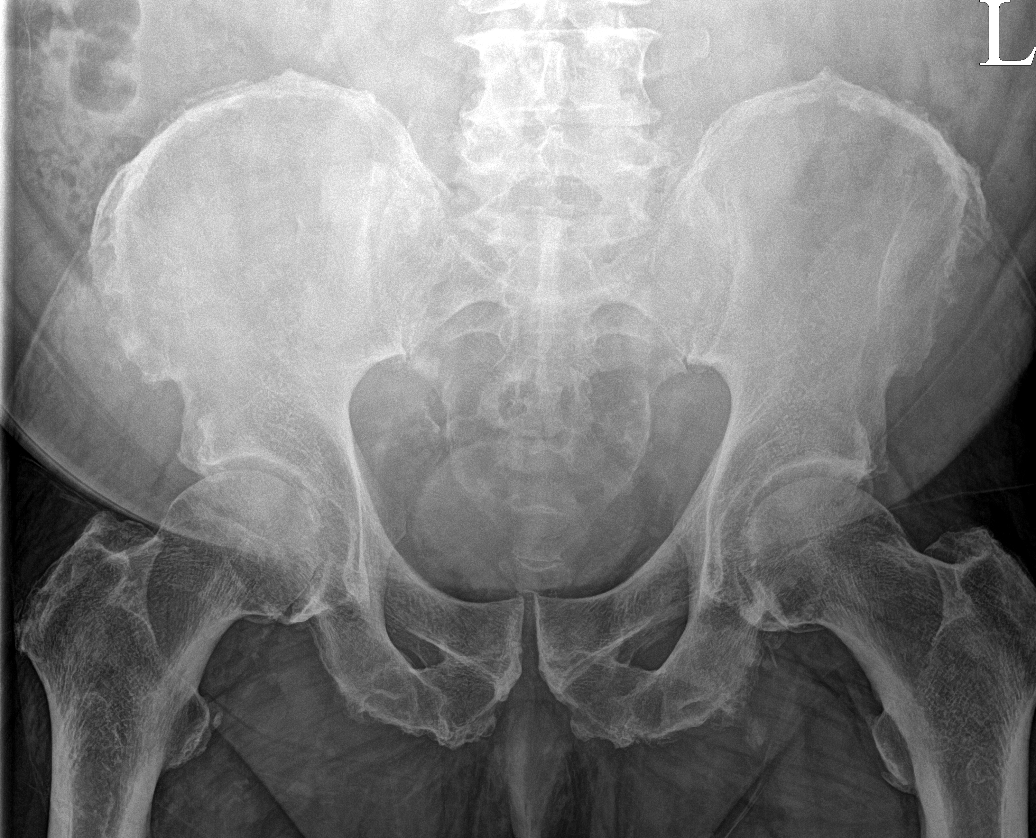

[hip ap (1 of 2)]
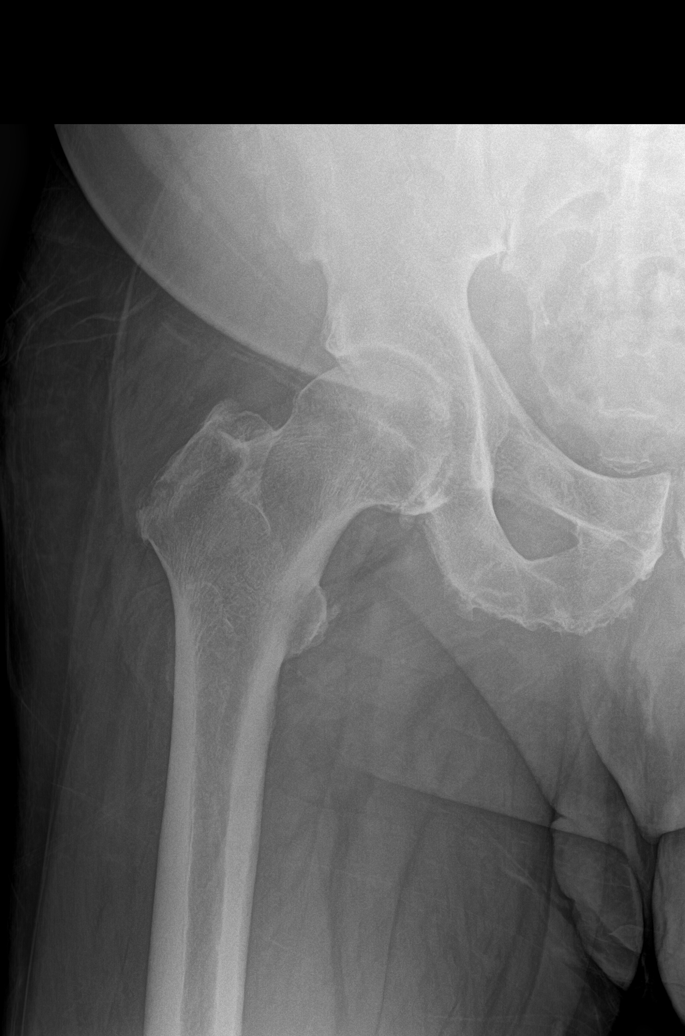

[hip ap (2 of 2)]
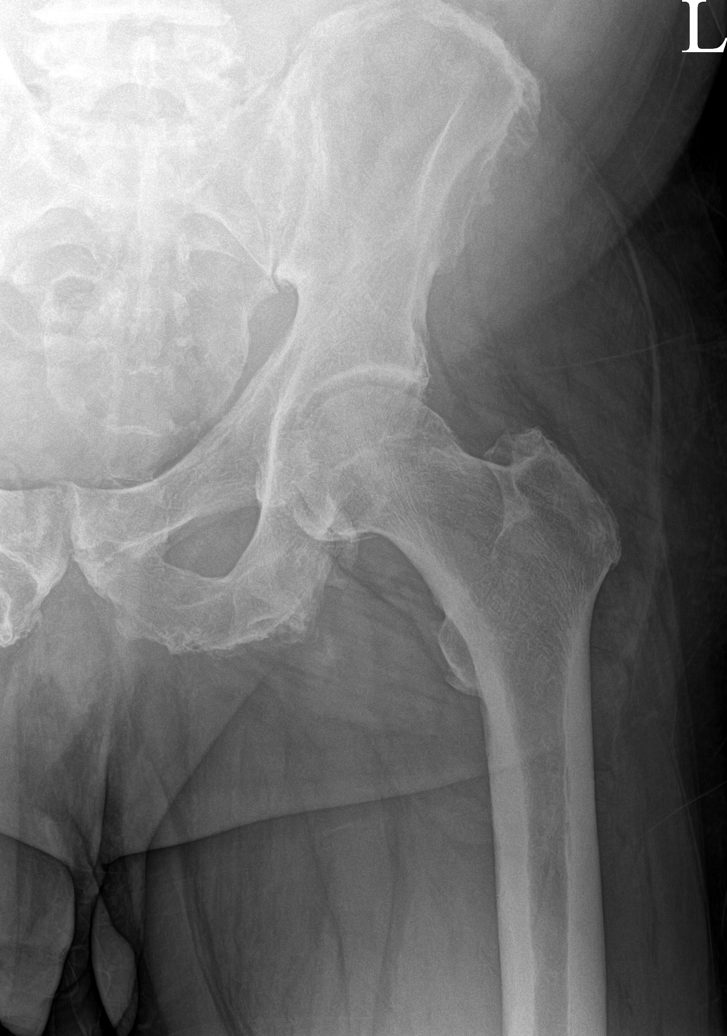

[hip frog leg (1 of 2)]
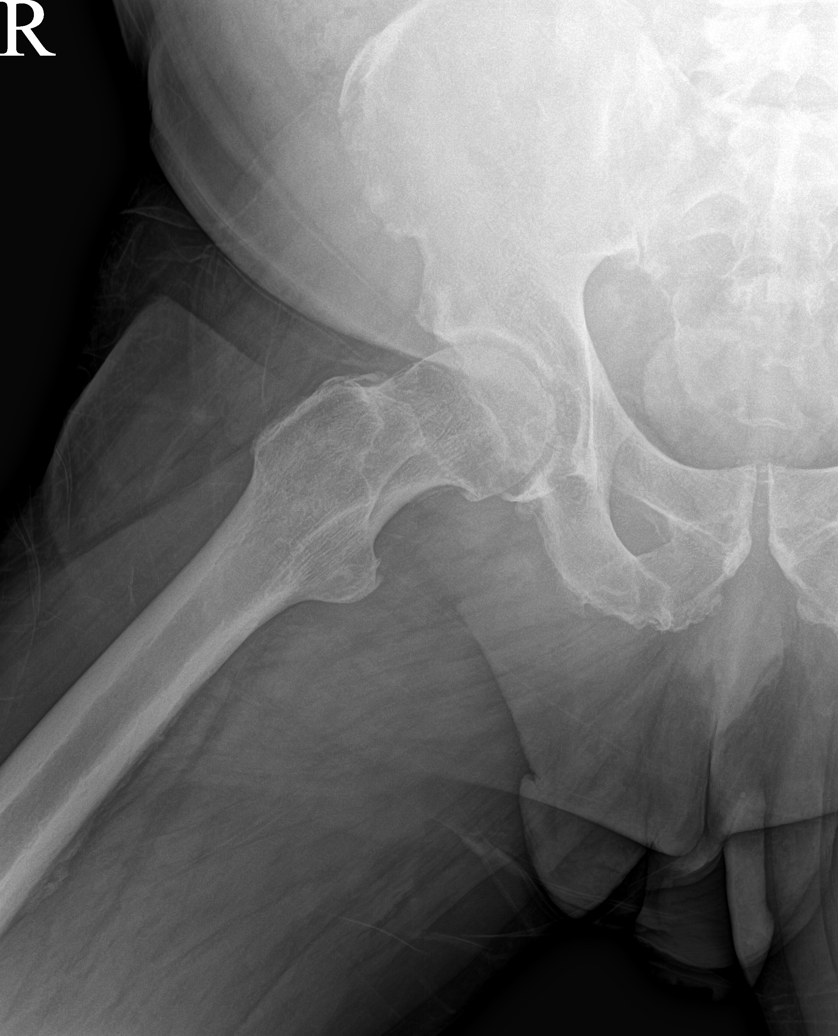

[hip frog leg (2 of 2)]
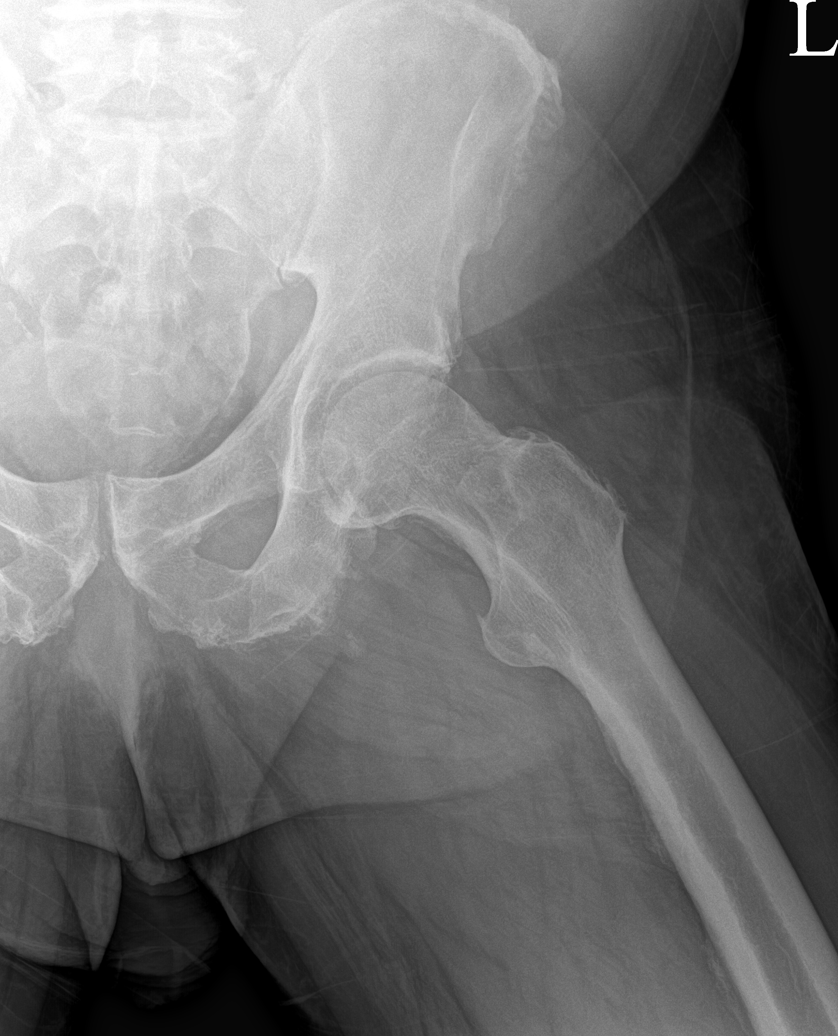

[5 of 5 positions shown; findings below may reference images not displayed]

FINDINGS: There is no evidence of hip fracture or dislocation. Mild to
moderate severity degenerative changes are noted in the form of
joint space narrowing and acetabular sclerosis. Additional
degenerative changes are seen within the visualized portion of the
lower lumbar spine.
IMPRESSION: Degenerative changes without an acute osseous abnormality.

## 2021-05-07 MED ORDER — ALLOPURINOL 100 MG PO TABS: 200.0000 mg | ORAL_TABLET | Freq: Every day | ORAL | 0 refills | Status: DC

## 2021-05-07 MED ORDER — METHYLPREDNISOLONE ACETATE 40 MG/ML IJ SUSP
40.0000 mg | Freq: Once | INTRAMUSCULAR | Status: AC
  Administered 2021-05-07: 40 mg via INTRAMUSCULAR

## 2021-05-07 MED ORDER — KETOROLAC TROMETHAMINE 30 MG/ML IJ SOLN
30.0000 mg | Freq: Once | INTRAMUSCULAR | Status: AC
  Administered 2021-05-07: 30 mg via INTRAMUSCULAR

## 2021-05-07 NOTE — Telephone Encounter (Signed)
Medication sent into pharmacy. Patient notified.

## 2021-05-07 NOTE — Assessment & Plan Note (Signed)
History of the greater trochanteric bursitis.  Patient does have any long-term improvement at this time.  Discussed with patient about icing regimen and home exercises.  Increase activity slowly.  Do feel that other laboratory work-up is necessary at this time.  Need to rule out polymyalgia rheumatica as well as gout given the possibilities.  Patient will follow up with me again in 4 to 6 weeks otherwise.

## 2021-05-07 NOTE — Assessment & Plan Note (Signed)
Patient has had 3 injections over the course of time.  If continuing to have discomfort.  Do feel MRI is necessary.  Patient is very active and would be a surgical candidate if necessary.  Depending on MRI results we will see how patient responds

## 2021-05-07 NOTE — Patient Instructions (Addendum)
Good to see you Right and left hip xray  Lumbar xray MRI of both shoulders Labs today See me again in 5-6 weeks we will talk about imaging

## 2021-05-07 NOTE — Telephone Encounter (Signed)
Pt called to respond to our MyChart message regarding uric acid labs.  Please call gout meds in to [facility] per pt request.

## 2021-05-08 LAB — ANA: Anti Nuclear Antibody (ANA): NEGATIVE

## 2021-05-08 LAB — PTH, INTACT AND CALCIUM
Calcium: 9.2 mg/dL (ref 8.6–10.3)
PTH: 31 pg/mL (ref 16–77)

## 2021-05-08 LAB — RHEUMATOID FACTOR: Rheumatoid fact SerPl-aCnc: <14 IU/mL (ref <14)

## 2021-05-08 LAB — ANGIOTENSIN CONVERTING ENZYME: Angiotensin-Converting Enzyme: <5 U/L — ABNORMAL LOW (ref 9–67)

## 2021-05-08 LAB — CYCLIC CITRUL PEPTIDE ANTIBODY, IGG: Cyclic Citrullin Peptide Ab: <16 UNITS

## 2021-05-08 LAB — CALCIUM, IONIZED: Calcium, Ion: 4.97 mg/dL (ref 4.8–5.6)

## 2021-05-25 ENCOUNTER — Other Ambulatory Visit: Payer: Medicare HMO

## 2021-05-29 ENCOUNTER — Other Ambulatory Visit: Payer: Self-pay | Admitting: Family Medicine

## 2021-06-04 ENCOUNTER — Ambulatory Visit
Admission: RE | Admit: 2021-06-04 | Discharge: 2021-06-04 | Disposition: A | Discharge status: Home / Self Care | Payer: Medicare HMO | Source: Ambulatory Visit | Attending: Family Medicine | Admitting: Family Medicine

## 2021-06-04 ENCOUNTER — Other Ambulatory Visit: Payer: Self-pay

## 2021-06-04 DIAGNOSIS — M19011 Primary osteoarthritis, right shoulder: Secondary | ICD-10-CM

## 2021-06-04 DIAGNOSIS — M19012 Primary osteoarthritis, left shoulder: Secondary | ICD-10-CM

## 2021-06-04 DIAGNOSIS — M75101 Unspecified rotator cuff tear or rupture of right shoulder, not specified as traumatic: Secondary | ICD-10-CM | POA: Diagnosis not present

## 2021-06-04 DIAGNOSIS — M25412 Effusion, left shoulder: Secondary | ICD-10-CM | POA: Diagnosis not present

## 2021-06-04 DIAGNOSIS — M7552 Bursitis of left shoulder: Secondary | ICD-10-CM | POA: Diagnosis not present

## 2021-06-04 DIAGNOSIS — R600 Localized edema: Secondary | ICD-10-CM | POA: Diagnosis not present

## 2021-06-04 DIAGNOSIS — S46012A Strain of muscle(s) and tendon(s) of the rotator cuff of left shoulder, initial encounter: Secondary | ICD-10-CM | POA: Diagnosis not present

## 2021-06-04 IMAGING — MR MR SHOULDER*R* W/O CM
5 series · 35 of 40 positions shown · non-contrast
Comparison: Right shoulder radiographs [DATE]

CLINICAL DATA: Bilateral shoulder pain for greater than 1 year

EXAM:
MRI OF THE RIGHT SHOULDER WITHOUT CONTRAST
TECHNIQUE: Multiplanar, multisequence MR imaging of the shoulder was performed.
No intravenous contrast was administered.

[Series 3: T2 fat-sat · axial · 4.0mm · 0.59mm/px · z∈[-43,+67]mm · 8 of 25 slices shown (1 of 3)]
[im 1/25]
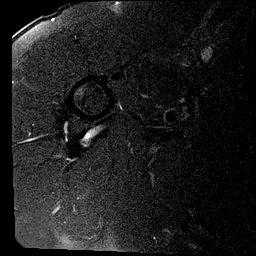
[im 4/25]
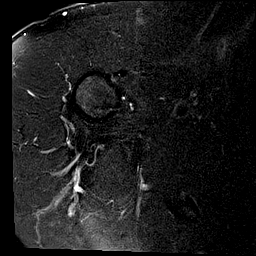
[im 7/25]
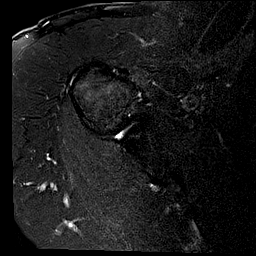
[im 11/25]
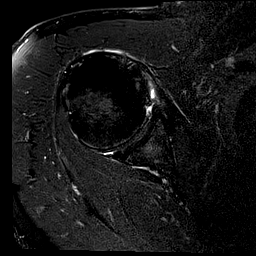
[im 14/25]
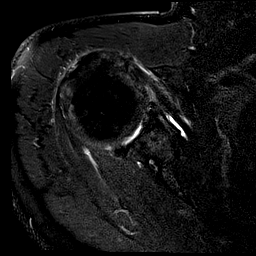
[im 18/25]
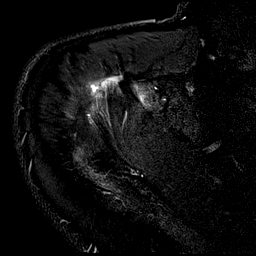
[im 21/25]
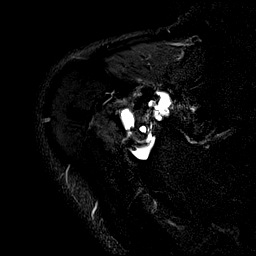
[im 25/25]
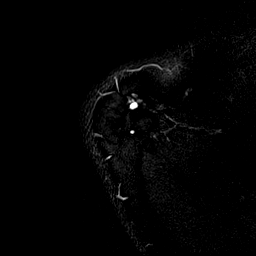

[Series 4: T2 fat-sat · oblique · 4.0mm · 0.59mm/px · 7 of 21 slices shown (2 of 3)]
[im 1/21]
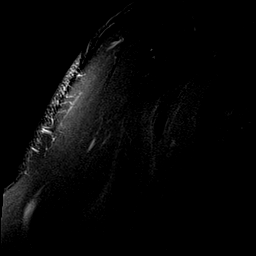
[im 4/21]
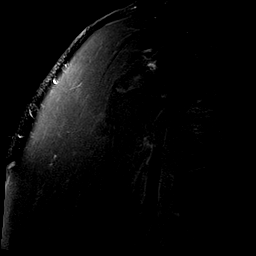
[im 7/21]
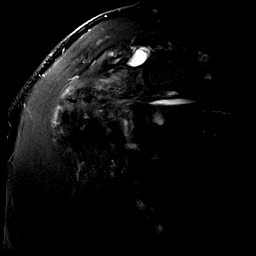
[im 11/21]
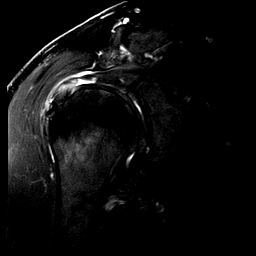
[im 14/21]
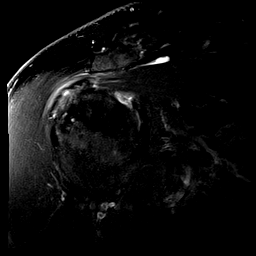
[im 17/21]
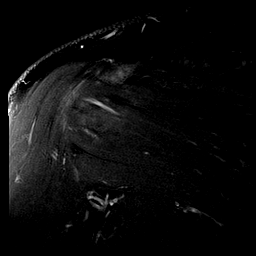
[im 21/21]
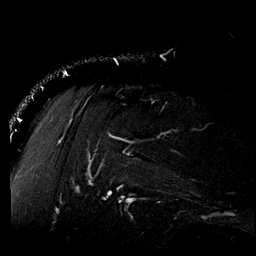

[Series 5: PD · oblique · 4.0mm · 0.29mm/px · 7 of 21 slices shown]
[im 1/21]
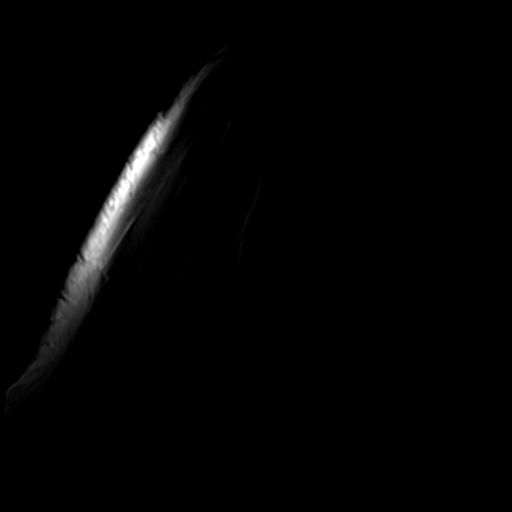
[im 4/21]
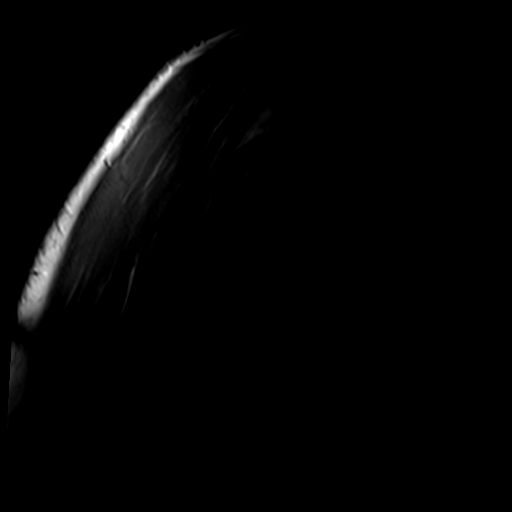
[im 7/21]
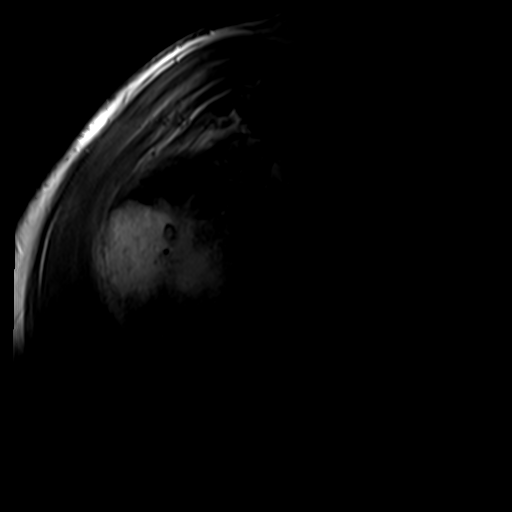
[im 11/21]
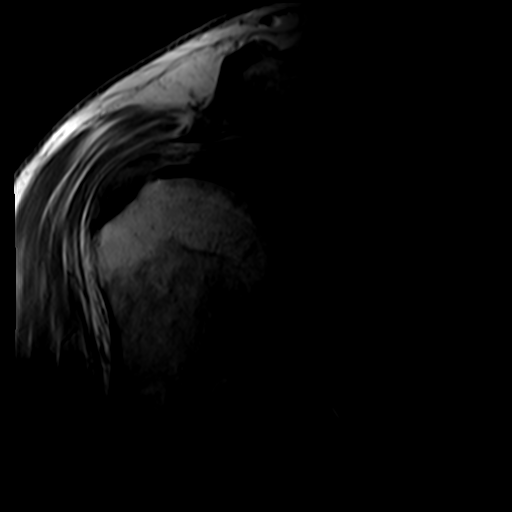
[im 14/21]
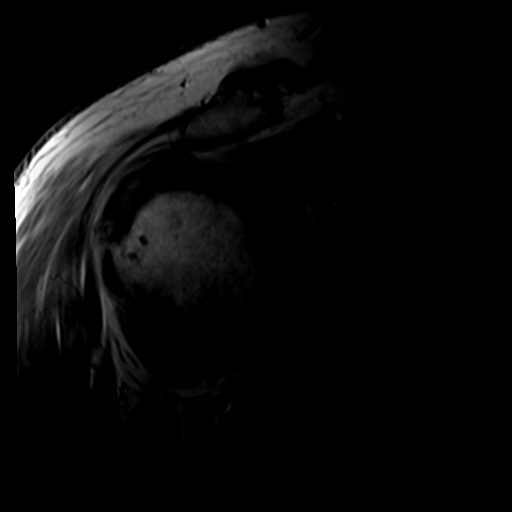
[im 17/21]
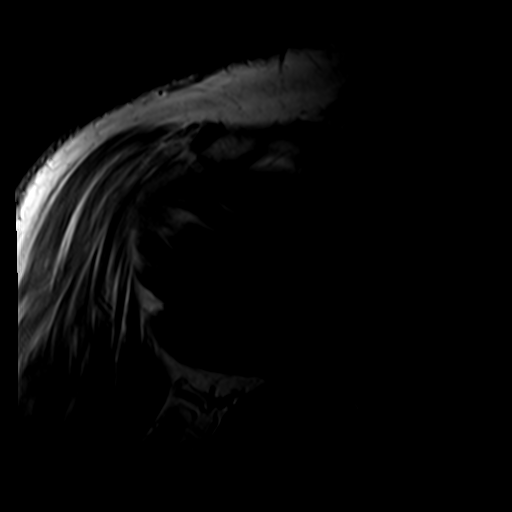
[im 21/21]
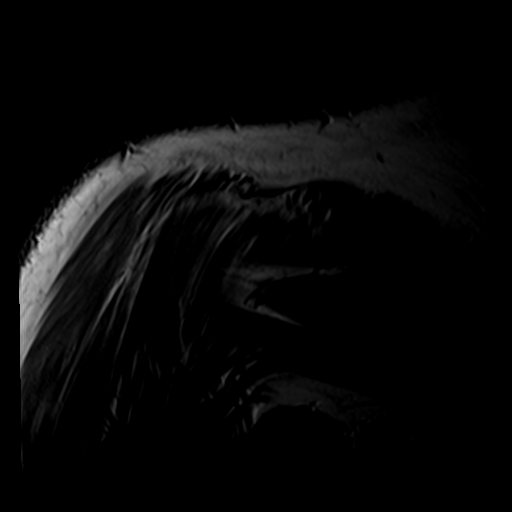

[Series 6: T2 fat-sat · oblique · 4.0mm · 0.59mm/px · 9 of 25 slices shown (3 of 3)]
[im 1/25]
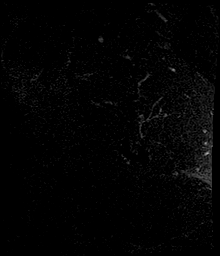
[im 4/25]
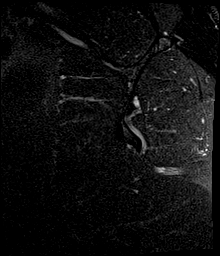
[im 7/25]
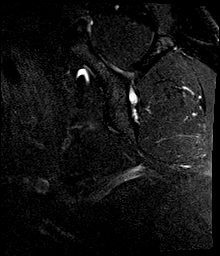
[im 10/25]
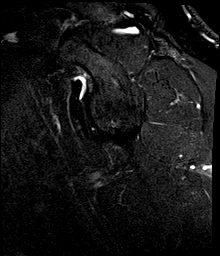
[im 13/25]
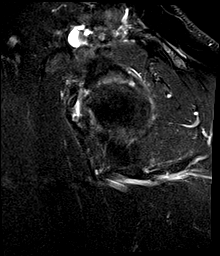
[im 16/25]
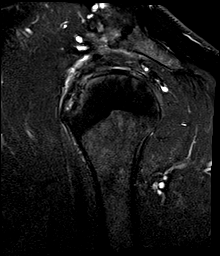
[im 19/25]
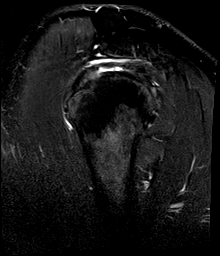
[im 22/25]
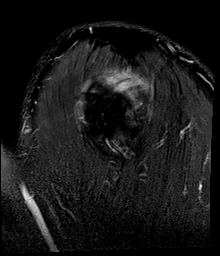
[im 25/25]
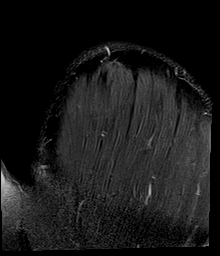

[Series 7: T1 · oblique · 4.0mm · 0.29mm/px · 4 of 25 slices shown]
[im 1/25]
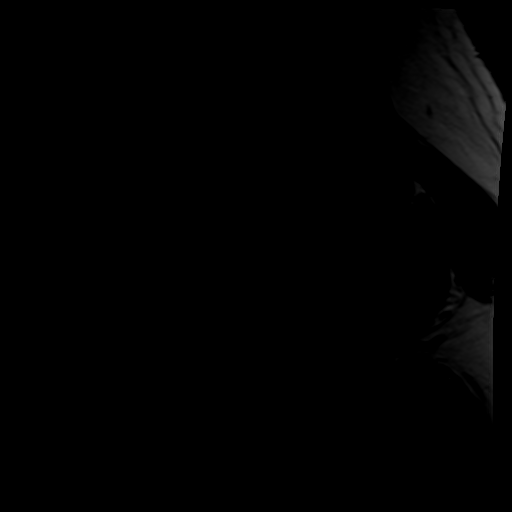
[im 4/25]
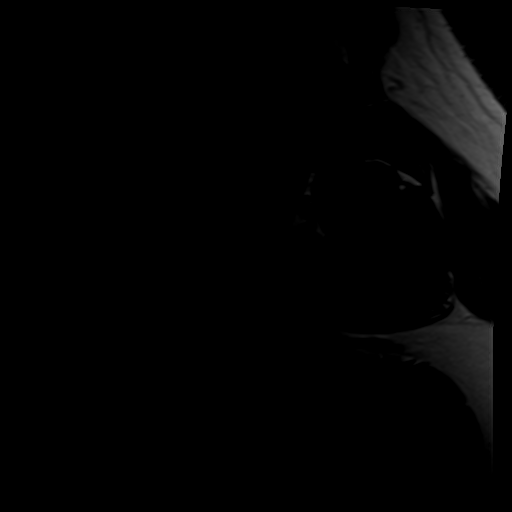
[im 7/25]
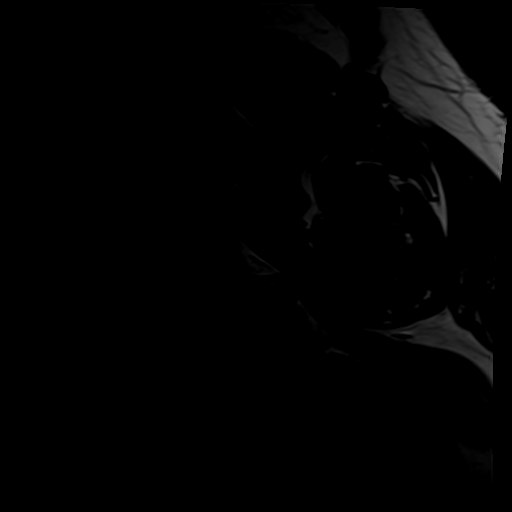
[im 10/25]
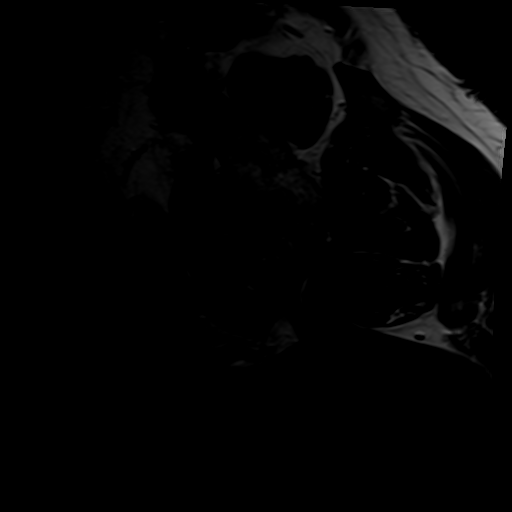

[35 of 40 positions shown; findings below may reference images not displayed]

FINDINGS: Rotator cuff: Small full-thickness partial width tear of the
supraspinatus tendon is observed on image 6 of series 6 and images
10-11 of series 4. Adjacent partial thickness articular surface
tearing in the supraspinatus. Mild partial thickness articular
surface tearing of the subscapularis. Moderate supraspinatus and
infraspinatus tendinopathy with mild subscapularis tendinopathy.

Muscles:  Unremarkable

Biceps long head: Poor visualization of the intra-articular segment
suspicious for biceps tear although I am skeptical that the biceps
is completely ruptured.

Acromioclavicular Joint: Moderate spurring with fluid signal in the
AC joint and mild subcortical marrow edema. Appearance compatible
with moderate AC joint arthropathy. This indents the myotendinous
junction of the supraspinatus. Type II acromion. Small amount of
fluid in the subacromial subdeltoid bursa.

Glenohumeral Joint: Moderate chondral thinning centrally along the
glenoid. No significant degree of synovitis in the rotator interval.

Labrum: Linear accentuated signal in the posterosuperior labrum on
images 10-11 of series 3 suspicious for a small nondisplaced SLAP
tear.

Bones: No significant extra-articular osseous abnormalities
identified.

Other: No supplemental non-categorized findings.
IMPRESSION: 1. Small full-thickness partial width tear of the supraspinatus
tendon with adjacent partial thickness articular surface tearing.
2. Moderate supraspinatus and infraspinatus tendinopathy with mild
subscapularis tendinopathy.
3. Advanced tendinopathy or partial tearing of the intra-articular
segment of long head of the biceps.
4. Moderate degenerative AC joint arthropathy. Moderate degenerative
chondral thinning along the glenoid.
5. Linear accentuated signal in the posterosuperior labrum on axial
images suspicious for a small nondisplaced SLAP tear.

## 2021-06-04 IMAGING — MR MR SHOULDER*L* W/O CM
5 series · 35 of 40 positions shown · non-contrast
Comparison: Radiographs [DATE]

CLINICAL DATA: Left shoulder pain

EXAM:
MRI OF THE LEFT SHOULDER WITHOUT CONTRAST
TECHNIQUE: Multiplanar, multisequence MR imaging of the shoulder was performed.
No intravenous contrast was administered.

[Series 3: T2 fat-sat · axial · 4.0mm · 0.59mm/px · z∈[-113,-9]mm · 8 of 26 slices shown (1 of 3)]
[im 1/26]
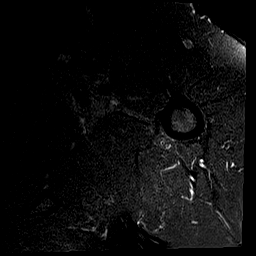
[im 4/26]
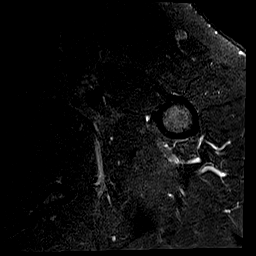
[im 8/26]
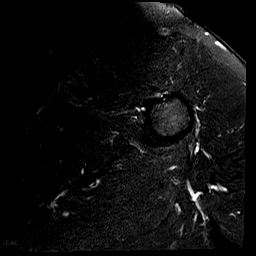
[im 11/26]
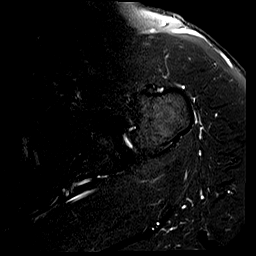
[im 15/26]
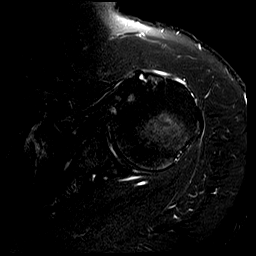
[im 18/26]
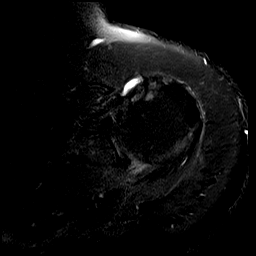
[im 22/26]
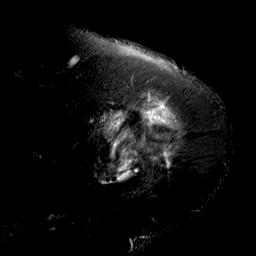
[im 26/26]
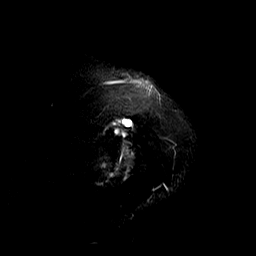

[Series 5: PD · oblique · 4.0mm · 0.29mm/px · 7 of 19 slices shown]
[im 1/19]
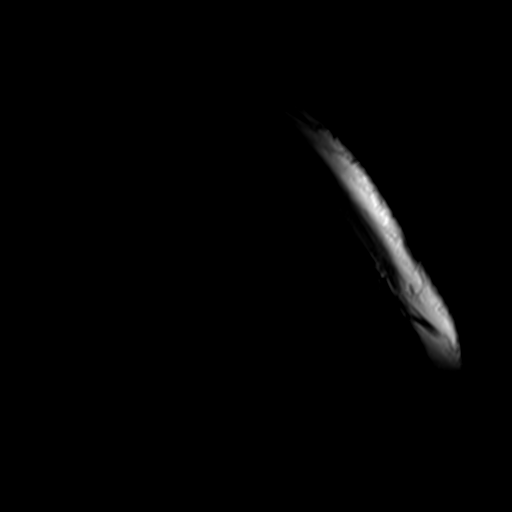
[im 4/19]
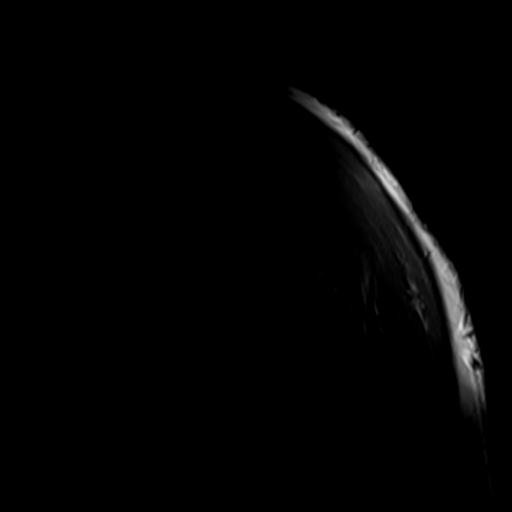
[im 7/19]
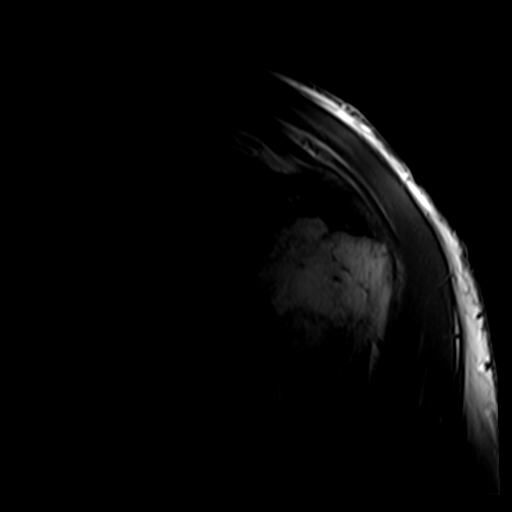
[im 10/19]
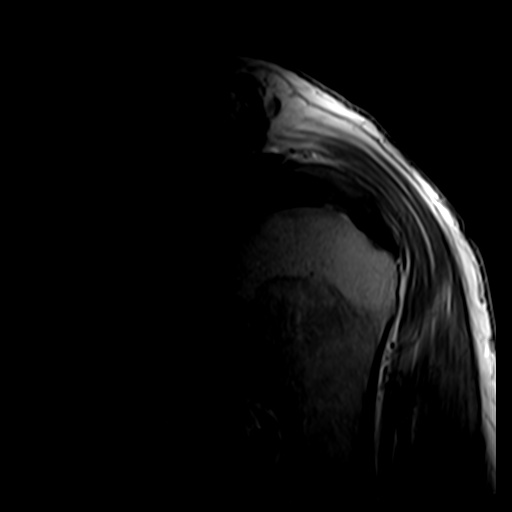
[im 13/19]
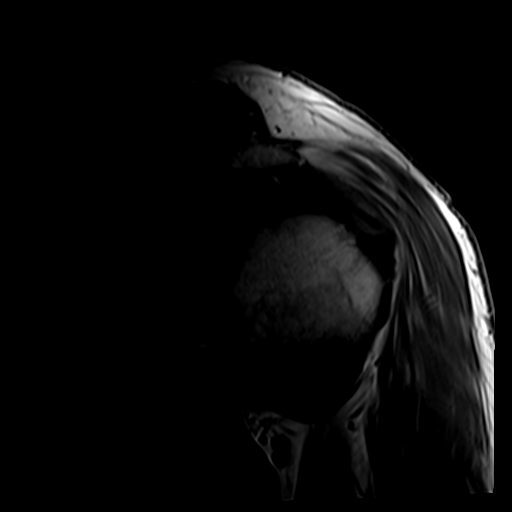
[im 16/19]
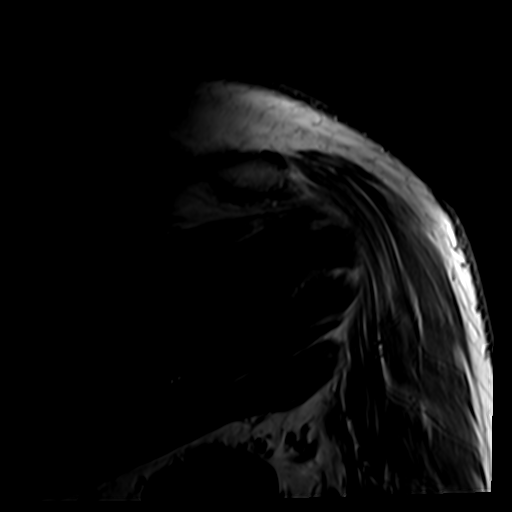
[im 19/19]
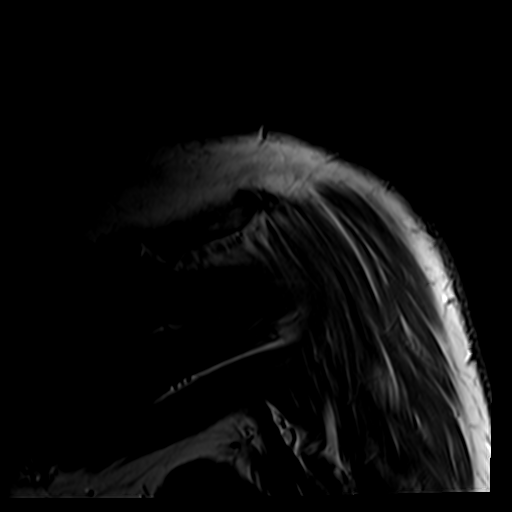

[Series 8: T2 fat-sat · oblique · 4.0mm · 0.55mm/px · 9 of 25 slices shown (2 of 3)]
[im 1/25]
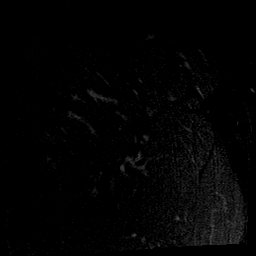
[im 4/25]
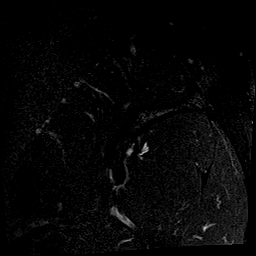
[im 7/25]
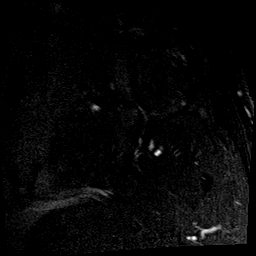
[im 10/25]
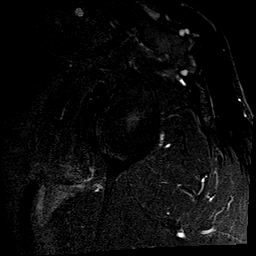
[im 13/25]
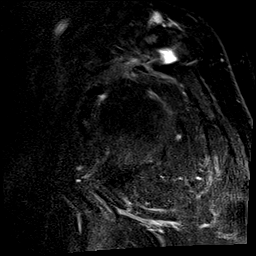
[im 16/25]
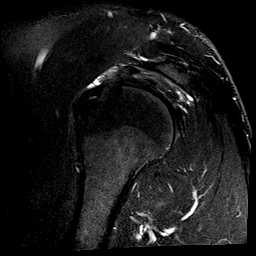
[im 19/25]
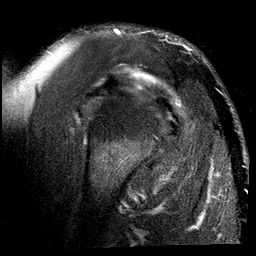
[im 22/25]
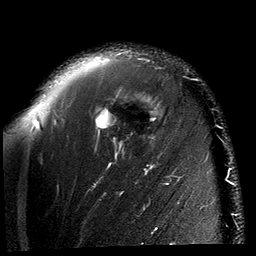
[im 25/25]
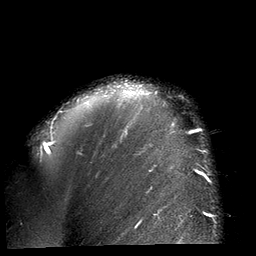

[Series 9: T1 · oblique · 4.0mm · 0.27mm/px · 4 of 25 slices shown]
[im 1/25]
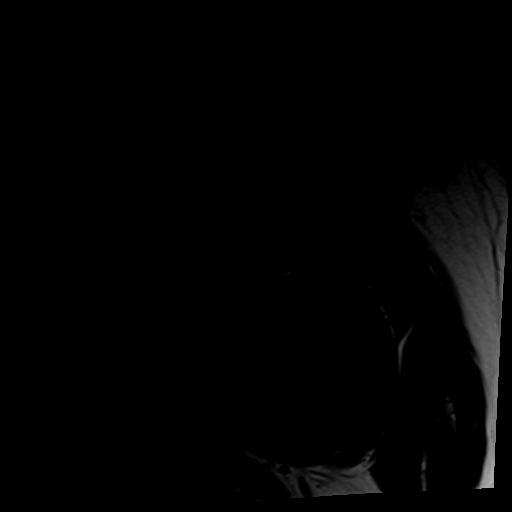
[im 4/25]
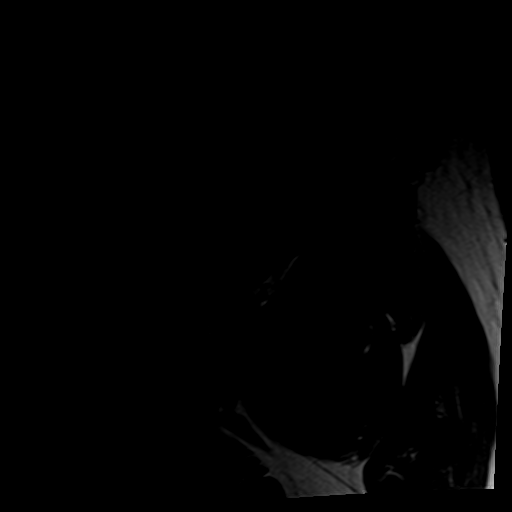
[im 7/25]
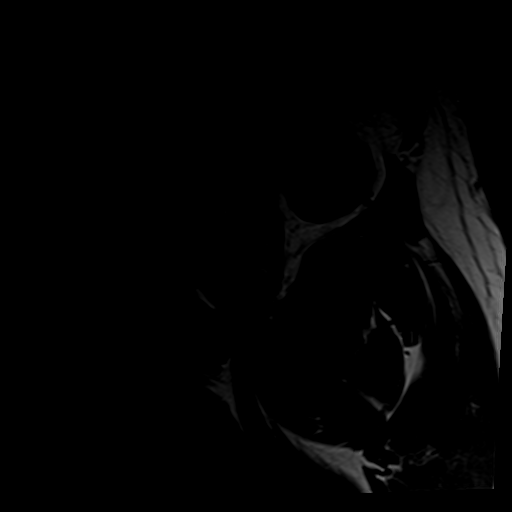
[im 10/25]
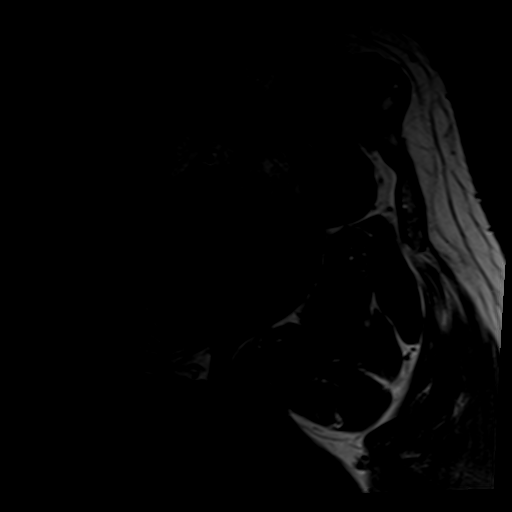

[Series 10: T2 fat-sat · oblique · 4.0mm · 0.59mm/px · 7 of 19 slices shown (3 of 3)]
[im 1/19]
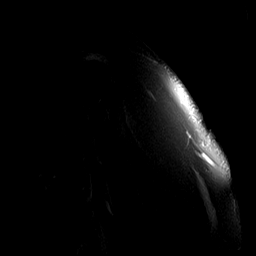
[im 4/19]
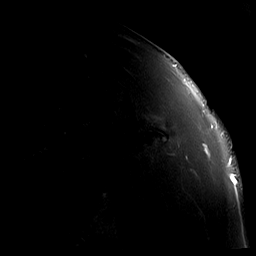
[im 7/19]
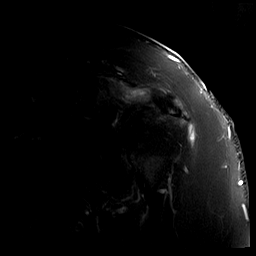
[im 10/19]
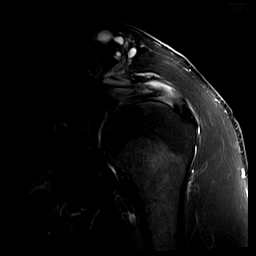
[im 13/19]
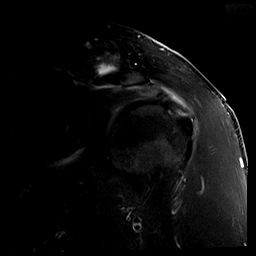
[im 16/19]
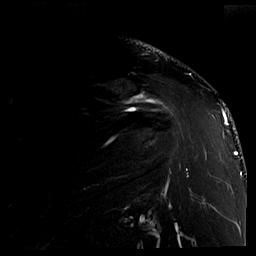
[im 19/19]
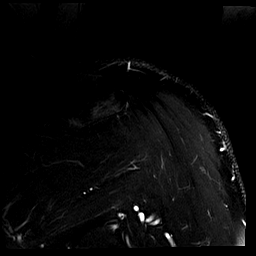

[35 of 40 positions shown; findings below may reference images not displayed]

FINDINGS: Despite efforts by the technologist and patient, motion artifact is
present on today's exam and could not be eliminated. This reduces
exam sensitivity and specificity.

Rotator cuff: Prominent degenerative partial-thickness tearing along
the bursal surface of the supraspinatus on image 9 series 10, but
without definite full-thickness fluid-filled gap to indicate a
full-thickness tear. Prominent localized tendinopathy in the
supraspinatus tendon. Fissuring in the distal infraspinatus tendon
noted as on images 6-5 of series 8. Thinning of the distal
subscapularis tendon.

Muscles:  Unremarkable

Biceps long head: Poor visualization of the long head of the biceps,
potentially ruptured.

Acromioclavicular Joint: Prominent spurring with subcortical marrow
edema, fluid in the joint, and small confluent cystic lesions
sitting just above the AC joint compatible with prominent AC joint
arthropathy. The inferior spurring indents the supraspinatus muscle.
Type II acromion. Mild subacromial subdeltoid bursitis.

Glenohumeral Joint: No overt synovitis in the rotator interval or
thickening of the axillary pouch. Chondral thinning superiorly along
the glenoid. Mild glenoid spurring.

Labrum:  Grossly unremarkable

Bones: No significant extra-articular osseous abnormalities
identified.

Other: No supplemental non-categorized findings.
IMPRESSION: 1. Partial-thickness bursal surface tearing of the supraspinatus
tendon with prominent supraspinatus tendinopathy. Fissuring in the
distal infraspinatus tendon and mild thinning of the distal
subscapularis tendon.
2. Nonvisualization of the long head of the biceps, potentially
ruptured.
3. Prominent degenerative AC joint arthropathy and mild to moderate
degenerative glenohumeral arthropathy. No overt synovitis along the
rotator interval.
4. Mild subacromial subdeltoid bursitis.

## 2021-06-10 ENCOUNTER — Telehealth: Payer: Self-pay | Admitting: Internal Medicine

## 2021-06-10 NOTE — Telephone Encounter (Signed)
Left message for patient to call me back at [phone] to schedule Medicare Annual Wellness Visit   No hx of AWV eligible as of 07/20/18  Please schedule at anytime with [facility] Advisor if patient calls the office back.    63 Minutes appointment   Any questions, please call me at [phone]
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
## 2021-06-11 ENCOUNTER — Ambulatory Visit: Payer: Medicare HMO | Admitting: Family Medicine

## 2021-06-17 ENCOUNTER — Other Ambulatory Visit: Payer: Self-pay | Admitting: Family Medicine

## 2021-06-17 ENCOUNTER — Other Ambulatory Visit: Payer: Self-pay | Admitting: Internal Medicine
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
## 2021-07-01 ENCOUNTER — Ambulatory Visit: Payer: Medicare HMO | Admitting: Family Medicine

## 2021-07-15 ENCOUNTER — Other Ambulatory Visit: Payer: Self-pay | Admitting: Internal Medicine
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
## 2021-07-29 ENCOUNTER — Ambulatory Visit: Payer: Self-pay

## 2021-07-29 ENCOUNTER — Encounter: Payer: Self-pay | Admitting: Family Medicine

## 2021-07-29 ENCOUNTER — Ambulatory Visit (INDEPENDENT_AMBULATORY_CARE_PROVIDER_SITE_OTHER): Payer: Medicare HMO | Admitting: Family Medicine

## 2021-07-29 ENCOUNTER — Other Ambulatory Visit: Payer: Self-pay

## 2021-07-29 VITALS — BP 108/86 | HR 57 | Ht 71.0 in | Wt 269.0 lb

## 2021-07-29 DIAGNOSIS — M25512 Pain in left shoulder: Secondary | ICD-10-CM

## 2021-07-29 DIAGNOSIS — M7061 Trochanteric bursitis, right hip: Secondary | ICD-10-CM

## 2021-07-29 DIAGNOSIS — M19012 Primary osteoarthritis, left shoulder: Secondary | ICD-10-CM

## 2021-07-29 DIAGNOSIS — M25511 Pain in right shoulder: Secondary | ICD-10-CM

## 2021-07-29 DIAGNOSIS — G8929 Other chronic pain: Secondary | ICD-10-CM | POA: Diagnosis not present

## 2021-07-29 DIAGNOSIS — M19011 Primary osteoarthritis, right shoulder: Secondary | ICD-10-CM

## 2021-07-29 DIAGNOSIS — M7062 Trochanteric bursitis, left hip: Secondary | ICD-10-CM

## 2021-07-29 NOTE — Assessment & Plan Note (Signed)
Patient has had significant arthritic changes and will consider knee injections again.  Also has rotator cuff arthropathy of the right greater than left.  We will continue to monitor.  May need repeat injection in 6 weeks.

## 2021-07-29 NOTE — Patient Instructions (Signed)
Injected hips today See me in 4-5 weeks for the shoulders

## 2021-07-29 NOTE — Assessment & Plan Note (Signed)
Bilateral injection given today, tolerated the procedure well, discussed icing regimen and home exercises, discussed which activities to do which wants to avoid.  Increase activity slowly.  Patient will do topical anti-inflammatories as well.  We will need to monitor for any type of arthritic changes.

## 2021-08-07 ENCOUNTER — Other Ambulatory Visit: Payer: Self-pay | Admitting: Internal Medicine

## 2021-08-20 ENCOUNTER — Other Ambulatory Visit: Payer: Self-pay | Admitting: Internal Medicine

## 2021-08-22 ENCOUNTER — Other Ambulatory Visit: Payer: Self-pay | Admitting: Internal Medicine

## 2021-09-09 ENCOUNTER — Other Ambulatory Visit: Payer: Self-pay | Admitting: Internal Medicine

## 2021-09-10 ENCOUNTER — Telehealth: Payer: Self-pay | Admitting: Internal Medicine

## 2021-09-10 ENCOUNTER — Other Ambulatory Visit: Payer: Self-pay

## 2021-09-10 MED ORDER — TESTOSTERONE 20.25 MG/ACT (1.62%) TD GEL: TRANSDERMAL | 0 refills | Status: DC

## 2021-09-10 MED ORDER — METOPROLOL TARTRATE 25 MG PO TABS: 25.0000 mg | ORAL_TABLET | Freq: Two times a day (BID) | ORAL | 0 refills | Status: DC

## 2021-09-10 NOTE — Telephone Encounter (Signed)
Spoke with wife today.  Refill resent and patient advised to check with pharmacy.  Also informed her to have patient call office to schedule follow up as he is past due for 6 month check up.

## 2021-09-10 NOTE — Telephone Encounter (Signed)
..  Caller name:[name] Heinrichs  Caller callback #:  [phone]  Encourage patient to contact the pharmacy for refills or they can request refills through [facility]  (Please schedule appointment if patient has not been seen in over a year)  MEDICATION NAME & DOSE:  Testosterone and Metropolol  Notes/Comments from patient:  [place] [facility] [place] [address]  Please notify patient: It takes 48-72 hours to process rx refill requests Ask patient to call pharmacy to ensure rx is ready before heading there.   (CLINICAL TO FILL OR ROUTE PER PROTOCOLS)

## 2021-09-13 ENCOUNTER — Other Ambulatory Visit: Payer: Self-pay | Admitting: Internal Medicine
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
## 2021-09-20 ENCOUNTER — Ambulatory Visit: Payer: Medicare HMO | Admitting: Family Medicine

## 2021-09-20 ENCOUNTER — Ambulatory Visit: Payer: Self-pay

## 2021-09-20 ENCOUNTER — Other Ambulatory Visit: Payer: Self-pay

## 2021-09-20 ENCOUNTER — Encounter: Payer: Self-pay | Admitting: Family Medicine

## 2021-09-20 VITALS — BP 116/74 | HR 57 | Ht 71.0 in | Wt 271.0 lb

## 2021-09-20 DIAGNOSIS — M19012 Primary osteoarthritis, left shoulder: Secondary | ICD-10-CM | POA: Diagnosis not present

## 2021-09-20 DIAGNOSIS — M67919 Unspecified disorder of synovium and tendon, unspecified shoulder: Secondary | ICD-10-CM | POA: Insufficient documentation

## 2021-09-20 DIAGNOSIS — M19011 Primary osteoarthritis, right shoulder: Secondary | ICD-10-CM

## 2021-09-20 NOTE — Assessment & Plan Note (Signed)
Partial tearing noted.  Patient is going to continue the home exercises.  Patient given bilateral glenohumeral injections today that hopefully will be beneficial as well.  Warned of potential side effects.  Patient knows if any inflammation or redness to seek medical attention.  Patient still wants to avoid surgical intervention but understands the underlying problem of the shoulder.  Follow-up again in 3 months

## 2021-09-20 NOTE — Assessment & Plan Note (Signed)
Chronic problem with severe arthritic changes.  Once again discussed with patient.  Patient feels like the injections are helpful.  Given bilateral injections again today.  Patient wants to avoid surgical intervention but we have discussed that we may need to consider that in the future.  Patient will continue with home exercises otherwise.  Follow-up with me again in 3 months

## 2021-09-20 NOTE — Patient Instructions (Addendum)
Injections today We lubricated the shoulders well Keep hands in peripheral vision Ice is your friend See you again in 3 months

## 2021-09-25 NOTE — Telephone Encounter (Signed)
Patient requesting refill for Testosterone 20.25 MG/ACT (1.62%) GEL  Informed patient ov is needed, patient's next ov is 12-16  Patient was last seen 01-09-2020

## 2021-09-26 ENCOUNTER — Other Ambulatory Visit: Payer: Self-pay

## 2021-09-26 MED ORDER — TESTOSTERONE 20.25 MG/ACT (1.62%) TD GEL: TRANSDERMAL | 0 refills | Status: DC

## 2021-09-26 NOTE — Telephone Encounter (Signed)
Faxed today.  He must keep appointment next week.

## 2021-10-04 ENCOUNTER — Ambulatory Visit: Payer: Medicare HMO | Admitting: Internal Medicine

## 2021-10-19 ENCOUNTER — Other Ambulatory Visit: Payer: Self-pay | Admitting: Internal Medicine

## 2021-10-27 ENCOUNTER — Encounter: Payer: Self-pay | Admitting: Internal Medicine
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
## 2021-10-27 NOTE — Patient Instructions (Addendum)
° °  Schedule your colonoscopy -- [facility] Phone: [phone]  Consider getting the shingles vaccine-need to get this here or at any pharmacy.    Blood work was ordered.     Medications changes include :   zetia 10 mg daily  Your prescription(s) have been submitted to your pharmacy. Please take as directed and contact our office if you believe you are having problem(s) with the medication(s).   Please followup in 6 months

## 2021-10-28 ENCOUNTER — Other Ambulatory Visit: Payer: Self-pay

## 2021-10-28 ENCOUNTER — Ambulatory Visit (INDEPENDENT_AMBULATORY_CARE_PROVIDER_SITE_OTHER): Payer: Medicare HMO | Admitting: Internal Medicine

## 2021-10-28 VITALS — BP 118/70 | HR 74 | Temp 98.2°F | Ht 71.0 in | Wt 270.0 lb

## 2021-10-28 DIAGNOSIS — E782 Mixed hyperlipidemia: Secondary | ICD-10-CM | POA: Diagnosis not present

## 2021-10-28 DIAGNOSIS — I10 Essential (primary) hypertension: Secondary | ICD-10-CM

## 2021-10-28 DIAGNOSIS — E038 Other specified hypothyroidism: Secondary | ICD-10-CM | POA: Diagnosis not present

## 2021-10-28 DIAGNOSIS — R252 Cramp and spasm: Secondary | ICD-10-CM | POA: Diagnosis not present

## 2021-10-28 DIAGNOSIS — E349 Endocrine disorder, unspecified: Secondary | ICD-10-CM | POA: Diagnosis not present

## 2021-10-28 DIAGNOSIS — E291 Testicular hypofunction: Secondary | ICD-10-CM | POA: Diagnosis not present

## 2021-10-28 DIAGNOSIS — R7303 Prediabetes: Secondary | ICD-10-CM | POA: Diagnosis not present

## 2021-10-28 LAB — COMPREHENSIVE METABOLIC PANEL
ALT: 28 U/L (ref 0–53)
AST: 19 U/L (ref 0–37)
Albumin: 4.4 g/dL (ref 3.5–5.2)
Alkaline Phosphatase: 61 U/L (ref 39–117)
BUN: 16 mg/dL (ref 6–23)
CO2: 29 mEq/L (ref 19–32)
Calcium: 9.5 mg/dL (ref 8.4–10.5)
Chloride: 104 mEq/L (ref 96–112)
Creatinine, Ser: 1.31 mg/dL (ref 0.40–1.50)
GFR: 55.66 mL/min — ABNORMAL LOW (ref >60.00)
Glucose, Bld: 98 mg/dL (ref 70–99)
Potassium: 4.7 mEq/L (ref 3.5–5.1)
Sodium: 140 mEq/L (ref 135–145)
Total Bilirubin: 0.6 mg/dL (ref 0.2–1.2)
Total Protein: 7.1 g/dL (ref 6.0–8.3)

## 2021-10-28 LAB — CBC WITH DIFFERENTIAL/PLATELET
Basophils Absolute: 0.1 10*3/uL (ref 0.0–0.1)
Basophils Relative: 0.8 % (ref 0.0–3.0)
Eosinophils Absolute: 0.2 10*3/uL (ref 0.0–0.7)
Eosinophils Relative: 2.4 % (ref 0.0–5.0)
HCT: 42.1 % (ref 39.0–52.0)
Hemoglobin: 14.3 g/dL (ref 13.0–17.0)
Lymphocytes Relative: 24 % (ref 12.0–46.0)
Lymphs Abs: 1.8 10*3/uL (ref 0.7–4.0)
MCHC: 33.9 g/dL (ref 30.0–36.0)
MCV: 94.3 fl (ref 78.0–100.0)
Monocytes Absolute: 0.5 10*3/uL (ref 0.1–1.0)
Monocytes Relative: 7.2 % (ref 3.0–12.0)
Neutro Abs: 4.8 10*3/uL (ref 1.4–7.7)
Neutrophils Relative %: 65.6 % (ref 43.0–77.0)
Platelets: 236 10*3/uL (ref 150.0–400.0)
RBC: 4.46 Mil/uL (ref 4.22–5.81)
RDW: 14.3 % (ref 11.5–15.5)
WBC: 7.4 10*3/uL (ref 4.0–10.5)

## 2021-10-28 LAB — LIPID PANEL
Cholesterol: 199 mg/dL (ref 0–200)
HDL: 36.6 mg/dL — ABNORMAL LOW (ref >39.00)
LDL Cholesterol: 131 mg/dL — ABNORMAL HIGH (ref 0–99)
NonHDL: 162.12
Total CHOL/HDL Ratio: 5
Triglycerides: 157 mg/dL — ABNORMAL HIGH (ref 0.0–149.0)
VLDL: 31.4 mg/dL (ref 0.0–40.0)

## 2021-10-28 LAB — MAGNESIUM: Magnesium: 2.1 mg/dL (ref 1.5–2.5)

## 2021-10-28 LAB — TSH: TSH: 0.81 u[IU]/mL (ref 0.35–5.50)

## 2021-10-28 LAB — PSA: PSA: 0.45 ng/mL (ref 0.10–4.00)

## 2021-10-28 LAB — HEMOGLOBIN A1C: Hgb A1c MFr Bld: 6.3 % (ref 4.6–6.5)

## 2021-10-28 MED ORDER — EZETIMIBE 10 MG PO TABS: 10.0000 mg | ORAL_TABLET | Freq: Every day | ORAL | 3 refills | Status: DC

## 2021-10-28 NOTE — Assessment & Plan Note (Addendum)
Acute B/l  calf or thigh, occ side cramping at night and during day Drinking poweraide and water Check magnesium level ? Dehydration - does not seem likely ? orthopedic

## 2021-10-28 NOTE — Assessment & Plan Note (Addendum)
Chronic Has not tolerated Lipitor or Crestor Regular exercise and healthy diet encouraged Check lipid panel  Currently lifestyle controlled Discussed Zetia - he agrees to try - zetia 10 mg daily  Check lipids, hepatic function in 2 months

## 2021-10-28 NOTE — Assessment & Plan Note (Signed)
Chronic  Clinically euthyroid Currently taking levothyroxine 175 mcg daily Check tsh  Titrate med dose if needed 

## 2021-10-28 NOTE — Assessment & Plan Note (Signed)
Chronic Blood pressure well controlled CMP Continue lisinopril 20 mg daily, metoprolol 25 mg twice daily

## 2021-10-28 NOTE — Assessment & Plan Note (Signed)
Chronic Taking testosterone-4 pumps daily Check testosterone level, PSA, CBC Continue current dose-we will adjust depending on testosterone level

## 2021-10-28 NOTE — Assessment & Plan Note (Signed)
Chronic Check a1c Low sugar / carb diet Stressed regular exercise  

## 2021-10-31 LAB — TESTOSTERONE, FREE & TOTAL
Free Testosterone: 57.5 pg/mL (ref 35.0–155.0)
Testosterone, Total, LC-MS-MS: 330 ng/dL (ref 250–1100)

## 2021-11-03 ENCOUNTER — Other Ambulatory Visit: Payer: Self-pay | Admitting: Internal Medicine

## 2021-12-02 ENCOUNTER — Encounter: Payer: Self-pay | Admitting: Internal Medicine

## 2021-12-02 ENCOUNTER — Ambulatory Visit: Payer: Medicare HMO | Admitting: Internal Medicine
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
## 2021-12-04 ENCOUNTER — Ambulatory Visit (INDEPENDENT_AMBULATORY_CARE_PROVIDER_SITE_OTHER): Payer: Medicare HMO | Admitting: Internal Medicine

## 2021-12-04 ENCOUNTER — Other Ambulatory Visit: Payer: Self-pay

## 2021-12-04 VITALS — BP 110/80 | HR 65 | Temp 98.4°F | Ht 71.0 in | Wt 270.0 lb

## 2021-12-04 DIAGNOSIS — I10 Essential (primary) hypertension: Secondary | ICD-10-CM | POA: Diagnosis not present

## 2021-12-04 NOTE — Assessment & Plan Note (Signed)
Chronic Recently had a few very low blood pressure readings and blood pressure is overcontrolled Discontinue metoprolol We will continue lisinopril 20 mg daily, which she will restart tomorrow-discussed that he may need a smaller dose of this as well-?  Need 10 mg daily He will start monitoring his blood pressure on a regular basis over the next couple of weeks and his heart rate so that we can adjust the medication as needed EKG today: Sinus bradycardia 57 bpm, otherwise normal EKG compared to his EKG from 2019 the T wave abnormality is no longer present

## 2021-12-04 NOTE — Patient Instructions (Addendum)
° ° °  Goal BP 110-140/60-80  An EKG was done today.    Medications changes include :   stop taking the metoprolol.  Continue lisinopril 20 mg daily for now.   Monitor your BP at home.

## 2021-12-09 ENCOUNTER — Other Ambulatory Visit: Payer: Self-pay | Admitting: Internal Medicine
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
## 2021-12-16 ENCOUNTER — Ambulatory Visit: Payer: Medicare HMO | Admitting: Family Medicine

## 2021-12-16 ENCOUNTER — Ambulatory Visit: Payer: Self-pay

## 2021-12-16 ENCOUNTER — Other Ambulatory Visit: Payer: Self-pay

## 2021-12-16 VITALS — BP 138/86 | HR 52 | Ht 71.0 in | Wt 277.0 lb

## 2021-12-16 DIAGNOSIS — M12812 Other specific arthropathies, not elsewhere classified, left shoulder: Secondary | ICD-10-CM | POA: Diagnosis not present

## 2021-12-16 DIAGNOSIS — G8929 Other chronic pain: Secondary | ICD-10-CM

## 2021-12-16 DIAGNOSIS — M19012 Primary osteoarthritis, left shoulder: Secondary | ICD-10-CM

## 2021-12-16 DIAGNOSIS — M12811 Other specific arthropathies, not elsewhere classified, right shoulder: Secondary | ICD-10-CM

## 2021-12-16 DIAGNOSIS — M19011 Primary osteoarthritis, right shoulder: Secondary | ICD-10-CM

## 2021-12-16 NOTE — Assessment & Plan Note (Signed)
Chronic problem with exacerbation.  Discussed with patient again that we would consider possible surgical intervention.  Patient declined any surgical intervention but does need to continue to be active with patient's job.  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  Discussed with patient that this could potentially contribute to worsen.  Patient wants to continue with conservative therapy and follow-up with me again in 10 to 12 weeks otherwise.

## 2021-12-16 NOTE — Patient Instructions (Signed)
Injected shoulder today Tell others to do heavy lifting See me in 10-12 weeks

## 2021-12-16 NOTE — Assessment & Plan Note (Signed)
Bilateral injections given, tolerated the procedure well, discussed icing regimen and home exercises, discussed which activities to do which wants to avoid.  Once again discussed the possibility of surgical intervention secondary to the retraction noted on the right side.  Patient declined.  Feels like he would rather do the injections every 3 months.  Patient knows that there is a possibility that he may need a shoulder replacement at a later date then.  Patient will follow-up with me again 10 to 12 weeks

## 2021-12-27 DIAGNOSIS — N202 Calculus of kidney with calculus of ureter: Secondary | ICD-10-CM | POA: Diagnosis not present

## 2022-01-24 ENCOUNTER — Other Ambulatory Visit: Payer: Self-pay | Admitting: Internal Medicine
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
## 2022-03-13 ENCOUNTER — Ambulatory Visit: Payer: Self-pay

## 2022-03-13 ENCOUNTER — Ambulatory Visit: Payer: Medicare HMO | Admitting: Family Medicine

## 2022-03-13 VITALS — BP 98/64 | HR 66 | Ht 71.0 in | Wt 270.0 lb

## 2022-03-13 DIAGNOSIS — M19011 Primary osteoarthritis, right shoulder: Secondary | ICD-10-CM | POA: Diagnosis not present

## 2022-03-13 DIAGNOSIS — M12812 Other specific arthropathies, not elsewhere classified, left shoulder: Secondary | ICD-10-CM

## 2022-03-13 DIAGNOSIS — M12811 Other specific arthropathies, not elsewhere classified, right shoulder: Secondary | ICD-10-CM

## 2022-03-13 DIAGNOSIS — M7062 Trochanteric bursitis, left hip: Secondary | ICD-10-CM

## 2022-03-13 DIAGNOSIS — M7061 Trochanteric bursitis, right hip: Secondary | ICD-10-CM

## 2022-03-13 DIAGNOSIS — M19012 Primary osteoarthritis, left shoulder: Secondary | ICD-10-CM | POA: Diagnosis not present

## 2022-03-13 MED ORDER — DOXYCYCLINE HYCLATE 100 MG PO TABS: 100.0000 mg | ORAL_TABLET | Freq: Two times a day (BID) | ORAL | 0 refills | Status: DC

## 2022-03-13 NOTE — Patient Instructions (Signed)
Antibiotics for 10 days just in case Injections today See you again in 3 months

## 2022-03-14 NOTE — Assessment & Plan Note (Signed)
Bilateral injections given today, tolerated the procedure well, discussed icing regimen and home exercises, discussed which activities to do and which ones to avoid.  Hopefully this will bring patient improved.  And otherwise follow-up in 6 to 8 weeks and can consider intra-articular glenohumeral injections

## 2022-03-14 NOTE — Assessment & Plan Note (Signed)
Bilateral injections given.  He tolerated the procedure well, discussed icing regimen and home exercises. Chronic problem with exacerbation.  Possibly worsening.  Patient does not want any advanced imaging because it would not change his management.  Patient would like to continue to try to do the home exercises and injections when necessary

## 2022-04-26 ENCOUNTER — Other Ambulatory Visit: Payer: Self-pay | Admitting: Internal Medicine

## 2022-05-03 DIAGNOSIS — T466X5A Adverse effect of antihyperlipidemic and antiarteriosclerotic drugs, initial encounter: Secondary | ICD-10-CM | POA: Insufficient documentation

## 2022-05-03 DIAGNOSIS — M609 Myositis, unspecified: Secondary | ICD-10-CM | POA: Insufficient documentation

## 2022-05-03 NOTE — Patient Instructions (Addendum)
     Call GI to schedule your colonoscopy - Phone: [phone]    Blood work was ordered.     Medications changes include :   decrease lisinopril to 10 mg daily   Your prescription(s) have been sent to your pharmacy.     Return in about 6 months (around 11/05/2022) for follow up.

## 2022-05-03 NOTE — Progress Notes (Signed)
Subjective:    Patient ID: Charles [name], male    DOB: 1951-09-15, 71 y.o.   MRN: [id number]     HPI Salaam is here for follow up of his chronic medical problems, including htn, hld, prediabetes, hypothyroid, testosterone def, obesity  Stopped statin  due to pain - ? zetia enough  No regular exercise.  Still working, walks dog.    Medications and allergies reviewed with patient and updated if appropriate.  Current Outpatient Medications on File Prior to Visit  Medication Sig Dispense Refill   allopurinol (ZYLOPRIM) 100 MG tablet TAKE 2 TABLETS BY MOUTH EVERY DAY 180 tablet 1   ezetimibe (ZETIA) 10 MG tablet Take 1 tablet (10 mg total) by mouth daily. 90 tablet 3   levothyroxine (SYNTHROID) 175 MCG tablet TAKE 1 TABLET BY MOUTH EVERY DAY BEFORE BREAKFAST 90 tablet 0   lisinopril (ZESTRIL) 20 MG tablet TAKE 1 TABLET BY MOUTH EVERY DAY 90 tablet 2   omeprazole (PRILOSEC) 20 MG capsule Take 20 mg by mouth daily.     tadalafil (CIALIS) 20 MG tablet TAKE ONE HALF OF TABLET TO ONE TABLET EVERY 3 DAYS AS NEEDED. (Patient taking differently: Take 10-20 mg by mouth daily as needed for erectile dysfunction.) 10 tablet 8   Testosterone 20.25 MG/ACT (1.62%) GEL APPLY 4 PUMPS DAILY TO UPPER ARM OR CHEST 150 g 5   No current facility-administered medications on file prior to visit.     Review of Systems  Constitutional:  Negative for fever.  Respiratory:  Positive for shortness of breath (with walking - chronic). Negative for cough and wheezing.   Cardiovascular:  Negative for chest pain, palpitations and leg swelling.  Musculoskeletal:  Negative for back pain.       Leg cramping at night  Neurological:  Negative for light-headedness and headaches.       Objective:   Vitals:   05/05/22 0805  BP: 108/78  Pulse: 74  Temp: 98.3 F (36.8 C)  SpO2: 99%   BP Readings from Last 3 Encounters:  05/05/22 108/78  03/13/22 98/64  12/16/21 138/86   Wt Readings from Last 3  Encounters:  05/05/22 267 lb (121.1 kg)  03/13/22 270 lb (122.5 kg)  12/16/21 277 lb (125.6 kg)   Body mass index is 37.24 kg/m.    Physical Exam Constitutional:      General: He is not in acute distress.    Appearance: Normal appearance. He is not ill-appearing.  HENT:     Head: Normocephalic and atraumatic.  Eyes:     Conjunctiva/sclera: Conjunctivae normal.  Cardiovascular:     Rate and Rhythm: Normal rate and regular rhythm.     Heart sounds: Normal heart sounds. No murmur heard. Pulmonary:     Effort: Pulmonary effort is normal. No respiratory distress.     Breath sounds: Normal breath sounds. No wheezing or rales.  Musculoskeletal:     Right lower leg: No edema.     Left lower leg: No edema.  Skin:    General: Skin is warm and dry.     Findings: No rash.  Neurological:     Mental Status: He is alert. Mental status is at baseline.  Psychiatric:        Mood and Affect: Mood normal.        Lab Results  Component Value Date   WBC 7.4 10/28/2021   HGB 14.3 10/28/2021   HCT 42.1 10/28/2021   PLT 236.0 10/28/2021  GLUCOSE 98 10/28/2021   CHOL 199 10/28/2021   TRIG 157.0 (H) 10/28/2021   HDL 36.60 (L) 10/28/2021   LDLDIRECT 124.0 07/11/2019   LDLCALC 131 (H) 10/28/2021   ALT 28 10/28/2021   AST 19 10/28/2021   NA 140 10/28/2021   K 4.7 10/28/2021   CL 104 10/28/2021   CREATININE 1.31 10/28/2021   BUN 16 10/28/2021   CO2 29 10/28/2021   TSH 0.81 10/28/2021   PSA 0.45 10/28/2021   HGBA1C 6.3 10/28/2021     Assessment & Plan:    See Problem List for Assessment and Plan of chronic medical problems.

## 2022-05-05 ENCOUNTER — Encounter: Payer: Self-pay | Admitting: Internal Medicine

## 2022-05-05 ENCOUNTER — Ambulatory Visit (INDEPENDENT_AMBULATORY_CARE_PROVIDER_SITE_OTHER): Payer: Medicare HMO | Admitting: Internal Medicine

## 2022-05-05 VITALS — BP 108/78 | HR 74 | Temp 98.3°F | Ht 71.0 in | Wt 267.0 lb

## 2022-05-05 DIAGNOSIS — E349 Endocrine disorder, unspecified: Secondary | ICD-10-CM | POA: Diagnosis not present

## 2022-05-05 DIAGNOSIS — E038 Other specified hypothyroidism: Secondary | ICD-10-CM

## 2022-05-05 DIAGNOSIS — I10 Essential (primary) hypertension: Secondary | ICD-10-CM | POA: Diagnosis not present

## 2022-05-05 DIAGNOSIS — E782 Mixed hyperlipidemia: Secondary | ICD-10-CM

## 2022-05-05 DIAGNOSIS — R7303 Prediabetes: Secondary | ICD-10-CM | POA: Diagnosis not present

## 2022-05-05 DIAGNOSIS — Z6838 Body mass index (BMI) 38.0-38.9, adult: Secondary | ICD-10-CM | POA: Diagnosis not present

## 2022-05-05 LAB — COMPREHENSIVE METABOLIC PANEL
ALT: 27 U/L (ref 0–53)
AST: 22 U/L (ref 0–37)
Albumin: 4.5 g/dL (ref 3.5–5.2)
Alkaline Phosphatase: 58 U/L (ref 39–117)
BUN: 14 mg/dL (ref 6–23)
CO2: 25 mEq/L (ref 19–32)
Calcium: 9.8 mg/dL (ref 8.4–10.5)
Chloride: 101 mEq/L (ref 96–112)
Creatinine, Ser: 1.35 mg/dL (ref 0.40–1.50)
GFR: 53.5 mL/min — ABNORMAL LOW (ref >60.00)
Glucose, Bld: 101 mg/dL — ABNORMAL HIGH (ref 70–99)
Potassium: 4.2 mEq/L (ref 3.5–5.1)
Sodium: 136 mEq/L (ref 135–145)
Total Bilirubin: 0.7 mg/dL (ref 0.2–1.2)
Total Protein: 7.2 g/dL (ref 6.0–8.3)

## 2022-05-05 LAB — CBC WITH DIFFERENTIAL/PLATELET
Basophils Absolute: 0.1 10*3/uL (ref 0.0–0.1)
Basophils Relative: 0.8 % (ref 0.0–3.0)
Eosinophils Absolute: 0.2 10*3/uL (ref 0.0–0.7)
Eosinophils Relative: 2 % (ref 0.0–5.0)
HCT: 45.5 % (ref 39.0–52.0)
Hemoglobin: 15.6 g/dL (ref 13.0–17.0)
Lymphocytes Relative: 22.5 % (ref 12.0–46.0)
Lymphs Abs: 2.2 10*3/uL (ref 0.7–4.0)
MCHC: 34.4 g/dL (ref 30.0–36.0)
MCV: 93.9 fl (ref 78.0–100.0)
Monocytes Absolute: 0.6 10*3/uL (ref 0.1–1.0)
Monocytes Relative: 6.1 % (ref 3.0–12.0)
Neutro Abs: 6.7 10*3/uL (ref 1.4–7.7)
Neutrophils Relative %: 68.6 % (ref 43.0–77.0)
Platelets: 256 10*3/uL (ref 150.0–400.0)
RBC: 4.85 Mil/uL (ref 4.22–5.81)
RDW: 14.7 % (ref 11.5–15.5)
WBC: 9.7 10*3/uL (ref 4.0–10.5)

## 2022-05-05 LAB — LIPID PANEL
Cholesterol: 181 mg/dL (ref 0–200)
HDL: 36.5 mg/dL — ABNORMAL LOW (ref >39.00)
LDL Cholesterol: 113 mg/dL — ABNORMAL HIGH (ref 0–99)
NonHDL: 144.06
Total CHOL/HDL Ratio: 5
Triglycerides: 154 mg/dL — ABNORMAL HIGH (ref 0.0–149.0)
VLDL: 30.8 mg/dL (ref 0.0–40.0)

## 2022-05-05 LAB — TSH: TSH: 7.77 u[IU]/mL — ABNORMAL HIGH (ref 0.35–5.50)

## 2022-05-05 LAB — HEMOGLOBIN A1C: Hgb A1c MFr Bld: 6.1 % (ref 4.6–6.5)

## 2022-05-05 MED ORDER — LISINOPRIL 10 MG PO TABS: 10.0000 mg | ORAL_TABLET | Freq: Every day | ORAL | 3 refills | Status: DC

## 2022-05-05 NOTE — Assessment & Plan Note (Signed)
Chronic  Clinically euthyroid Currently taking levothyroxine 175 mcg daily Check tsh  Titrate med dose if needed

## 2022-05-05 NOTE — Assessment & Plan Note (Addendum)
Chronic Blood pressure over controlled - on low side and he does feel weak at times CMP Continue lisinopril but decrease to 10 mg daily

## 2022-05-05 NOTE — Assessment & Plan Note (Signed)
Chronic Lab Results  Component Value Date   HGBA1C 6.3 10/28/2021    Check a1c Low sugar / carb diet Stressed regular exercise Advised weight loss

## 2022-05-05 NOTE — Assessment & Plan Note (Addendum)
Chronic Has lost 10 lb since Feb with decreasing intake Encouraged further weight loss Continue decrease portions Advised diet high in protein and vegetables, low in sugars and carbs Continue to be active

## 2022-05-05 NOTE — Assessment & Plan Note (Signed)
Chronic Has not tolerated statin secondary to myalgia Regular exercise and healthy diet encouraged Check lipid panel  Continue Zetia 10 mg daily

## 2022-05-05 NOTE — Assessment & Plan Note (Signed)
Chronic Check testosterone, CBC PSA 6 months ago normal Continue testosterone 4 pumps daily

## 2022-05-07 MED ORDER — LEVOTHYROXINE SODIUM 200 MCG PO TABS: 200.0000 ug | ORAL_TABLET | Freq: Every day | ORAL | 3 refills | Status: DC

## 2022-05-07 NOTE — Addendum Note (Signed)
Addended by: [name] on: 05/07/2022 07:46 AM   Modules accepted: Orders

## 2022-05-09 LAB — TESTOSTERONE, FREE & TOTAL
Free Testosterone: 164 pg/mL — ABNORMAL HIGH (ref 35.0–155.0)
Testosterone, Total, LC-MS-MS: 836 ng/dL (ref 250–1100)

## 2022-05-16 ENCOUNTER — Other Ambulatory Visit: Payer: Self-pay

## 2022-05-21 ENCOUNTER — Telehealth: Payer: Self-pay

## 2022-05-21 NOTE — Telephone Encounter (Signed)
Information given

## 2022-05-21 NOTE — Telephone Encounter (Signed)
Hold medication.  Monitor BP on a daily basis.  If BP is more than 140/90 start half a pill of lisinopril of 5 mg daily.

## 2022-05-21 NOTE — Telephone Encounter (Signed)
Pt reporting that his BP has been fluctuating and he is very concerned with this. Pt is requesting a call back as soon as possible. I offered to make pt appt and he states he doesn't want an appt; he could die while waiting on an appt.    I advised the pt that I would send an urgent message regarding his concern.  Pt didn't go into details just kept requesting the nurse call him back  Pt CB [phone]

## 2022-06-08 ENCOUNTER — Other Ambulatory Visit: Payer: Self-pay | Admitting: Internal Medicine

## 2022-06-12 ENCOUNTER — Ambulatory Visit (INDEPENDENT_AMBULATORY_CARE_PROVIDER_SITE_OTHER): Payer: Medicare HMO

## 2022-06-12 DIAGNOSIS — Z0000 Encounter for general adult medical examination without abnormal findings: Secondary | ICD-10-CM | POA: Diagnosis not present

## 2022-06-12 NOTE — Patient Instructions (Signed)
Charles [name] , Thank you for taking time to come for your Medicare Wellness Visit. I appreciate your ongoing commitment to your health goals. Please review the following plan we discussed and let me know if I can assist you in the future.   Screening recommendations/referrals: Colonoscopy: 05/19/2016; due every 5 years (please call [facility] to schedule) Recommended yearly ophthalmology/optometry visit for glaucoma screening and checkup Recommended yearly dental visit for hygiene and checkup  Vaccinations: Influenza vaccine: 09/06/2021 Pneumococcal vaccine: 01/07/2019, 01/09/2020 Tdap vaccine: 01/21/2020; due every 10 years Shingles vaccine: due   Covid-19: 11/14/2019, 12/05/2019, 08/07/2020, 08/30/2021  Advanced directives: No  Conditions/risks identified: Yes  Next appointment: Please schedule your next Medicare Wellness Visit with your Nurse Health Advisor in 1 year by calling [phone].  Preventive Care 76 Years and Older, Male Preventive care refers to lifestyle choices and visits with your health care provider that can promote health and wellness. What does preventive care include? A yearly physical exam. This is also called an annual well check. Dental exams once or twice a year. Routine eye exams. Ask your health care provider how often you should have your eyes checked. Personal lifestyle choices, including: Daily care of your teeth and gums. Regular physical activity. Eating a healthy diet. Avoiding tobacco and drug use. Limiting alcohol use. Practicing safe sex. Taking low doses of aspirin every day. Taking vitamin and mineral supplements as recommended by your health care provider. What happens during an annual well check? The services and screenings done by your health care provider during your annual well check will depend on your age, overall health, lifestyle risk factors, and family history of disease. Counseling  Your health care provider may ask you questions  about your: Alcohol use. Tobacco use. Drug use. Emotional well-being. Home and relationship well-being. Sexual activity. Eating habits. History of falls. Memory and ability to understand (cognition). Work and work Statistician. Screening  You may have the following tests or measurements: Height, weight, and BMI. Blood pressure. Lipid and cholesterol levels. These may be checked every 5 years, or more frequently if you are over 36 years old. Skin check. Lung cancer screening. You may have this screening every year starting at age 61 if you have a 30-pack-year history of smoking and currently smoke or have quit within the past 15 years. Fecal occult blood test (FOBT) of the stool. You may have this test every year starting at age 43. Flexible sigmoidoscopy or colonoscopy. You may have a sigmoidoscopy every 5 years or a colonoscopy every 10 years starting at age 42. Prostate cancer screening. Recommendations will vary depending on your family history and other risks. Hepatitis C blood test. Hepatitis B blood test. Sexually transmitted disease (STD) testing. Diabetes screening. This is done by checking your blood sugar (glucose) after you have not eaten for a while (fasting). You may have this done every 1-3 years. Abdominal aortic aneurysm (AAA) screening. You may need this if you are a current or former smoker. Osteoporosis. You may be screened starting at age 37 if you are at high risk. Talk with your health care provider about your test results, treatment options, and if necessary, the need for more tests. Vaccines  Your health care provider may recommend certain vaccines, such as: Influenza vaccine. This is recommended every year. Tetanus, diphtheria, and acellular pertussis (Tdap, Td) vaccine. You may need a Td booster every 10 years. Zoster vaccine. You may need this after age 33. Pneumococcal 13-valent conjugate (PCV13) vaccine. One dose is recommended after  age 20. Pneumococcal  polysaccharide (PPSV23) vaccine. One dose is recommended after age 7. Talk to your health care provider about which screenings and vaccines you need and how often you need them. This information is not intended to replace advice given to you by your health care provider. Make sure you discuss any questions you have with your health care provider. Document Released: 11/02/2015 Document Revised: 06/25/2016 Document Reviewed: 08/07/2015 Elsevier Interactive Patient Education  2017 [employer] Prevention in the Home Falls can cause injuries. They can happen to people of all ages. There are many things you can do to make your home safe and to help prevent falls. What can I do on the outside of my home? Regularly fix the edges of walkways and driveways and fix any cracks. Remove anything that might make you trip as you walk through a door, such as a raised step or threshold. Trim any bushes or trees on the path to your home. Use bright outdoor lighting. Clear any walking paths of anything that might make someone trip, such as rocks or tools. Regularly check to see if handrails are loose or broken. Make sure that both sides of any steps have handrails. Any raised decks and porches should have guardrails on the edges. Have any leaves, snow, or ice cleared regularly. Use sand or salt on walking paths during winter. Clean up any spills in your garage right away. This includes oil or grease spills. What can I do in the bathroom? Use night lights. Install grab bars by the toilet and in the tub and shower. Do not use towel bars as grab bars. Use non-skid mats or decals in the tub or shower. If you need to sit down in the shower, use a plastic, non-slip stool. Keep the floor dry. Clean up any water that spills on the floor as soon as it happens. Remove soap buildup in the tub or shower regularly. Attach bath mats securely with double-sided non-slip rug tape. Do not have throw rugs and other  things on the floor that can make you trip. What can I do in the bedroom? Use night lights. Make sure that you have a light by your bed that is easy to reach. Do not use any sheets or blankets that are too big for your bed. They should not hang down onto the floor. Have a firm chair that has side arms. You can use this for support while you get dressed. Do not have throw rugs and other things on the floor that can make you trip. What can I do in the kitchen? Clean up any spills right away. Avoid walking on wet floors. Keep items that you use a lot in easy-to-reach places. If you need to reach something above you, use a strong step stool that has a grab bar. Keep electrical cords out of the way. Do not use floor polish or wax that makes floors slippery. If you must use wax, use non-skid floor wax. Do not have throw rugs and other things on the floor that can make you trip. What can I do with my stairs? Do not leave any items on the stairs. Make sure that there are handrails on both sides of the stairs and use them. Fix handrails that are broken or loose. Make sure that handrails are as long as the stairways. Check any carpeting to make sure that it is firmly attached to the stairs. Fix any carpet that is loose or worn. Avoid having throw rugs  at the top or bottom of the stairs. If you do have throw rugs, attach them to the floor with carpet tape. Make sure that you have a light switch at the top of the stairs and the bottom of the stairs. If you do not have them, ask someone to add them for you. What else can I do to help prevent falls? Wear shoes that: Do not have high heels. Have rubber bottoms. Are comfortable and fit you well. Are closed at the toe. Do not wear sandals. If you use a stepladder: Make sure that it is fully opened. Do not climb a closed stepladder. Make sure that both sides of the stepladder are locked into place. Ask someone to hold it for you, if possible. Clearly  mark and make sure that you can see: Any grab bars or handrails. First and last steps. Where the edge of each step is. Use tools that help you move around (mobility aids) if they are needed. These include: Canes. Walkers. Scooters. Crutches. Turn on the lights when you go into a dark area. Replace any light bulbs as soon as they burn out. Set up your furniture so you have a clear path. Avoid moving your furniture around. If any of your floors are uneven, fix them. If there are any pets around you, be aware of where they are. Review your medicines with your doctor. Some medicines can make you feel dizzy. This can increase your chance of falling. Ask your doctor what other things that you can do to help prevent falls. This information is not intended to replace advice given to you by your health care provider. Make sure you discuss any questions you have with your health care provider. Document Released: 08/02/2009 Document Revised: 03/13/2016 Document Reviewed: 11/10/2014 Elsevier Interactive Patient Education  2017 [employer].
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
## 2022-07-13 ENCOUNTER — Other Ambulatory Visit: Payer: Self-pay | Admitting: Internal Medicine

## 2022-07-30 ENCOUNTER — Other Ambulatory Visit: Payer: Self-pay | Admitting: Internal Medicine

## 2022-08-12 ENCOUNTER — Ambulatory Visit: Payer: Self-pay

## 2022-08-12 ENCOUNTER — Encounter: Payer: Self-pay | Admitting: Family Medicine

## 2022-08-12 ENCOUNTER — Ambulatory Visit: Payer: Medicare HMO | Admitting: Family Medicine

## 2022-08-12 VITALS — BP 132/102 | HR 73 | Ht 71.0 in | Wt 267.0 lb

## 2022-08-12 DIAGNOSIS — M25511 Pain in right shoulder: Secondary | ICD-10-CM

## 2022-08-12 DIAGNOSIS — M19011 Primary osteoarthritis, right shoulder: Secondary | ICD-10-CM

## 2022-08-12 DIAGNOSIS — G8929 Other chronic pain: Secondary | ICD-10-CM

## 2022-08-12 DIAGNOSIS — M67919 Unspecified disorder of synovium and tendon, unspecified shoulder: Secondary | ICD-10-CM | POA: Diagnosis not present

## 2022-08-12 DIAGNOSIS — M19012 Primary osteoarthritis, left shoulder: Secondary | ICD-10-CM | POA: Diagnosis not present

## 2022-08-12 DIAGNOSIS — M12811 Other specific arthropathies, not elsewhere classified, right shoulder: Secondary | ICD-10-CM

## 2022-08-12 DIAGNOSIS — M25512 Pain in left shoulder: Secondary | ICD-10-CM | POA: Diagnosis not present

## 2022-08-12 DIAGNOSIS — M12812 Other specific arthropathies, not elsewhere classified, left shoulder: Secondary | ICD-10-CM | POA: Diagnosis not present

## 2022-08-12 NOTE — Assessment & Plan Note (Signed)
Severe arthritic changes with severe swelling of the acromioclavicular joints again.  Discussed with patient about icing regimen and home exercises, which activities to do and which ones to avoid.  Increase activity slowly over the course the next several weeks.  We discussed different range of motion.  Patient does do a lot of manual activity that likely contributes to some more of the discomfort and pain as well.  Follow-up with me again in 6 to 8 weeks

## 2022-08-12 NOTE — Assessment & Plan Note (Signed)
Worsening discomfort as well.  Been nearly 1 year since we have done this.  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  We discussed different treatment options and patient still wants to avoid any surgical intervention.  Has done relatively well for over a year and still does not have any worsening weakness.  Patient does respond well to the injections.  Discussed topical anti-inflammatories.  Discussed the importance of taking the allopurinol for the other underlying inflammatory things such as his gout.

## 2022-08-12 NOTE — Patient Instructions (Addendum)
Injected both AC and shoulder joints See me again in 2-3 months
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
## 2022-08-31 ENCOUNTER — Other Ambulatory Visit: Payer: Self-pay | Admitting: Internal Medicine

## 2022-09-06 ENCOUNTER — Other Ambulatory Visit: Payer: Self-pay | Admitting: Internal Medicine

## 2022-09-15 ENCOUNTER — Telehealth: Payer: Self-pay | Admitting: Family Medicine

## 2022-09-15 MED ORDER — ALLOPURINOL 100 MG PO TABS: 200.0000 mg | ORAL_TABLET | Freq: Every day | ORAL | 1 refills | Status: DC

## 2022-09-15 NOTE — Telephone Encounter (Signed)
Patient called asking for a refill on allopurinol (ZYLOPRIM) 100 MG tablet.  He said that he is needing it refilled today so that it can be in his system. He has to work tomorrow and is in a lot pain with trouble walking.  Please advise.

## 2022-09-15 NOTE — Telephone Encounter (Signed)
Refill sent in to pharmacy 

## 2022-09-15 NOTE — Telephone Encounter (Signed)
Pt called again about refill, reminded him provider out of office and requested he be patient and await our response.

## 2022-10-10 ENCOUNTER — Other Ambulatory Visit: Payer: Self-pay | Admitting: Internal Medicine

## 2022-10-21 ENCOUNTER — Telehealth: Payer: Self-pay | Admitting: Internal Medicine

## 2022-10-21 MED ORDER — TESTOSTERONE 20.25 MG/ACT (1.62%) TD GEL: TRANSDERMAL | 0 refills | Status: DC

## 2022-10-21 NOTE — Telephone Encounter (Signed)
Caller & Relationship to patient: PT  Call back number: [phone]  Date of last office visit: 05/05/2022  Date of next office visit: 10/29/2022  Medication(s) to be refilled:  Testosterone 20.25 MG/ACT (1.62%) GEL  Preferred Pharmacy:   [id number]- [place], [place]

## 2022-10-29 ENCOUNTER — Encounter: Payer: Medicare HMO | Admitting: Internal Medicine

## 2022-11-16 ENCOUNTER — Encounter: Payer: Self-pay | Admitting: Internal Medicine

## 2022-11-16 NOTE — Progress Notes (Unsigned)
Subjective:    Patient ID: Charles [name], male    DOB: 04-Nov-1950, 72 y.o.   MRN: [id number]     HPI Kerron is here for a physical exam.   Stopped lisinopril - BP was too low and he felt terrible.  BP has been ok at home - overall controlled.    He is currently paying for his AndroGel-is not covered by insurance.  He typically does 4 pumps every other day.  This will last him 1 month.  Medication makes him feel better which is why he is willing to pay for it, but it is expensive.  Wonders if there is any other options.  Medications and allergies reviewed with patient and updated if appropriate.  Current Outpatient Medications on File Prior to Visit  Medication Sig Dispense Refill   allopurinol (ZYLOPRIM) 100 MG tablet Take 2 tablets (200 mg total) by mouth daily. 180 tablet 1   ezetimibe (ZETIA) 10 MG tablet TAKE 1 TABLET BY MOUTH EVERY DAY 90 tablet 3   levothyroxine (SYNTHROID) 200 MCG tablet Take 1 tablet (200 mcg total) by mouth daily. 90 tablet 3   omeprazole (PRILOSEC) 20 MG capsule Take 20 mg by mouth daily.     tadalafil (CIALIS) 20 MG tablet TAKE ONE HALF OF TABLET TO ONE TABLET EVERY 3 DAYS AS NEEDED. (Patient taking differently: Take 10-20 mg by mouth daily as needed for erectile dysfunction.) 10 tablet 8   No current facility-administered medications on file prior to visit.    Review of Systems  Constitutional:  Negative for fever.  Eyes:  Negative for visual disturbance.  Respiratory:  Negative for cough, shortness of breath and wheezing.   Cardiovascular:  Negative for chest pain, palpitations and leg swelling.  Gastrointestinal:  Negative for abdominal pain, blood in stool, constipation and diarrhea.       No gerd  Genitourinary:  Negative for difficulty urinating, dysuria and hematuria.  Musculoskeletal:  Positive for arthralgias (shoulders, knees, hands). Negative for back pain.  Skin:  Negative for rash.  Neurological:  Negative for light-headedness  and headaches.  Psychiatric/Behavioral:  Negative for dysphoric mood. The patient is not nervous/anxious.        Objective:   Vitals:   11/17/22 0904  BP: 132/78  Pulse: 60  Temp: 98.6 F (37 C)  SpO2: 97%   Filed Weights   11/17/22 0904  Weight: 276 lb (125.2 kg)   Body mass index is 38.49 kg/m.  BP Readings from Last 3 Encounters:  11/17/22 132/78  08/12/22 (!) 132/102  05/05/22 108/78    Wt Readings from Last 3 Encounters:  11/17/22 276 lb (125.2 kg)  08/12/22 267 lb (121.1 kg)  05/05/22 267 lb (121.1 kg)      Physical Exam Constitutional: He appears well-developed and well-nourished. No distress.  HENT:  Head: Normocephalic and atraumatic.  Right Ear: External ear normal.  Left Ear: External ear normal.  Mouth/Throat: Oropharynx is clear and moist.  Normal ear canals and TM b/l  Eyes: Conjunctivae and EOM are normal.  Neck: Neck supple. No tracheal deviation present. No thyromegaly present.  No carotid bruit  Cardiovascular: Normal rate, regular rhythm, normal heart sounds and intact distal pulses.   No murmur heard. Pulmonary/Chest: Effort normal and breath sounds normal. No respiratory distress. He has no wheezes. He has no rales.  Abdominal: Soft. He exhibits no distension. Ventral hernia.There is no tenderness.  Genitourinary: deferred  Musculoskeletal: He exhibits no edema.  Lymphadenopathy:   He  has no cervical adenopathy.  Skin: Skin is warm and dry. He is not diaphoretic.  Psychiatric: He has a normal mood and affect. His behavior is normal.         Assessment & Plan:   Physical exam: Screening blood work  ordered Exercise   some - should do more Weight encouraged weight loss Substance abuse   none   Reviewed recommended immunizations.   Health Maintenance  Topic Date Due   COLONOSCOPY (Pts 45-28yr Insurance coverage will need to be confirmed)  05/19/2021   COVID-19 Vaccine (5 - 2023-24 season) 12/03/2022 (Originally 06/20/2022)    Zoster Vaccines- Shingrix (1 of 2) 02/16/2023 (Originally 08/20/2002)   Medicare Annual Wellness (AWV)  06/13/2023   DTaP/Tdap/Td (3 - Td or Tdap) 01/20/2030   Pneumonia Vaccine 72 Years old  Completed   INFLUENZA VACCINE  Completed   Hepatitis C Screening  Completed   HPV VACCINES  Aged Out   He knows he is due for colonoscopy - Will schedule that.    See Problem List for Assessment and Plan of chronic medical problems.

## 2022-11-16 NOTE — Patient Instructions (Addendum)
BP goal - < 140/90   Blood work was ordered.   The lab is on the first floor.    Medications changes include :   None   A referral for urology was ordered.  They will call you to schedule.     Return in about 6 months (around 05/18/2023) for follow up.   Health Maintenance, Male Adopting a healthy lifestyle and getting preventive care are important in promoting health and wellness. Ask your health care provider about: The right schedule for you to have regular tests and exams. Things you can do on your own to prevent diseases and keep yourself healthy. What should I know about diet, weight, and exercise? Eat a healthy diet  Eat a diet that includes plenty of vegetables, fruits, low-fat dairy products, and lean protein. Do not eat a lot of foods that are high in solid fats, added sugars, or sodium. Maintain a healthy weight Body mass index (BMI) is a measurement that can be used to identify possible weight problems. It estimates body fat based on height and weight. Your health care provider can help determine your BMI and help you achieve or maintain a healthy weight. Get regular exercise Get regular exercise. This is one of the most important things you can do for your health. Most adults should: Exercise for at least 150 minutes each week. The exercise should increase your heart rate and make you sweat (moderate-intensity exercise). Do strengthening exercises at least twice a week. This is in addition to the moderate-intensity exercise. Spend less time sitting. Even light physical activity can be beneficial. Watch cholesterol and blood lipids Have your blood tested for lipids and cholesterol at 71 years of age, then have this test every 5 years. You may need to have your cholesterol levels checked more often if: Your lipid or cholesterol levels are high. You are older than 71 years of age. You are at high risk for heart disease. What should I know about cancer  screening? Many types of cancers can be detected early and may often be prevented. Depending on your health history and family history, you may need to have cancer screening at various ages. This may include screening for: Colorectal cancer. Prostate cancer. Skin cancer. Lung cancer. What should I know about heart disease, diabetes, and high blood pressure? Blood pressure and heart disease High blood pressure causes heart disease and increases the risk of stroke. This is more likely to develop in people who have high blood pressure readings or are overweight. Talk with your health care provider about your target blood pressure readings. Have your blood pressure checked: Every 3-5 years if you are 76-18 years of age. Every year if you are 47 years old or older. If you are between the ages of 18 and 66 and are a current or former smoker, ask your health care provider if you should have a one-time screening for abdominal aortic aneurysm (AAA). Diabetes Have regular diabetes screenings. This checks your fasting blood sugar level. Have the screening done: Once every three years after age 17 if you are at a normal weight and have a low risk for diabetes. More often and at a younger age if you are overweight or have a high risk for diabetes. What should I know about preventing infection? Hepatitis B If you have a higher risk for hepatitis B, you should be screened for this virus. Talk with your health care provider to find out if you are at risk  for hepatitis B infection. Hepatitis C Blood testing is recommended for: Everyone born from [birth year] through 1964. Anyone with known risk factors for hepatitis C. Sexually transmitted infections (STIs) You should be screened each year for STIs, including gonorrhea and chlamydia, if: You are sexually active and are younger than 71 years of age. You are older than 71 years of age and your health care provider tells you that you are at risk for this type of  infection. Your sexual activity has changed since you were last screened, and you are at increased risk for chlamydia or gonorrhea. Ask your health care provider if you are at risk. Ask your health care provider about whether you are at high risk for HIV. Your health care provider may recommend a prescription medicine to help prevent HIV infection. If you choose to take medicine to prevent HIV, you should first get tested for HIV. You should then be tested every 3 months for as long as you are taking the medicine. Follow these instructions at home: Alcohol use Do not drink alcohol if your health care provider tells you not to drink. If you drink alcohol: Limit how much you have to 0-2 drinks a day. Know how much alcohol is in your drink. In the [place] one drink equals one 12 oz bottle of beer (355 mL), one 5 oz glass of wine (148 mL), or one 1 oz glass of hard liquor (44 mL). Lifestyle Do not use any products that contain nicotine or tobacco. These products include cigarettes, chewing tobacco, and vaping devices, such as e-cigarettes. If you need help quitting, ask your health care provider. Do not use street drugs. Do not share needles. Ask your health care provider for help if you need support or information about quitting drugs. General instructions Schedule regular health, dental, and eye exams. Stay current with your vaccines. Tell your health care provider if: You often feel depressed. You have ever been abused or do not feel safe at home. Summary Adopting a healthy lifestyle and getting preventive care are important in promoting health and wellness. Follow your health care provider's instructions about healthy diet, exercising, and getting tested or screened for diseases. Follow your health care provider's instructions on monitoring your cholesterol and blood pressure. This information is not intended to replace advice given to you by your health care provider. Make sure you discuss  any questions you have with your health care provider. Document Revised: 02/25/2021 Document Reviewed: 02/25/2021 Elsevier Patient Education  2023 [employer].

## 2022-11-17 ENCOUNTER — Ambulatory Visit (INDEPENDENT_AMBULATORY_CARE_PROVIDER_SITE_OTHER): Payer: Medicare HMO | Admitting: Internal Medicine

## 2022-11-17 ENCOUNTER — Other Ambulatory Visit: Payer: Self-pay | Admitting: Internal Medicine

## 2022-11-17 VITALS — BP 132/78 | HR 60 | Temp 98.6°F | Ht 71.0 in | Wt 276.0 lb

## 2022-11-17 DIAGNOSIS — M10079 Idiopathic gout, unspecified ankle and foot: Secondary | ICD-10-CM | POA: Diagnosis not present

## 2022-11-17 DIAGNOSIS — R7303 Prediabetes: Secondary | ICD-10-CM | POA: Diagnosis not present

## 2022-11-17 DIAGNOSIS — M159 Polyosteoarthritis, unspecified: Secondary | ICD-10-CM | POA: Diagnosis not present

## 2022-11-17 DIAGNOSIS — E349 Endocrine disorder, unspecified: Secondary | ICD-10-CM

## 2022-11-17 DIAGNOSIS — E038 Other specified hypothyroidism: Secondary | ICD-10-CM

## 2022-11-17 DIAGNOSIS — Z125 Encounter for screening for malignant neoplasm of prostate: Secondary | ICD-10-CM | POA: Diagnosis not present

## 2022-11-17 DIAGNOSIS — E782 Mixed hyperlipidemia: Secondary | ICD-10-CM | POA: Diagnosis not present

## 2022-11-17 DIAGNOSIS — M1990 Unspecified osteoarthritis, unspecified site: Secondary | ICD-10-CM | POA: Insufficient documentation

## 2022-11-17 DIAGNOSIS — Z0000 Encounter for general adult medical examination without abnormal findings: Secondary | ICD-10-CM

## 2022-11-17 DIAGNOSIS — I10 Essential (primary) hypertension: Secondary | ICD-10-CM | POA: Diagnosis not present

## 2022-11-17 DIAGNOSIS — M109 Gout, unspecified: Secondary | ICD-10-CM | POA: Insufficient documentation

## 2022-11-17 LAB — CBC WITH DIFFERENTIAL/PLATELET
Basophils Absolute: 0.1 10*3/uL (ref 0.0–0.1)
Basophils Relative: 1.1 % (ref 0.0–3.0)
Eosinophils Absolute: 0.5 10*3/uL (ref 0.0–0.7)
Eosinophils Relative: 5.5 % — ABNORMAL HIGH (ref 0.0–5.0)
HCT: 47.8 % (ref 39.0–52.0)
Hemoglobin: 16.8 g/dL (ref 13.0–17.0)
Lymphocytes Relative: 29.1 % (ref 12.0–46.0)
Lymphs Abs: 2.6 10*3/uL (ref 0.7–4.0)
MCHC: 35.2 g/dL (ref 30.0–36.0)
MCV: 91.7 fl (ref 78.0–100.0)
Monocytes Absolute: 0.7 10*3/uL (ref 0.1–1.0)
Monocytes Relative: 8 % (ref 3.0–12.0)
Neutro Abs: 5.1 10*3/uL (ref 1.4–7.7)
Neutrophils Relative %: 56.3 % (ref 43.0–77.0)
Platelets: 250 10*3/uL (ref 150.0–400.0)
RBC: 5.21 Mil/uL (ref 4.22–5.81)
RDW: 14.8 % (ref 11.5–15.5)
WBC: 9 10*3/uL (ref 4.0–10.5)

## 2022-11-17 LAB — LIPID PANEL
Cholesterol: 163 mg/dL (ref 0–200)
HDL: 37.9 mg/dL — ABNORMAL LOW (ref >39.00)
LDL Cholesterol: 94 mg/dL (ref 0–99)
NonHDL: 124.97
Total CHOL/HDL Ratio: 4
Triglycerides: 155 mg/dL — ABNORMAL HIGH (ref 0.0–149.0)
VLDL: 31 mg/dL (ref 0.0–40.0)

## 2022-11-17 LAB — COMPREHENSIVE METABOLIC PANEL
ALT: 31 U/L (ref 0–53)
AST: 33 U/L (ref 0–37)
Albumin: 4.5 g/dL (ref 3.5–5.2)
Alkaline Phosphatase: 71 U/L (ref 39–117)
BUN: 13 mg/dL (ref 6–23)
CO2: 22 mEq/L (ref 19–32)
Calcium: 9.5 mg/dL (ref 8.4–10.5)
Chloride: 103 mEq/L (ref 96–112)
Creatinine, Ser: 1.2 mg/dL (ref 0.40–1.50)
GFR: 61.39 mL/min (ref >60.00)
Glucose, Bld: 90 mg/dL (ref 70–99)
Potassium: 4.4 mEq/L (ref 3.5–5.1)
Sodium: 138 mEq/L (ref 135–145)
Total Bilirubin: 0.5 mg/dL (ref 0.2–1.2)
Total Protein: 7.4 g/dL (ref 6.0–8.3)

## 2022-11-17 LAB — URIC ACID: Uric Acid, Serum: 5.7 mg/dL (ref 4.0–7.8)

## 2022-11-17 LAB — PSA, MEDICARE: PSA: 0.4 ng/ml (ref 0.10–4.00)

## 2022-11-17 LAB — HEMOGLOBIN A1C: Hgb A1c MFr Bld: 6.2 % (ref 4.6–6.5)

## 2022-11-17 LAB — TSH: TSH: 8.75 u[IU]/mL — ABNORMAL HIGH (ref 0.35–5.50)

## 2022-11-17 MED ORDER — TESTOSTERONE 20.25 MG/ACT (1.62%) TD GEL: TRANSDERMAL | 2 refills | Status: DC

## 2022-11-17 NOTE — Assessment & Plan Note (Signed)
Chronic Check a1c Low sugar / carb diet Stressed regular exercise   

## 2022-11-17 NOTE — Assessment & Plan Note (Signed)
Chronic  Clinically euthyroid Check tsh and will titrate med dose if needed Currently taking levothyroxine 200 mcg daily

## 2022-11-17 NOTE — Assessment & Plan Note (Signed)
Chronic Hands, knees, shoulders Takes ibuprofen as needed-not on a daily basis-discussed not to take this too much can injure his kidneys

## 2022-11-17 NOTE — Assessment & Plan Note (Signed)
Chronic Statin intolerant-myalgias-Lipitor and Crestor Regular exercise and healthy diet encouraged Check lipid panel  Continue Zetia 10 mg daily

## 2022-11-17 NOTE — Assessment & Plan Note (Addendum)
Chronic Has had about 3 gout flares Taking allopurinol 200 mg daily Check uric acid level

## 2022-11-17 NOTE — Assessment & Plan Note (Addendum)
Chronic Doing well with testosterone replacement Check testosterone, CBC, PSA Continue testosterone 4 pumps daily-he is currently paying for this out-of-pocket because is not covered by insurance Discussed other options such as monthly injections, pellet implantation-not sure what the best option for him economically and based on how he wants to feel-will refer to urology

## 2022-11-17 NOTE — Assessment & Plan Note (Addendum)
Chronic BP was running low at home - stopped lisinopril completely Blood pressure well controlled here and at home CMP Discussed BP goal

## 2022-11-21 LAB — TESTOSTERONE, FREE & TOTAL
Free Testosterone: 52.5 pg/mL (ref 30.0–135.0)
Testosterone, Total, LC-MS-MS: 328 ng/dL (ref 250–1100)

## 2022-11-22 MED ORDER — LEVOTHYROXINE SODIUM 25 MCG PO TABS: 25.0000 ug | ORAL_TABLET | Freq: Every day | ORAL | 3 refills | Status: DC

## 2022-11-22 NOTE — Addendum Note (Signed)
Addended by: [name] on: 11/22/2022 05:34 PM   Modules accepted: Orders

## 2022-12-30 ENCOUNTER — Telehealth: Payer: Self-pay | Admitting: Internal Medicine

## 2022-12-30 ENCOUNTER — Telehealth: Payer: Self-pay | Admitting: Family Medicine

## 2022-12-30 ENCOUNTER — Other Ambulatory Visit: Payer: Self-pay

## 2022-12-30 MED ORDER — TESTOSTERONE 20.25 MG/ACT (1.62%) TD GEL: TRANSDERMAL | 2 refills | Status: DC

## 2022-12-30 MED ORDER — PREDNISONE 20 MG PO TABS: 40.0000 mg | ORAL_TABLET | Freq: Every day | ORAL | 0 refills | Status: DC

## 2022-12-30 NOTE — Telephone Encounter (Signed)
Pt having a gout flare up in L foot. Has been taking the allupurinol as directed for weeks. It helped the R foot, but the L is swollen and red.  Pt asking if another or different med might help.

## 2022-12-30 NOTE — Telephone Encounter (Signed)
Prescription Request  12/30/2022  LOV: 11/17/2022  What is the name of the medication or equipment?  Testosterone 20.25 MG/ACT (1.62%) GEL  Have you contacted your pharmacy to request a refill? Yes   Which pharmacy would you like this sent to?  [id number] [id number]: [phone]: [phone]   Patient notified that their request is being sent to the clinical staff for review and that they should receive a response within 2 business days.   Please advise at Mobile [phone](mobile)

## 2022-12-30 NOTE — Telephone Encounter (Signed)
Rx filled and patient notified. 

## 2023-01-15 ENCOUNTER — Encounter (HOSPITAL_COMMUNITY): Payer: Self-pay

## 2023-01-15 ENCOUNTER — Ambulatory Visit (INDEPENDENT_AMBULATORY_CARE_PROVIDER_SITE_OTHER): Payer: Medicare HMO

## 2023-01-15 ENCOUNTER — Ambulatory Visit (HOSPITAL_COMMUNITY)
Admission: EM | Admit: 2023-01-15 | Discharge: 2023-01-15 | Disposition: A | Discharge status: Home / Self Care | Payer: Medicare HMO | Attending: Emergency Medicine | Admitting: Emergency Medicine

## 2023-01-15 DIAGNOSIS — J189 Pneumonia, unspecified organism: Secondary | ICD-10-CM

## 2023-01-15 DIAGNOSIS — R0602 Shortness of breath: Secondary | ICD-10-CM | POA: Diagnosis not present

## 2023-01-15 DIAGNOSIS — R059 Cough, unspecified: Secondary | ICD-10-CM | POA: Diagnosis not present

## 2023-01-15 MED ORDER — IPRATROPIUM-ALBUTEROL 0.5-2.5 (3) MG/3ML IN SOLN
3.0000 mL | Freq: Once | RESPIRATORY_TRACT | Status: AC
  Administered 2023-01-15: 3 mL via RESPIRATORY_TRACT

## 2023-01-15 MED ORDER — METHYLPREDNISOLONE SODIUM SUCC 125 MG IJ SOLR
60.0000 mg | Freq: Once | INTRAMUSCULAR | Status: AC
  Administered 2023-01-15: 60 mg via INTRAMUSCULAR

## 2023-01-15 MED ORDER — DOXYCYCLINE HYCLATE 100 MG PO CAPS: 100.0000 mg | ORAL_CAPSULE | Freq: Two times a day (BID) | ORAL | 0 refills | Status: DC

## 2023-01-15 MED ORDER — BENZONATATE 100 MG PO CAPS: 100.0000 mg | ORAL_CAPSULE | Freq: Three times a day (TID) | ORAL | 0 refills | Status: DC | PRN

## 2023-01-15 MED ORDER — METHYLPREDNISOLONE SODIUM SUCC 125 MG IJ SOLR
INTRAMUSCULAR | Status: AC
  Filled 2023-01-15: qty 2

## 2023-01-15 MED ORDER — IPRATROPIUM-ALBUTEROL 0.5-2.5 (3) MG/3ML IN SOLN
RESPIRATORY_TRACT | Status: AC
  Filled 2023-01-15: qty 3

## 2023-01-15 NOTE — Discharge Instructions (Addendum)
I am treating you for pneumonia. Take the doxycycline as prescribed. Take with food to avoid upset stomach. I also recommend using the cough pills 3 times daily.  Please use caution as these may make you drowsy.  The injection given today should help reduce inflammation in the lungs and improve your shortness of breath  I recommend follow-up with your primary care soon as able. If symptoms worsen please go directly to the emergency department.

## 2023-01-15 NOTE — ED Triage Notes (Signed)
Patient c/o SOB,  a productive cough with green sputum, fever, and generalized body aches x 1 week. Paitent states he has been taking Dayquil and Advil. Patient took Advil 400 mg at 0400 today.
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
## 2023-01-16 ENCOUNTER — Other Ambulatory Visit: Payer: Self-pay | Admitting: Internal Medicine

## 2023-01-19 ENCOUNTER — Ambulatory Visit: Payer: Medicare HMO | Admitting: Family Medicine

## 2023-01-20 DIAGNOSIS — J189 Pneumonia, unspecified organism: Secondary | ICD-10-CM | POA: Insufficient documentation

## 2023-01-20 NOTE — Patient Instructions (Signed)
      Blood work was ordered.   The lab is on the first floor.    Medications changes include :       A referral was ordered for XXX.     Someone will call you to schedule an appointment.    No follow-ups on file.  

## 2023-01-20 NOTE — Progress Notes (Unsigned)
    Subjective:    Patient ID: Charles [name], male    DOB: July 11, 1951, 72 y.o.   MRN: [id number]      HPI Charles [name] is here for No chief complaint on file.    3/28 urgent care for productive cough, SOB, fever, body aches.  Received duoneb, solu-medrol.  CXR with possible PNA.  Rx'd doxycycline bid x 5 days, tessalon perles prn.        Medications and allergies reviewed with patient and updated if appropriate.  Current Outpatient Medications on File Prior to Visit  Medication Sig Dispense Refill   predniSONE (DELTASONE) 20 MG tablet Take 2 tablets (40 mg total) by mouth daily with breakfast. 10 tablet 0   allopurinol (ZYLOPRIM) 100 MG tablet Take 2 tablets (200 mg total) by mouth daily. 180 tablet 1   benzonatate (TESSALON) 100 MG capsule Take 1 capsule (100 mg total) by mouth 3 (three) times daily as needed for cough. 21 capsule 0   doxycycline (VIBRAMYCIN) 100 MG capsule Take 1 capsule (100 mg total) by mouth 2 (two) times daily for 5 days. 10 capsule 0   ezetimibe (ZETIA) 10 MG tablet TAKE 1 TABLET BY MOUTH EVERY DAY 90 tablet 3   levothyroxine (SYNTHROID) 200 MCG tablet Take 1 tablet (200 mcg total) by mouth daily. 90 tablet 3   levothyroxine (SYNTHROID) 25 MCG tablet Take 1 tablet (25 mcg total) by mouth daily. In addition to 200 mcg pill for total of 225 mcg daily 90 tablet 3   omeprazole (PRILOSEC) 20 MG capsule Take 20 mg by mouth daily.     tadalafil (CIALIS) 20 MG tablet TAKE ONE HALF OF TABLET TO ONE TABLET EVERY 3 DAYS AS NEEDED. (Patient taking differently: Take 10-20 mg by mouth daily as needed for erectile dysfunction.) 10 tablet 8   Testosterone 20.25 MG/ACT (1.62%) GEL APPLY 4 PUMPS DAILY TO UPPER ARM OR CHEST DAILY 150 g 2   No current facility-administered medications on file prior to visit.    Review of Systems     Objective:  There were no vitals filed for this visit. BP Readings from Last 3 Encounters:  01/15/23 (!) 192/93  11/17/22 132/78   08/12/22 (!) 132/102   Wt Readings from Last 3 Encounters:  11/17/22 276 lb (125.2 kg)  08/12/22 267 lb (121.1 kg)  05/05/22 267 lb (121.1 kg)   There is no height or weight on file to calculate BMI.    Physical Exam       DG Chest 2 View CLINICAL DATA:  Productive cough, shortness of breath  EXAM: CHEST - 2 VIEW  COMPARISON:  None Available.  FINDINGS: Transverse diameter of heart is slightly increased. There are no signs of pulmonary edema. There is no focal pulmonary consolidation. There is slight prominence of interstitial markings in right parahilar region and both lower lung fields. There is no pleural effusion or pneumothorax.  IMPRESSION: There are no signs of pulmonary edema or focal pulmonary consolidation. There is slight prominence of interstitial markings in right parahilar region and both lower lung fields suggesting minimal scarring or mild interstitial pneumonia. There is no pleural effusion or pneumothorax.  Electronically Signed   By: Elmer Picker M.D.   On: 01/15/2023 12:37    Assessment & Plan:    See Problem List for Assessment and Plan of chronic medical problems.

## 2023-01-21 ENCOUNTER — Ambulatory Visit (INDEPENDENT_AMBULATORY_CARE_PROVIDER_SITE_OTHER): Payer: Medicare HMO | Admitting: Internal Medicine

## 2023-01-21 ENCOUNTER — Other Ambulatory Visit: Payer: Self-pay

## 2023-01-21 ENCOUNTER — Encounter: Payer: Self-pay | Admitting: Internal Medicine

## 2023-01-21 ENCOUNTER — Other Ambulatory Visit: Payer: Self-pay | Admitting: Internal Medicine

## 2023-01-21 ENCOUNTER — Other Ambulatory Visit: Payer: Self-pay | Admitting: Family Medicine

## 2023-01-21 VITALS — BP 132/78 | HR 79 | Temp 98.9°F | Ht 71.0 in | Wt 261.0 lb

## 2023-01-21 DIAGNOSIS — I10 Essential (primary) hypertension: Secondary | ICD-10-CM | POA: Diagnosis not present

## 2023-01-21 DIAGNOSIS — J189 Pneumonia, unspecified organism: Secondary | ICD-10-CM

## 2023-01-21 MED ORDER — HYDROCODONE BIT-HOMATROP MBR 5-1.5 MG/5ML PO SOLN: 5.0000 mL | Freq: Three times a day (TID) | ORAL | 0 refills | Status: DC | PRN

## 2023-01-21 MED ORDER — DOXYCYCLINE HYCLATE 100 MG PO CAPS: 100.0000 mg | ORAL_CAPSULE | Freq: Two times a day (BID) | ORAL | 0 refills | Status: AC

## 2023-01-21 NOTE — Assessment & Plan Note (Addendum)
Chronic Blood pressure well controlled here and at home, but it sounds like he has occasionally high BP Advise keeping a log Discussed BP goal -continue to monitor at home

## 2023-01-21 NOTE — Assessment & Plan Note (Addendum)
Acute Was seen at urgent care - 75% better Still with significant symptoms - concern that he needs additional abx Start doxycycline 100 mg bid x 5 days Hycodon cough syrup for nighttime If not feeling better when completing the abx he will let me know - would consider cxr at that time

## 2023-01-29 ENCOUNTER — Telehealth: Payer: Self-pay | Admitting: Family Medicine

## 2023-01-29 ENCOUNTER — Other Ambulatory Visit: Payer: Self-pay

## 2023-01-29 MED ORDER — COLCHICINE 0.6 MG PO TABS: 0.6000 mg | ORAL_TABLET | Freq: Every day | ORAL | 0 refills | Status: DC

## 2023-01-29 NOTE — Telephone Encounter (Signed)
Spoke with patient per recommendation. Patient would like to try colchicine. Rx filled. Voices understanding to discontinue allopurinol.

## 2023-01-29 NOTE — Telephone Encounter (Signed)
Lets discontinue the allopurinol and spine start colchicine 0.6 mg daily if this would be better for the individual.  If not we can increase the allopurinol to the 300 mg capsules

## 2023-01-29 NOTE — Telephone Encounter (Signed)
Pt called, pharmacy will not fill the allopurinol we recently prescribed until May (??) as they say it is too soon.  Pt does not feel like it is really helping currently as his foot/toe is swollen and very painful and he has been taking the allopurinol twice per day.  Could we rx something else?

## 2023-02-18 ENCOUNTER — Telehealth: Payer: Self-pay | Admitting: Internal Medicine

## 2023-02-18 NOTE — Telephone Encounter (Signed)
Contacted [facility] to schedule their annual wellness visit. Appointment made for 03/10/2023.  [facility] Care Guide [facility] AWV TEAM Direct Dial: [phone]

## 2023-02-20 ENCOUNTER — Other Ambulatory Visit: Payer: Self-pay | Admitting: Family Medicine

## 2023-02-25 ENCOUNTER — Other Ambulatory Visit: Payer: Self-pay | Admitting: Internal Medicine

## 2023-02-25 ENCOUNTER — Other Ambulatory Visit: Payer: Self-pay | Admitting: Family Medicine

## 2023-03-20 ENCOUNTER — Other Ambulatory Visit: Payer: Self-pay | Admitting: Family Medicine

## 2023-04-02 ENCOUNTER — Other Ambulatory Visit: Payer: Self-pay | Admitting: Internal Medicine

## 2023-04-15 ENCOUNTER — Other Ambulatory Visit: Payer: Self-pay

## 2023-04-15 ENCOUNTER — Ambulatory Visit: Payer: Medicare HMO | Admitting: Family Medicine

## 2023-04-15 VITALS — BP 172/96 | HR 70 | Ht 71.0 in | Wt 271.0 lb

## 2023-04-15 DIAGNOSIS — G8929 Other chronic pain: Secondary | ICD-10-CM | POA: Diagnosis not present

## 2023-04-15 DIAGNOSIS — M19011 Primary osteoarthritis, right shoulder: Secondary | ICD-10-CM

## 2023-04-15 DIAGNOSIS — M19012 Primary osteoarthritis, left shoulder: Secondary | ICD-10-CM

## 2023-04-15 DIAGNOSIS — M25552 Pain in left hip: Secondary | ICD-10-CM

## 2023-04-15 DIAGNOSIS — M159 Polyosteoarthritis, unspecified: Secondary | ICD-10-CM

## 2023-04-15 DIAGNOSIS — M7061 Trochanteric bursitis, right hip: Secondary | ICD-10-CM

## 2023-04-15 DIAGNOSIS — M25551 Pain in right hip: Secondary | ICD-10-CM | POA: Diagnosis not present

## 2023-04-15 DIAGNOSIS — M7062 Trochanteric bursitis, left hip: Secondary | ICD-10-CM | POA: Diagnosis not present

## 2023-04-15 DIAGNOSIS — M25511 Pain in right shoulder: Secondary | ICD-10-CM

## 2023-04-15 DIAGNOSIS — M25512 Pain in left shoulder: Secondary | ICD-10-CM

## 2023-04-15 DIAGNOSIS — M12811 Other specific arthropathies, not elsewhere classified, right shoulder: Secondary | ICD-10-CM

## 2023-04-15 DIAGNOSIS — M150 Primary generalized (osteo)arthritis: Secondary | ICD-10-CM

## 2023-04-15 NOTE — Progress Notes (Signed)
Charles Payor, PhD, LAT, ATC acting as a scribe for Charles Graham, MD.  Charles [name] is a 72 y.o. male who presents to [facility] today for cont'd bilat shoulder and bilat hip pain. Pt was previously seen by Dr. Katrinka Blazing on 08/12/22 and was given a bilat GH and bilat AC joint steroid injections.  Today, pt reports prior GH steroid injections provided several months of relief. He is self-employed. He has been putting off a return visit.   He also c/o bilat hip pain ongoing for 1-2 years. Pt locates pain to the lateral aspect of the hip and into the groin. No radiating pain.  His right shoulder and right hip are his worst issues today.  Dx imaging: 06/04/21 R & L shoulder MRI  05/07/21 Bilat hips/pelvis & L-spine XR  03/12/21 R & L shoulder XR  01/22/21 Pelvis XR  Pertinent review of systems: No fevers or chills  Relevant historical information: Hypertension.  Patient works as a Actor.   Exam:  BP (!) 172/96   Pulse 70   Ht 5\' 11"  (1.803 m)   Wt 271 lb (122.9 kg)   SpO2 95%   BMI 37.80 kg/m  General: Well Developed, well nourished, and in no acute distress.   MSK: Right shoulder normal-appearing Normal motion pain with abduction. Tender palpation AC joint. Intact strength pain with resisted abduction. Positive Hawkins and Neer's test.  Positive crossover arm compression test.  Left shoulder: Normal-appearing. Normal motion pain with abduction. Tender palpation AC joint. Intact strength pain present with resisted abduction. Positive Hawkins and Neer's test.  Positive crossover arm compression test.  Right hip: Normal-appearing Decreased range of motion limited flexion and internal rotation. Tender palpation greater trochanter.  Reduced strength hip abduction.  Left hip: Normal-appearing Decreased range of motion limited flexion and internal rotation. Mildly tender palpation greater trochanter.   Lab and Radiology  Results  Procedure: Real-time Ultrasound Guided Injection of right shoulder subacromial bursa Device: Philips Affiniti 50G/GE Logiq Images permanently stored and available for review in PACS Verbal informed consent obtained.  Discussed risks and benefits of procedure. Warned about infection, bleeding, hyperglycemia damage to structures among others. Patient expresses understanding and agreement Time-out conducted.   Noted no overlying erythema, induration, or other signs of local infection.   Skin prepped in a sterile fashion.   Local anesthesia: Topical Ethyl chloride.   With sterile technique and under real time ultrasound guidance: 40 mg of Kenalog and 2 mL of Marcaine injected into subacromial bursa. Fluid seen entering the bursa.   Completed without difficulty   Pain moderately resolved suggesting accurate placement of the medication.   Advised to call if fevers/chills, erythema, induration, drainage, or persistent bleeding.   Images permanently stored and available for review in the ultrasound unit.  Impression: Technically successful ultrasound guided injection.    Procedure: Real-time Ultrasound Guided Injection of right lateral hip greater trochanter bursa Device: Philips Affiniti 50G/GE Logiq Images permanently stored and available for review in PACS Verbal informed consent obtained.  Discussed risks and benefits of procedure. Warned about infection, bleeding, hyperglycemia damage to structures among others. Patient expresses understanding and agreement Time-out conducted.   Noted no overlying erythema, induration, or other signs of local infection.   Skin prepped in a sterile fashion.   Local anesthesia: Topical Ethyl chloride.   With sterile technique and under real time ultrasound guidance: 40 mg of Kenalog and 2 mL of Marcaine injected into greater trochanter bursa. Fluid  seen entering the bursa.   Completed without difficulty   Pain moderately resolved suggesting  accurate placement of the medication.   Advised to call if fevers/chills, erythema, induration, drainage, or persistent bleeding.   Images permanently stored and available for review in the ultrasound unit.  Impression: Technically successful ultrasound guided injection.        Assessment and Plan: 72 y.o. male with bilateral shoulder and bilateral hip pain.  Pain is multifactorial.  Shoulder pain thought to be due to combination of subacromial bursitis and impingement and AC DJD.  Plan for right subacromial bursa injection today.  Could proceed with [facility] joint injection on the right side if needed.  Also can proceed with similar on the left side.  Bilateral hip pain multifactorial.  Some of the pain thought to be due to hip abductor tendinopathy and greater trochanteric bursitis.  Additionally has anterior hip pain suggestive of arthritis.  He did have some arthritis on prior x-ray.  Plan for greater trochanter bursa injection today.  Consider intra-articular injection if needed.  Referral to physical therapy today.  Return as soon as 1 week for continued injections.  Limiting injections to 2 per visit is safe for avoiding hyperglycemia.   PDMP not reviewed this encounter. Orders Placed This Encounter  Procedures   [place] LIMITED JOINT SPACE STRUCTURES LOW BILAT(NO LINKED CHARGES)    Order Specific Question:   Reason for Exam (SYMPTOM  OR DIAGNOSIS REQUIRED)    Answer:   bilateral hip pain    Order Specific Question:   Preferred imaging location?    Answer:   [facility] referral to Physical Therapy    Referral Priority:   Routine    Referral Type:   Physical Medicine    Referral Reason:   Specialty Services Required    Requested Specialty:   Physical Therapy    Number of Visits Requested:   1   No orders of the defined types were placed in this encounter.    Discussed warning signs or symptoms. Please see discharge instructions. Patient  expresses understanding.   The above documentation has been reviewed and is accurate and complete Charles [name], M.D.

## 2023-04-15 NOTE — Patient Instructions (Addendum)
Thank you for coming in today.   You received an injection today. Seek immediate medical attention if the joint becomes red, extremely painful, or is oozing fluid.   I've referred you to Physical Therapy.  Let us know if you don't hear from them in one week.   OK to reschedule next week for the other side injections if needed.

## 2023-04-28 DIAGNOSIS — M25552 Pain in left hip: Secondary | ICD-10-CM | POA: Diagnosis not present

## 2023-04-28 DIAGNOSIS — M6281 Muscle weakness (generalized): Secondary | ICD-10-CM | POA: Diagnosis not present

## 2023-04-28 DIAGNOSIS — M25551 Pain in right hip: Secondary | ICD-10-CM | POA: Diagnosis not present

## 2023-04-28 DIAGNOSIS — M25511 Pain in right shoulder: Secondary | ICD-10-CM | POA: Diagnosis not present

## 2023-04-28 DIAGNOSIS — M25512 Pain in left shoulder: Secondary | ICD-10-CM | POA: Diagnosis not present

## 2023-04-28 NOTE — Progress Notes (Unsigned)
   [name], PhD, LAT, ATC acting as a scribe for Charles Graham, MD.  [name] is a 72 y.o. male who presents to [facility] at [facility] today for f/u bilat shoulder and hip pain. Pt was last seen by Dr. Denyse Amass on 04/15/23 and was given a R subacromial and R lateral hip steroid injections and was advised to return in 1 wk to consider additional injections. He was also referred to [facility] PT.  Today, pt reports ***  Dx imaging: 06/04/21 R & L shoulder MRI             05/07/21 Bilat hips/pelvis & L-spine XR             03/12/21 R & L shoulder XR             01/22/21 Pelvis XR  Pertinent review of systems: ***  Relevant historical information: ***   Exam:  There were no vitals taken for this visit. General: Well Developed, well nourished, and in no acute distress.   MSK: ***    Lab and Radiology Results No results found for this or any previous visit (from the past 72 hour(s)). No results found.     Assessment and Plan: 72 y.o. male with ***   PDMP not reviewed this encounter. No orders of the defined types were placed in this encounter.  No orders of the defined types were placed in this encounter.    Discussed warning signs or symptoms. Please see discharge instructions. Patient expresses understanding.   ***

## 2023-04-29 ENCOUNTER — Encounter: Payer: Self-pay | Admitting: Family Medicine

## 2023-04-29 ENCOUNTER — Ambulatory Visit: Payer: Medicare HMO | Admitting: Family Medicine

## 2023-04-29 ENCOUNTER — Other Ambulatory Visit: Payer: Self-pay

## 2023-04-29 VITALS — BP 130/84 | HR 58 | Ht 71.0 in | Wt 261.0 lb

## 2023-04-29 DIAGNOSIS — G8929 Other chronic pain: Secondary | ICD-10-CM | POA: Diagnosis not present

## 2023-04-29 DIAGNOSIS — M7061 Trochanteric bursitis, right hip: Secondary | ICD-10-CM | POA: Diagnosis not present

## 2023-04-29 DIAGNOSIS — M67919 Unspecified disorder of synovium and tendon, unspecified shoulder: Secondary | ICD-10-CM | POA: Diagnosis not present

## 2023-04-29 DIAGNOSIS — M12811 Other specific arthropathies, not elsewhere classified, right shoulder: Secondary | ICD-10-CM

## 2023-04-29 DIAGNOSIS — M19011 Primary osteoarthritis, right shoulder: Secondary | ICD-10-CM | POA: Diagnosis not present

## 2023-04-29 DIAGNOSIS — M7062 Trochanteric bursitis, left hip: Secondary | ICD-10-CM | POA: Diagnosis not present

## 2023-04-29 DIAGNOSIS — M25511 Pain in right shoulder: Secondary | ICD-10-CM | POA: Diagnosis not present

## 2023-04-29 DIAGNOSIS — M25512 Pain in left shoulder: Secondary | ICD-10-CM

## 2023-04-29 DIAGNOSIS — M19012 Primary osteoarthritis, left shoulder: Secondary | ICD-10-CM

## 2023-04-29 NOTE — Patient Instructions (Signed)
Thank you for coming in today.   You received an injection today. Seek immediate medical attention if the joint becomes red, extremely painful, or is oozing fluid.  

## 2023-05-01 DIAGNOSIS — M25552 Pain in left hip: Secondary | ICD-10-CM | POA: Diagnosis not present

## 2023-05-01 DIAGNOSIS — M25551 Pain in right hip: Secondary | ICD-10-CM | POA: Diagnosis not present

## 2023-05-01 DIAGNOSIS — M25511 Pain in right shoulder: Secondary | ICD-10-CM | POA: Diagnosis not present

## 2023-05-01 DIAGNOSIS — M25512 Pain in left shoulder: Secondary | ICD-10-CM | POA: Diagnosis not present

## 2023-05-01 DIAGNOSIS — M6281 Muscle weakness (generalized): Secondary | ICD-10-CM | POA: Diagnosis not present

## 2023-05-04 DIAGNOSIS — M25551 Pain in right hip: Secondary | ICD-10-CM | POA: Diagnosis not present

## 2023-05-04 DIAGNOSIS — M25511 Pain in right shoulder: Secondary | ICD-10-CM | POA: Diagnosis not present

## 2023-05-04 DIAGNOSIS — M25552 Pain in left hip: Secondary | ICD-10-CM | POA: Diagnosis not present

## 2023-05-04 DIAGNOSIS — M25512 Pain in left shoulder: Secondary | ICD-10-CM | POA: Diagnosis not present

## 2023-05-04 DIAGNOSIS — M6281 Muscle weakness (generalized): Secondary | ICD-10-CM | POA: Diagnosis not present

## 2023-05-07 DIAGNOSIS — M25551 Pain in right hip: Secondary | ICD-10-CM | POA: Diagnosis not present

## 2023-05-07 DIAGNOSIS — M25511 Pain in right shoulder: Secondary | ICD-10-CM | POA: Diagnosis not present

## 2023-05-07 DIAGNOSIS — M25512 Pain in left shoulder: Secondary | ICD-10-CM | POA: Diagnosis not present

## 2023-05-07 DIAGNOSIS — M6281 Muscle weakness (generalized): Secondary | ICD-10-CM | POA: Diagnosis not present

## 2023-05-07 DIAGNOSIS — M25552 Pain in left hip: Secondary | ICD-10-CM | POA: Diagnosis not present

## 2023-05-17 ENCOUNTER — Encounter: Payer: Self-pay | Admitting: Internal Medicine

## 2023-05-17 NOTE — Patient Instructions (Addendum)
      Blood work was ordered.   The lab is on the first floor.    Medications changes include :   none    A referral was ordered GI and someone will call you to schedule an appointment.     Return in about 6 months (around 11/18/2023) for Physical Exam.

## 2023-05-17 NOTE — Progress Notes (Unsigned)
Subjective:    Patient ID: Charles [name], male    DOB: [birth year], 72 y.o.   MRN: [id number]     HPI Nihan is here for follow up of his chronic medical problems.   Had injections in hips and shoulders -- currently doing PT.   Medications and allergies reviewed with patient and updated if appropriate.  Current Outpatient Medications on File Prior to Visit  Medication Sig Dispense Refill   allopurinol (ZYLOPRIM) 100 MG tablet TAKE 2 TABLETS BY MOUTH EVERY DAY 180 tablet 1   colchicine 0.6 MG tablet TAKE 1 TABLET BY MOUTH EVERY DAY 90 tablet 1   ezetimibe (ZETIA) 10 MG tablet TAKE 1 TABLET BY MOUTH EVERY DAY 90 tablet 3   levothyroxine (SYNTHROID) 200 MCG tablet Take 1 tablet (200 mcg total) by mouth daily. 90 tablet 3   levothyroxine (SYNTHROID) 25 MCG tablet Take 1 tablet (25 mcg total) by mouth daily. In addition to 200 mcg pill for total of 225 mcg daily 90 tablet 3   omeprazole (PRILOSEC) 20 MG capsule Take 20 mg by mouth daily.     tadalafil (CIALIS) 20 MG tablet TAKE ONE HALF OF TABLET TO ONE TABLET EVERY 3 DAYS AS NEEDED. (Patient taking differently: Take 10-20 mg by mouth daily as needed for erectile dysfunction.) 10 tablet 8   Testosterone 20.25 MG/ACT (1.62%) GEL APPLY 4 PUMPS DAILY TO UPPER ARM OR CHEST DAILY 150 g 2   No current facility-administered medications on file prior to visit.     Review of Systems  Constitutional:  Negative for fever.  Respiratory:  Negative for cough, shortness of breath and wheezing.   Cardiovascular:  Negative for chest pain, palpitations and leg swelling.  Neurological:  Negative for light-headedness and headaches.       Objective:   Vitals:   05/18/23 0834  BP: 136/72  Pulse: 65  Temp: 98.4 F (36.9 C)  SpO2: 95%   BP Readings from Last 3 Encounters:  05/18/23 136/72  04/29/23 130/84  04/15/23 (!) 172/96   Wt Readings from Last 3 Encounters:  05/18/23 256 lb (116.1 kg)  04/29/23 261 lb (118.4 kg)   04/15/23 271 lb (122.9 kg)   Body mass index is 35.7 kg/m.    Physical Exam Constitutional:      General: He is not in acute distress.    Appearance: Normal appearance. He is not ill-appearing.  HENT:     Head: Normocephalic and atraumatic.  Eyes:     Conjunctiva/sclera: Conjunctivae normal.  Neck:     Vascular: Carotid bruit (right) present.  Cardiovascular:     Rate and Rhythm: Normal rate and regular rhythm.     Heart sounds: Normal heart sounds.  Pulmonary:     Effort: Pulmonary effort is normal. No respiratory distress.     Breath sounds: Normal breath sounds. No wheezing or rales.  Musculoskeletal:     Right lower leg: No edema.     Left lower leg: No edema.  Skin:    General: Skin is warm and dry.     Findings: No rash.  Neurological:     Mental Status: He is alert. Mental status is at baseline.  Psychiatric:        Mood and Affect: Mood normal.        Lab Results  Component Value Date   WBC 9.0 11/17/2022   HGB 16.8 11/17/2022   HCT 47.8 11/17/2022   PLT 250.0 11/17/2022   GLUCOSE  90 11/17/2022   CHOL 163 11/17/2022   TRIG 155.0 (H) 11/17/2022   HDL 37.90 (L) 11/17/2022   LDLDIRECT 124.0 07/11/2019   LDLCALC 94 11/17/2022   ALT 31 11/17/2022   AST 33 11/17/2022   NA 138 11/17/2022   K 4.4 11/17/2022   CL 103 11/17/2022   CREATININE 1.20 11/17/2022   BUN 13 11/17/2022   CO2 22 11/17/2022   TSH 8.75 (H) 11/17/2022   PSA 0.40 11/17/2022   HGBA1C 6.2 11/17/2022     Assessment & Plan:    See Problem List for Assessment and Plan of chronic medical problems.

## 2023-05-18 ENCOUNTER — Encounter: Payer: Self-pay | Admitting: Internal Medicine

## 2023-05-18 ENCOUNTER — Ambulatory Visit (INDEPENDENT_AMBULATORY_CARE_PROVIDER_SITE_OTHER): Payer: Medicare HMO | Admitting: Internal Medicine

## 2023-05-18 VITALS — BP 136/72 | HR 65 | Temp 98.4°F | Ht 71.0 in | Wt 256.0 lb

## 2023-05-18 DIAGNOSIS — M159 Polyosteoarthritis, unspecified: Secondary | ICD-10-CM

## 2023-05-18 DIAGNOSIS — I10 Essential (primary) hypertension: Secondary | ICD-10-CM

## 2023-05-18 DIAGNOSIS — R0989 Other specified symptoms and signs involving the circulatory and respiratory systems: Secondary | ICD-10-CM | POA: Diagnosis not present

## 2023-05-18 DIAGNOSIS — E782 Mixed hyperlipidemia: Secondary | ICD-10-CM

## 2023-05-18 DIAGNOSIS — R7303 Prediabetes: Secondary | ICD-10-CM | POA: Diagnosis not present

## 2023-05-18 DIAGNOSIS — Z6838 Body mass index (BMI) 38.0-38.9, adult: Secondary | ICD-10-CM

## 2023-05-18 DIAGNOSIS — Z86010 Personal history of colonic polyps: Secondary | ICD-10-CM

## 2023-05-18 DIAGNOSIS — E349 Endocrine disorder, unspecified: Secondary | ICD-10-CM

## 2023-05-18 DIAGNOSIS — E038 Other specified hypothyroidism: Secondary | ICD-10-CM | POA: Diagnosis not present

## 2023-05-18 LAB — COMPREHENSIVE METABOLIC PANEL
ALT: 31 U/L (ref 0–53)
AST: 27 U/L (ref 0–37)
Albumin: 4.3 g/dL (ref 3.5–5.2)
Alkaline Phosphatase: 70 U/L (ref 39–117)
BUN: 15 mg/dL (ref 6–23)
CO2: 25 mEq/L (ref 19–32)
Calcium: 9.8 mg/dL (ref 8.4–10.5)
Chloride: 100 mEq/L (ref 96–112)
Creatinine, Ser: 1.38 mg/dL (ref 0.40–1.50)
GFR: 51.73 mL/min — ABNORMAL LOW (ref >60.00)
Glucose, Bld: 139 mg/dL — ABNORMAL HIGH (ref 70–99)
Potassium: 3.9 mEq/L (ref 3.5–5.1)
Sodium: 136 mEq/L (ref 135–145)
Total Bilirubin: 0.8 mg/dL (ref 0.2–1.2)
Total Protein: 7.4 g/dL (ref 6.0–8.3)

## 2023-05-18 LAB — LIPID PANEL
Cholesterol: 165 mg/dL (ref 0–200)
HDL: 42.7 mg/dL (ref >39.00)
LDL Cholesterol: 104 mg/dL — ABNORMAL HIGH (ref 0–99)
NonHDL: 122.2
Total CHOL/HDL Ratio: 4
Triglycerides: 91 mg/dL (ref 0.0–149.0)
VLDL: 18.2 mg/dL (ref 0.0–40.0)

## 2023-05-18 LAB — CBC WITH DIFFERENTIAL/PLATELET
Basophils Absolute: 0.1 10*3/uL (ref 0.0–0.1)
Basophils Relative: 0.6 % (ref 0.0–3.0)
Eosinophils Absolute: 0.3 10*3/uL (ref 0.0–0.7)
Eosinophils Relative: 2.3 % (ref 0.0–5.0)
HCT: 50.5 % (ref 39.0–52.0)
Hemoglobin: 16.8 g/dL (ref 13.0–17.0)
Lymphocytes Relative: 20.4 % (ref 12.0–46.0)
Lymphs Abs: 2.3 10*3/uL (ref 0.7–4.0)
MCHC: 33.4 g/dL (ref 30.0–36.0)
MCV: 93 fl (ref 78.0–100.0)
Monocytes Absolute: 0.6 10*3/uL (ref 0.1–1.0)
Monocytes Relative: 5.6 % (ref 3.0–12.0)
Neutro Abs: 8 10*3/uL — ABNORMAL HIGH (ref 1.4–7.7)
Neutrophils Relative %: 71.1 % (ref 43.0–77.0)
Platelets: 206 10*3/uL (ref 150.0–400.0)
RBC: 5.43 Mil/uL (ref 4.22–5.81)
RDW: 14.5 % (ref 11.5–15.5)
WBC: 11.2 10*3/uL — ABNORMAL HIGH (ref 4.0–10.5)

## 2023-05-18 LAB — HEMOGLOBIN A1C: Hgb A1c MFr Bld: 6.4 % (ref 4.6–6.5)

## 2023-05-18 LAB — TSH: TSH: 0.38 u[IU]/mL (ref 0.35–5.50)

## 2023-05-18 NOTE — Assessment & Plan Note (Signed)
Chronic Hands, knees, shoulders Takes ibuprofen as needed-not on a daily basis

## 2023-05-18 NOTE — Assessment & Plan Note (Signed)
Chronic  Clinically euthyroid Last TSH was elevated and medication was adjusted Check tsh and will titrate med dose if needed Currently taking levothyroxine 225 mcg daily

## 2023-05-18 NOTE — Assessment & Plan Note (Signed)
Chronic Blood pressure well controlled here Advised monitoring BP at home At 1 point was on lisinopril Continue to monitor off medication-lifestyle controlled

## 2023-05-18 NOTE — Assessment & Plan Note (Signed)
Chronic Has lost weight this year with decreasing intake Encouraged further weight loss Continue decrease portions Advised diet high in protein and vegetables, low in sugars and carbs Continue to be active

## 2023-05-18 NOTE — Assessment & Plan Note (Signed)
Chronic Doing well with testosterone replacement Check testosterone, CBC Continue testosterone 4 pumps daily-he is currently paying for this out-of-pocket because is not covered by insurance

## 2023-05-18 NOTE — Assessment & Plan Note (Addendum)
Possible right carotid  Discussed possible [place] - he will consider - will discuss at next visit

## 2023-05-18 NOTE — Assessment & Plan Note (Signed)
Chronic Check a1c Low sugar / carb diet Stressed regular exercise  

## 2023-05-18 NOTE — Assessment & Plan Note (Addendum)
Chronic Statin intolerant-myalgias-Lipitor and Crestor Regular exercise and healthy diet encouraged Check lipid panel  Continue Zetia 10 mg daily Discussed coronary artery Ct scan to evaluate plaque in arteries -- he will consider

## 2023-05-21 DIAGNOSIS — M6281 Muscle weakness (generalized): Secondary | ICD-10-CM | POA: Diagnosis not present

## 2023-05-21 DIAGNOSIS — M25512 Pain in left shoulder: Secondary | ICD-10-CM | POA: Diagnosis not present

## 2023-05-21 DIAGNOSIS — M25552 Pain in left hip: Secondary | ICD-10-CM | POA: Diagnosis not present

## 2023-05-21 DIAGNOSIS — M25551 Pain in right hip: Secondary | ICD-10-CM | POA: Diagnosis not present

## 2023-05-21 DIAGNOSIS — M25511 Pain in right shoulder: Secondary | ICD-10-CM | POA: Diagnosis not present

## 2023-05-24 ENCOUNTER — Other Ambulatory Visit: Payer: Self-pay | Admitting: Internal Medicine

## 2023-05-25 DIAGNOSIS — M25552 Pain in left hip: Secondary | ICD-10-CM | POA: Diagnosis not present

## 2023-05-25 DIAGNOSIS — M25512 Pain in left shoulder: Secondary | ICD-10-CM | POA: Diagnosis not present

## 2023-05-25 DIAGNOSIS — M6281 Muscle weakness (generalized): Secondary | ICD-10-CM | POA: Diagnosis not present

## 2023-05-25 DIAGNOSIS — M25551 Pain in right hip: Secondary | ICD-10-CM | POA: Diagnosis not present

## 2023-05-25 DIAGNOSIS — M25511 Pain in right shoulder: Secondary | ICD-10-CM | POA: Diagnosis not present

## 2023-06-17 ENCOUNTER — Ambulatory Visit: Payer: Medicare HMO | Admitting: Sports Medicine

## 2023-06-17 VITALS — BP 136/72 | HR 74 | Ht 71.0 in | Wt 256.0 lb

## 2023-06-17 DIAGNOSIS — M25551 Pain in right hip: Secondary | ICD-10-CM

## 2023-06-17 DIAGNOSIS — G8929 Other chronic pain: Secondary | ICD-10-CM | POA: Diagnosis not present

## 2023-06-17 DIAGNOSIS — M7061 Trochanteric bursitis, right hip: Secondary | ICD-10-CM | POA: Diagnosis not present

## 2023-06-17 NOTE — Patient Instructions (Signed)
3-4 week follow up

## 2023-06-17 NOTE — Progress Notes (Signed)
[name] [address] [place] [address] Phone: [phone]   Assessment and Plan:     1. Greater trochanteric bursitis of right hip 2. Chronic right hip pain  -Chronic with exacerbation, subsequent sports medicine visit - Patient presents with recurrence of right lateral hip pain most consistent with greater trochanteric bursitis of right hip.  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  Tolerated well per note below.  CSI may temporarily increase blood glucose and blood pressure in patient with past medical history of hypertension and prediabetes - May use Tylenol for day-to-day pain relief - Continue activity as tolerated  Procedure: Greater trochanteric bursal injection Side: Right  Risks explained and consent was given verbally. The site was cleaned with alcohol prep. A steroid injection was performed with patient in the lateral side-lying position at area of maximum tenderness over greater trochanter using 2mL of 1% lidocaine without epinephrine and 1mL of kenalog 40mg /ml. This was well tolerated and resulted in symptomatic relief.  Needle was removed, hemostasis achieved, and post injection instructions were explained.  Pt was advised to call or return to clinic if these symptoms worsen or fail to improve as anticipated.    Pertinent previous records reviewed include none   Follow Up: 3 to 4 weeks for reevaluation.  If no improvement, would consider intra-articular hip CSI.  Patient mentioned history of chronic knee pain which could be further evaluated with x-rays at follow-up   Subjective:   I, Charles [name], am serving as a Neurosurgeon for Doctor Richardean Sale  Chief Complaint: hip pain   HPI:   04/29/2023 Charles [name] is a 72 y.o. male who presents to [facility] at [facility] today for f/u bilat shoulder and hip pain. Pt was last seen by Dr. Denyse Amass on 04/15/23 and was given a R subacromial and R lateral hip steroid injections and was advised to return in 1 wk to consider additional injections. He was also referred to [facility] PT.   Today, pt reports improvement of right shoulder and hip s/p injection. Continues to have left shoulder pain. Denies radicular sx. Denies weakness/decreased grip strength. Denies n/t in the left hand. Denies new injury to the left shoulder. Also notes numbness in the right 1-3 fingers. This has been present over the past couple of years.    Dominant pain left shoulder and left lateral hip.   Dx imaging: 06/04/21 R & L shoulder MRI             05/07/21 Bilat hips/pelvis & L-spine XR             03/12/21 R & L shoulder XR             01/22/21 Pelvis XR   Pertinent review of systems: No fevers or chills  06/17/23 Patient states made the pain worse and that he shot really helped   Relevant Historical Information: Hypertension, prediabetes, hypothyroidism  Additional pertinent review of systems negative.   Current Outpatient Medications:    allopurinol (ZYLOPRIM) 100 MG tablet, TAKE 2 TABLETS BY MOUTH EVERY DAY, Disp: 180 tablet, Rfl: 1   colchicine 0.6 MG tablet, TAKE 1 TABLET BY MOUTH EVERY DAY, Disp: 90 tablet, Rfl: 1   ezetimibe (ZETIA) 10 MG tablet, TAKE 1 TABLET BY MOUTH EVERY  DAY, Disp: 90 tablet, Rfl: 3   levothyroxine (SYNTHROID) 200 MCG tablet, TAKE 1 TABLET BY MOUTH EVERY DAY, Disp: 90 tablet, Rfl: 3   levothyroxine (SYNTHROID) 25 MCG tablet, Take 1 tablet (25 mcg total) by mouth daily. In addition to 200 mcg pill for total of 225 mcg daily, Disp: 90 tablet, Rfl: 3   omeprazole (PRILOSEC) 20 MG capsule, Take 20 mg by mouth daily., Disp: , Rfl:    tadalafil (CIALIS) 20 MG tablet, TAKE ONE HALF OF TABLET TO ONE TABLET EVERY 3 DAYS AS NEEDED. (Patient  taking differently: Take 10-20 mg by mouth daily as needed for erectile dysfunction.), Disp: 10 tablet, Rfl: 8   Testosterone 20.25 MG/ACT (1.62%) GEL, APPLY 4 PUMPS DAILY TO UPPER ARM OR CHEST DAILY, Disp: 150 g, Rfl: 2   Objective:     Vitals:   06/17/23 0944  BP: 136/72  Pulse: 74  SpO2: 96%  Weight: 256 lb (116.1 kg)  Height: 5\' 11"  (1.803 m)      Body mass index is 35.7 kg/m.    Physical Exam:    General: awake, alert, and oriented no acute distress, nontoxic Skin: no suspicious lesions or rashes Neuro:sensation intact distally with no deficits, normal muscle tone, no atrophy, strength 5/5 in all tested lower ext groups Psych: normal mood and affect, speech clear   Right hip: No deformity, swelling or wasting ROM Flexion 90, ext 30, IR 45, ER 45 TTP greater trochanter, gluteal musculature NTTP over the hip flexors, si joint, lumbar spine Negative log roll with FROM Positive FABER for lateral hip pain Positive FADIR for lateral hip pain Negative Piriformis test Negative trendelenberg Gait normal    Electronically signed by:  [name] 10:11 AM 06/17/23

## 2023-06-26 ENCOUNTER — Encounter: Payer: Self-pay | Admitting: Internal Medicine

## 2023-07-01 DIAGNOSIS — N5201 Erectile dysfunction due to arterial insufficiency: Secondary | ICD-10-CM | POA: Diagnosis not present

## 2023-07-01 DIAGNOSIS — E291 Testicular hypofunction: Secondary | ICD-10-CM | POA: Diagnosis not present

## 2023-07-03 ENCOUNTER — Ambulatory Visit (AMBULATORY_SURGERY_CENTER): Payer: Medicare HMO | Admitting: *Deleted

## 2023-07-03 VITALS — Ht 71.0 in | Wt 255.0 lb

## 2023-07-03 DIAGNOSIS — Z86010 Personal history of colonic polyps: Secondary | ICD-10-CM

## 2023-07-03 MED ORDER — NA SULFATE-K SULFATE-MG SULF 17.5-3.13-1.6 GM/177ML PO SOLN: 1.0000 | Freq: Once | ORAL | 0 refills | Status: AC

## 2023-07-03 NOTE — Progress Notes (Signed)

## 2023-07-07 ENCOUNTER — Other Ambulatory Visit: Payer: Self-pay | Admitting: Family Medicine

## 2023-07-07 ENCOUNTER — Other Ambulatory Visit: Payer: Self-pay | Admitting: Internal Medicine

## 2023-07-10 ENCOUNTER — Encounter: Payer: Self-pay | Admitting: Internal Medicine

## 2023-07-13 ENCOUNTER — Telehealth: Payer: Self-pay | Admitting: Internal Medicine

## 2023-07-13 NOTE — Telephone Encounter (Signed)
Prescription Request  07/13/2023  LOV: 05/18/2023  What is the name of the medication or equipment? Testosterone gel  Have you contacted your pharmacy to request a refill? Yes   Which pharmacy would you like this sent to?  [id number] [place], [place] - [address] [place] [address] Phone: [phone] Fax: [phone]    Patient notified that their request is being sent to the clinical staff for review and that they should receive a response within 2 business days.   Please advise at Mobile [phone] (mobile)

## 2023-07-14 NOTE — Telephone Encounter (Signed)
Script was sent to pharmacy on 07/07/23.

## 2023-07-24 ENCOUNTER — Encounter: Payer: Medicare HMO | Admitting: Internal Medicine

## 2023-07-29 ENCOUNTER — Ambulatory Visit: Payer: Medicare HMO | Admitting: Sports Medicine

## 2023-07-29 ENCOUNTER — Other Ambulatory Visit: Payer: Self-pay

## 2023-07-29 VITALS — BP 132/82 | HR 57 | Ht 71.0 in | Wt 260.0 lb

## 2023-07-29 DIAGNOSIS — M25551 Pain in right hip: Secondary | ICD-10-CM | POA: Diagnosis not present

## 2023-07-29 DIAGNOSIS — G8929 Other chronic pain: Secondary | ICD-10-CM | POA: Diagnosis not present

## 2023-07-29 DIAGNOSIS — M1611 Unilateral primary osteoarthritis, right hip: Secondary | ICD-10-CM

## 2023-07-29 NOTE — Patient Instructions (Signed)
3-4 week follow up  Tylenol [phone] mg 2-3 times a day for pain relief

## 2023-07-29 NOTE — Progress Notes (Signed)
[name] [address] [place] [address] Phone: [phone]   Assessment and Plan:     1. Chronic right hip pain 2. Primary osteoarthritis of right hip  -Chronic with exacerbation, subsequent visit - Patient had mild to moderate relief for 3 to 4 weeks after greater trochanteric CSI at previous office visit on 06/17/2023, however pain never fully resolved.  I suspect patient's remaining pain is more likely related to intra-articular femoral acetabular osteoarthritis as seen on previous imaging - Patient elected for intra-articular right hip CSI.  Tolerated well per note below.  CSI may temporarily increase blood glucose and blood pressure in patient with past medical history of hypertension and prediabetes - May continue use Tylenol as needed for day-to-day pain relief  Procedure: Ultrasound Guided Hip Acetabulofemoral Joint Injection Side: Right Diagnosis: Flare of osteoarthritis [place] Indication:  - accuracy is paramount for diagnosis - to ensure therapeutic efficacy or procedural success - to reduce procedural risk  After explaining the procedure, viable alternatives, risks, and answering any questions, consent was given verbally. The site was cleaned with chlorhexidine prep. An ultrasound transducer was placed on the anterior thigh/hip.   The acetabular joint, labrum, and femoral shaft were identified.  The neurovascular structures were identified and an approach was found specifically avoiding these structures.  A steroid injection was performed under ultrasound guidance with sterile technique using 2ml of 1% lidocaine without epinephrine and 40 mg of triamcinolone (KENALOG) 40mg /ml. This was well tolerated and resulted in  relief.  Needle was removed and dressing placed and post injection instructions were given including  a discussion of likely return of pain today after the anesthetic wears off (with the possibility of worsened  pain) until the steroid starts to work in 1-3 days.   Pt was advised to call or return to clinic if these symptoms worsen or fail to improve as anticipated.   Pertinent previous records reviewed include none   Follow Up: 3 to 4 weeks for reevaluation.  If no improvement or worsening of symptoms, could consider orthopedic referral versus advanced imaging.  If hip is improving, could further discuss chronic shoulder pain for which patient has prior MRIs   Subjective:   I, Charles [name], am serving as a Neurosurgeon for Doctor Richardean Sale   Chief Complaint: hip pain    HPI:    04/29/2023 Charles [name] is a 72 y.o. male who presents to [facility] at [facility] today for f/u bilat shoulder and hip pain. Pt was last seen by Dr. Denyse Amass on 04/15/23 and was given a R subacromial and R lateral hip steroid injections and was advised to return in 1 wk to consider additional injections. He was also referred to [facility] PT.   Today, pt reports improvement of right shoulder and hip s/p injection. Continues to have left shoulder pain. Denies radicular sx. Denies weakness/decreased grip strength. Denies n/t in the left hand. Denies new injury to the left shoulder. Also notes numbness in the right 1-3 fingers. This has been present over the past couple of years.    Dominant pain left shoulder and left lateral hip.   Dx imaging: 06/04/21 R & L shoulder MRI             05/07/21 Bilat hips/pelvis & L-spine XR             03/12/21 R & L shoulder XR  01/22/21 Pelvis XR   Pertinent review of systems: No fevers or chills   06/17/23 Patient states made the pain worse and that he shot really helped   07/29/2023 Patient states that CSI helped for about 71 weeks , pain is back    Relevant Historical Information: Hypertension, prediabetes, hypothyroidism Additional pertinent review of systems negative.   Current Outpatient Medications:    allopurinol (ZYLOPRIM) 100 MG tablet, TAKE 2  TABLETS BY MOUTH EVERY DAY, Disp: 180 tablet, Rfl: 1   colchicine 0.6 MG tablet, TAKE 1 TABLET BY MOUTH EVERY DAY, Disp: 90 tablet, Rfl: 1   ezetimibe (ZETIA) 10 MG tablet, TAKE 1 TABLET BY MOUTH EVERY DAY, Disp: 90 tablet, Rfl: 3   levothyroxine (SYNTHROID) 200 MCG tablet, TAKE 1 TABLET BY MOUTH EVERY DAY, Disp: 90 tablet, Rfl: 3   levothyroxine (SYNTHROID) 25 MCG tablet, Take 1 tablet (25 mcg total) by mouth daily. In addition to 200 mcg pill for total of 225 mcg daily, Disp: 90 tablet, Rfl: 3   omeprazole (PRILOSEC) 20 MG capsule, Take 20 mg by mouth daily., Disp: , Rfl:    tadalafil (CIALIS) 20 MG tablet, TAKE ONE HALF OF TABLET TO ONE TABLET EVERY 3 DAYS AS NEEDED. (Patient taking differently: Take 10-20 mg by mouth daily as needed for erectile dysfunction.), Disp: 10 tablet, Rfl: 8   Testosterone 20.25 MG/ACT (1.62%) GEL, APPLY 4 PUMPS DAILY TO UPPER ARM OR CHEST DAILY, Disp: 150 g, Rfl: 0   Objective:     Vitals:   07/29/23 0912  BP: 132/82  Pulse: (!) 57  SpO2: 97%  Weight: 260 lb (117.9 kg)  Height: 5\' 11"  (1.803 m)      Body mass index is 36.26 kg/m.    Physical Exam:    General: awake, alert, and oriented no acute distress, nontoxic Skin: no suspicious lesions or rashes Neuro:sensation intact distally with no deficits, normal muscle tone, no atrophy, strength 5/5 in all tested lower ext groups Psych: normal mood and affect, speech clear   Right hip: No deformity, swelling or wasting ROM Flexion 90, ext 30, IR 45, ER 45 Mild TTP greater trochanter, gluteal musculature NTTP over the hip flexors, si joint, lumbar spine Negative log roll with FROM Positive FABER for anterior hip pain Positive FADIR for anterior hip pain Negative Piriformis test Negative trendelenberg Gait normal     Electronically signed by:  Charles [name] [facility] 9:25 AM 07/29/23

## 2023-08-04 ENCOUNTER — Ambulatory Visit (INDEPENDENT_AMBULATORY_CARE_PROVIDER_SITE_OTHER): Payer: Medicare HMO

## 2023-08-04 VITALS — Ht 71.0 in | Wt 260.0 lb

## 2023-08-04 DIAGNOSIS — Z0000 Encounter for general adult medical examination without abnormal findings: Secondary | ICD-10-CM

## 2023-08-04 NOTE — Patient Instructions (Addendum)
Charles [name] , Thank you for taking time to come for your Medicare Wellness Visit. I appreciate your ongoing commitment to your health goals. Please review the following plan we discussed and let me know if I can assist you in the future.   Referrals/Orders/Follow-Ups/Clinician Recommendations: Remember to call [facility], [phone], to schedule your colonoscopy.  You are due for a Flu vaccine and can get that done at your local pharmacy.  It was nice speaking to you today.  Each day, aim for 6 glasses of water, plenty of protein in your diet and try to get up and walk/ stretch every hour for 5-10 minutes at a time.     This is a list of the screening recommended for you and due dates:  Health Maintenance  Topic Date Due   Zoster (Shingles) Vaccine (1 of 2) Never done   Colon Cancer Screening  05/19/2021   Flu Shot  05/21/2023   COVID-19 Vaccine (5 - 2023-24 season) 06/21/2023   Medicare Annual Wellness Visit  08/03/2024   DTaP/Tdap/Td vaccine (3 - Td or Tdap) 01/20/2030   Pneumonia Vaccine  Completed   Hepatitis C Screening  Completed   HPV Vaccine  Aged Out    Advanced directives: (Copy Requested) Please bring a copy of your health care power of attorney and living will to the office to be added to your chart at your convenience.  Next Medicare Annual Wellness Visit scheduled for next year: Yes
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
## 2023-08-06 ENCOUNTER — Ambulatory Visit: Payer: Medicare HMO | Admitting: Family Medicine

## 2023-08-06 ENCOUNTER — Ambulatory Visit: Payer: Medicare HMO

## 2023-08-06 ENCOUNTER — Encounter: Payer: Self-pay | Admitting: Family Medicine

## 2023-08-06 VITALS — BP 124/86 | HR 68 | Ht 71.0 in | Wt 258.0 lb

## 2023-08-06 DIAGNOSIS — M545 Low back pain, unspecified: Secondary | ICD-10-CM

## 2023-08-06 DIAGNOSIS — M25551 Pain in right hip: Secondary | ICD-10-CM | POA: Diagnosis not present

## 2023-08-06 DIAGNOSIS — M47816 Spondylosis without myelopathy or radiculopathy, lumbar region: Secondary | ICD-10-CM | POA: Diagnosis not present

## 2023-08-06 DIAGNOSIS — M1611 Unilateral primary osteoarthritis, right hip: Secondary | ICD-10-CM | POA: Diagnosis not present

## 2023-08-06 MED ORDER — TIZANIDINE HCL 4 MG PO TABS: 4.0000 mg | ORAL_TABLET | Freq: Three times a day (TID) | ORAL | 1 refills | Status: DC | PRN

## 2023-08-06 NOTE — Patient Instructions (Signed)
Thank you for coming in today.  You received an injection today. Seek immediate medical attention if the joint becomes red, extremely painful, or is oozing fluid.  Please get an Xray today before you leave

## 2023-08-06 NOTE — Progress Notes (Signed)
I, Stevenson Clinch, CMA acting as a Neurosurgeon for [name], MD.  Charles [name] is a 72 y.o. male who presents to [facility] today for new LBP and to f/u after completing PT for his shoulders and hips. Pt was last seen by Dr. Denyse Amass on 04/29/23 and was given a L subacromial and L GT steroid injections and was advised to cont PT at [facility]. Dr. Jean Rosenthal gave him a R GT CSI and on 10/9 a R interarticular hip CSI.  Karandeep notes that he did have immediate but short-lived good response to pain from an intra-articular right hip injection performed last week by Dr. Jean Rosenthal.  Today, pt c/o LBP x 1-2 months. Pt locates pain to entire lower back. Sx started after doing gluteal bridges.   Radiating pain: right hip LE numbness/tingling: LE weakness: no, ambulating with a cane Aggravates: sit-to-stand, bending at the waist Treatments tried: Advil 800 mg, short-term relief with heat/ice.   Dx imaging: 06/04/21 R & L shoulder MRI             05/07/21 Bilat hips/pelvis & L-spine XR             03/12/21 R & L shoulder XR             01/22/21 Pelvis XR  Pertinent review of systems: no fever or chills.   Relevant historical information: Hypertension   Exam:  BP 124/86   Pulse 68   Ht 5\' 11"  (1.803 m)   Wt 258 lb (117 kg)   SpO2 97%   BMI 35.98 kg/m  General: Well Developed, well nourished, and in no acute distress.   MSK: L-spine: Normal appearing Nontender palpation spinal midline. Tender palpation right lumbar paraspinal musculature. Decreased lumbar motion. Lower extremity strength is intact.  Right hip normal appearing. Decreased range of motion. Tender palpation greater trochanter.    Lab and Radiology Results  Hip greater trochanteric injection: Right Consent obtained and timeout performed. Area of maximum tenderness palpated and identified. Skin cleaned with alcohol, cold spray applied. A 22-gauge needle was used to access the greater  trochanteric bursa. 40mg  of Kenalog and 2 mL of Marcaine were used to inject the trochanteric bursa. Patient tolerated the procedure well.   X-ray images lumbar spine and right hip obtained today personally and independently interpreted  Lumbar spine: Multilevel degenerative changes.  No acute fractures.  Right hip: Severe right hip DJD.  No acute fractures  Await formal radiology review     Assessment and Plan: 72 y.o. male with chronic right hip pain and acute exacerbation of right low back pain.  The acute exacerbation of the right low back pain is the most severe pain currently.  He has had a trial of physical therapy for this already and lots of other treatments.  Plan for MRI to further characterize source of pain.  Additionally tizanidine prescribed today.  As for his chronic hip pain he has had a lot of treatment for this as well including greater trochanter injections physical therapy and most recently an interarticular hip injection last week.  This provided temporary but good relief indicating that his primary pain generator is probably the right hip arthritis seen on x-ray today.  Plan to refer to orthopedic surgery to have surgical discussion regarding hip replacement.  Recheck after we get the results back from the upcoming lumbar spine MRI.   PDMP not reviewed this encounter. Orders Placed This Encounter  Procedures   DG  Lumbar Spine 2-3 Views    Standing Status:   Future    Number of Occurrences:   1    Standing Expiration Date:   09/06/2023    Order Specific Question:   Reason for Exam (SYMPTOM  OR DIAGNOSIS REQUIRED)    Answer:   lbp    Order Specific Question:   Preferred imaging location?    Answer:   [facility]   DG HIP UNILAT W OR W/O PELVIS 2-3 VIEWS RIGHT    Standing Status:   Future    Number of Occurrences:   1    Standing Expiration Date:   09/06/2023    Order Specific Question:   Reason for Exam (SYMPTOM  OR DIAGNOSIS REQUIRED)     Answer:   right hip pain    Order Specific Question:   Preferred imaging location?    Answer:   [facility]   MR LUMBAR SPINE WO CONTRAST    Standing Status:   Future    Standing Expiration Date:   09/06/2023    Order Specific Question:   What is the patient's sedation requirement?    Answer:   No Sedation    Order Specific Question:   Does the patient have a pacemaker or implanted devices?    Answer:   No    Order Specific Question:   Preferred imaging location?    Answer:   [facility] (table limit-350lbs)   Meds ordered this encounter  Medications   tiZANidine (ZANAFLEX) 4 MG tablet    Sig: Take 1 tablet (4 mg total) by mouth every 8 (eight) hours as needed.    Dispense:  30 tablet    Refill:  1     Discussed warning signs or symptoms. Please see discharge instructions. Patient expresses understanding.   The above documentation has been reviewed and is accurate and complete [name], M.D.

## 2023-08-10 ENCOUNTER — Ambulatory Visit: Payer: Medicare HMO | Admitting: Family Medicine

## 2023-08-12 ENCOUNTER — Encounter: Payer: Self-pay | Admitting: Orthopaedic Surgery

## 2023-08-12 ENCOUNTER — Ambulatory Visit: Payer: Medicare HMO | Admitting: Orthopaedic Surgery

## 2023-08-12 VITALS — Ht 71.0 in | Wt 268.0 lb

## 2023-08-12 DIAGNOSIS — M1611 Unilateral primary osteoarthritis, right hip: Secondary | ICD-10-CM | POA: Diagnosis not present

## 2023-08-12 NOTE — Addendum Note (Signed)
Addended by: Evon Slack on: 08/12/2023 03:28 PM   Modules accepted: Orders
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
## 2023-08-27 ENCOUNTER — Encounter: Payer: Self-pay | Admitting: Family Medicine

## 2023-08-31 NOTE — Progress Notes (Signed)
Low back x-ray shows arthritis worse than previously seen on x-ray 2 years ago

## 2023-08-31 NOTE — Progress Notes (Signed)
Right hip x-ray shows medium arthritis.

## 2023-09-02 ENCOUNTER — Ambulatory Visit
Admission: RE | Admit: 2023-09-02 | Discharge: 2023-09-02 | Disposition: A | Discharge status: Home / Self Care | Payer: Medicare HMO | Source: Ambulatory Visit | Attending: Family Medicine | Admitting: Family Medicine

## 2023-09-02 DIAGNOSIS — M47816 Spondylosis without myelopathy or radiculopathy, lumbar region: Secondary | ICD-10-CM | POA: Diagnosis not present

## 2023-09-02 DIAGNOSIS — M545 Low back pain, unspecified: Secondary | ICD-10-CM

## 2023-09-02 DIAGNOSIS — M5186 Other intervertebral disc disorders, lumbar region: Secondary | ICD-10-CM | POA: Diagnosis not present

## 2023-09-02 DIAGNOSIS — M2578 Osteophyte, vertebrae: Secondary | ICD-10-CM | POA: Diagnosis not present

## 2023-09-08 ENCOUNTER — Other Ambulatory Visit: Payer: Self-pay | Admitting: Internal Medicine

## 2023-09-21 ENCOUNTER — Telehealth: Payer: Self-pay

## 2023-09-21 ENCOUNTER — Ambulatory Visit: Payer: Medicare HMO | Admitting: Family Medicine

## 2023-09-21 ENCOUNTER — Other Ambulatory Visit: Payer: Self-pay

## 2023-09-21 VITALS — BP 124/86 | HR 68 | Ht 71.0 in | Wt 275.0 lb

## 2023-09-21 DIAGNOSIS — M25512 Pain in left shoulder: Secondary | ICD-10-CM

## 2023-09-21 DIAGNOSIS — G8929 Other chronic pain: Secondary | ICD-10-CM | POA: Diagnosis not present

## 2023-09-21 DIAGNOSIS — M7062 Trochanteric bursitis, left hip: Secondary | ICD-10-CM

## 2023-09-21 NOTE — Patient Instructions (Addendum)
Thank you for coming in today.   You received an injection today. Seek immediate medical attention if the joint becomes red, extremely painful, or is oozing fluid.   Recommend checking with Dr Roda Shutters about that hip replacement.

## 2023-09-21 NOTE — Telephone Encounter (Signed)
Called the radiology reading room requesting on a read on the L-spine MRI from 11/13.

## 2023-09-21 NOTE — Progress Notes (Signed)
Charles Payor, PhD, LAT, ATC acting as a scribe for Charles Graham, MD.  Charles [name] is a 72 y.o. male who presents to [facility] today for L shoulder and L hip pain. Pt was last seen by Dr. Denyse Amass on 08/06/23 and was given a R GT steroid injection and a L-spine MRI was ordered. Last L subacromial and L GT steroid injections were on 04/29/23.  Today, pt reports L shoulder has returned. Irritated by L-side lying. L hip pain ongoing for months. He locates pain to the lateral aspect of his L hip.   Dx imaging: 09/02/23 L-spine MRI 06/04/21 R & L shoulder MRI             05/07/21 Bilat hips/pelvis & L-spine XR             03/12/21 R & L shoulder XR             01/22/21 Pelvis XR  Pertinent review of systems: No fevers or chills  Relevant historical information: Hypertension   Exam:  BP 124/86   Pulse 68   Ht 5\' 11"  (1.803 m)   Wt 275 lb (124.7 kg)   SpO2 98%   BMI 38.35 kg/m  General: Well Developed, well nourished, and in no acute distress.   MSK: Left shoulder: Normal-appearing Nontender. Normal motion pain with abduction.  Positive Hawkins and Neer's test.  Intact strength.  Left hip normal-appearing Tender palpation greater trochanter.  Normal hip motion pain with abduction.    Lab and Radiology Results  Procedure: Real-time Ultrasound Guided Injection of left shoulder subacromial bursa Device: Philips Affiniti 50G/GE Logiq Images permanently stored and available for review in PACS Verbal informed consent obtained.  Discussed risks and benefits of procedure. Warned about infection, bleeding, hyperglycemia damage to structures among others. Patient expresses understanding and agreement Time-out conducted.   Noted no overlying erythema, induration, or other signs of local infection.   Skin prepped in a sterile fashion.   Local anesthesia: Topical Ethyl chloride.   With sterile technique and under real time ultrasound guidance: 40 mg  of Kenalog and 2 ml of Marcaine injected into subacromial bursa. Fluid seen entering the bursa.   Completed without difficulty   Pain immediately resolved suggesting accurate placement of the medication.   Advised to call if fevers/chills, erythema, induration, drainage, or persistent bleeding.   Images permanently stored and available for review in the ultrasound unit.  Impression: Technically successful ultrasound guided injection.    Procedure: Real-time Ultrasound Guided Injection of left hip greater trochanter bursa Device: Philips Affiniti 50G/GE Logiq Images permanently stored and available for review in PACS Verbal informed consent obtained.  Discussed risks and benefits of procedure. Warned about infection, bleeding, hyperglycemia damage to structures among others. Patient expresses understanding and agreement Time-out conducted.   Noted no overlying erythema, induration, or other signs of local infection.   Skin prepped in a sterile fashion.   Local anesthesia: Topical Ethyl chloride.   With sterile technique and under real time ultrasound guidance: 40 mg of Kenalog and 2 ml of Marcaine injected into greater trochanter bursa. Fluid seen entering the bursa.   Completed without difficulty   Pain immediately resolved suggesting accurate placement of the medication.   Advised to call if fevers/chills, erythema, induration, drainage, or persistent bleeding.   Images permanently stored and available for review in the ultrasound unit.  Impression: Technically successful ultrasound guided injection.  Assessment and Plan: 71 y.o. male with recurrent exacerbation of chronic left shoulder pain thought to be due to subacromial bursitis and impingement.  Plan for steroid injection today and home exercise program.  Left lateral hip pain due to trochanteric bursitis.  This is again recurrence of her chronic problem.  Plan for steroid injection.  PDMP not reviewed this  encounter. Orders Placed This Encounter  Procedures   [place] LIMITED JOINT SPACE STRUCTURES UP LEFT(NO LINKED CHARGES)    Order Specific Question:   Reason for Exam (SYMPTOM  OR DIAGNOSIS REQUIRED)    Answer:   left shoulder pain    Order Specific Question:   Preferred imaging location?    Answer:   [facility]   No orders of the defined types were placed in this encounter.    Discussed warning signs or symptoms. Please see discharge instructions. Patient expresses understanding.   The above documentation has been reviewed and is accurate and complete Charles [name], M.D.

## 2023-09-22 DIAGNOSIS — Z8249 Family history of ischemic heart disease and other diseases of the circulatory system: Secondary | ICD-10-CM | POA: Diagnosis not present

## 2023-09-22 DIAGNOSIS — E785 Hyperlipidemia, unspecified: Secondary | ICD-10-CM | POA: Diagnosis not present

## 2023-09-22 DIAGNOSIS — K219 Gastro-esophageal reflux disease without esophagitis: Secondary | ICD-10-CM | POA: Diagnosis not present

## 2023-09-22 DIAGNOSIS — R7303 Prediabetes: Secondary | ICD-10-CM | POA: Diagnosis not present

## 2023-09-22 DIAGNOSIS — Z008 Encounter for other general examination: Secondary | ICD-10-CM | POA: Diagnosis not present

## 2023-09-22 DIAGNOSIS — M109 Gout, unspecified: Secondary | ICD-10-CM | POA: Diagnosis not present

## 2023-09-22 DIAGNOSIS — Z79890 Hormone replacement therapy (postmenopausal): Secondary | ICD-10-CM | POA: Diagnosis not present

## 2023-09-22 DIAGNOSIS — Z823 Family history of stroke: Secondary | ICD-10-CM | POA: Diagnosis not present

## 2023-09-22 DIAGNOSIS — M1990 Unspecified osteoarthritis, unspecified site: Secondary | ICD-10-CM | POA: Diagnosis not present

## 2023-09-22 DIAGNOSIS — I10 Essential (primary) hypertension: Secondary | ICD-10-CM | POA: Diagnosis not present

## 2023-09-22 DIAGNOSIS — Z87891 Personal history of nicotine dependence: Secondary | ICD-10-CM | POA: Diagnosis not present

## 2023-09-22 DIAGNOSIS — N400 Enlarged prostate without lower urinary tract symptoms: Secondary | ICD-10-CM | POA: Diagnosis not present

## 2023-09-22 NOTE — Progress Notes (Signed)
Lumbar spine MRI as we discussed in clinic shows areas of potential pinched nerves and a little bit of back arthritis.  If you have worsening back pain or pain going down your leg we could try some injections.

## 2023-09-26 ENCOUNTER — Other Ambulatory Visit: Payer: Self-pay | Admitting: Internal Medicine

## 2023-10-28 ENCOUNTER — Encounter: Payer: Self-pay | Admitting: Internal Medicine

## 2023-11-05 ENCOUNTER — Other Ambulatory Visit: Payer: Self-pay | Admitting: Internal Medicine

## 2023-11-06 NOTE — Telephone Encounter (Signed)
[facility] - please re-send.

## 2023-11-11 ENCOUNTER — Telehealth: Payer: Self-pay | Admitting: Internal Medicine

## 2023-11-11 NOTE — Telephone Encounter (Unsigned)
Copied from CRM [id number]. Topic: Clinical - Request for Lab/Test Order >> Nov 11, 2023 12:13 PM Theodis Sato wrote: Reason for CRM: Patient is requesting Dr. Lawerance Bach to put in an order for his routine lab work that he gets done every January - Patient is a requesting a call back when this is complete so that he can schedule his lab appointment.

## 2023-11-12 ENCOUNTER — Other Ambulatory Visit: Payer: Self-pay

## 2023-11-12 DIAGNOSIS — R7303 Prediabetes: Secondary | ICD-10-CM

## 2023-11-12 DIAGNOSIS — E349 Endocrine disorder, unspecified: Secondary | ICD-10-CM

## 2023-11-12 DIAGNOSIS — I10 Essential (primary) hypertension: Secondary | ICD-10-CM

## 2023-11-12 DIAGNOSIS — Z125 Encounter for screening for malignant neoplasm of prostate: Secondary | ICD-10-CM

## 2023-11-12 DIAGNOSIS — E038 Other specified hypothyroidism: Secondary | ICD-10-CM

## 2023-11-12 DIAGNOSIS — E782 Mixed hyperlipidemia: Secondary | ICD-10-CM

## 2023-11-12 NOTE — Patient Instructions (Addendum)
    Flu immunization administered today.     Blood work was ordered.       Medications changes include :   None    A Ct scan of your heart and a referral for surgery was ordered and someone will call you to schedule these appointments.     Return in about 6 months (around 05/12/2024) for Physical Exam.

## 2023-11-12 NOTE — Telephone Encounter (Signed)
LM for patient and sent him a my-chart message as well. He needs to schedule CPE with Dr. Lawerance Bach.  Lab orders placed today.

## 2023-11-12 NOTE — Progress Notes (Signed)
Subjective:    Patient ID: Charles [name], male    DOB: 11-Aug-1951, 73 y.o.   MRN: [id number]     HPI Charles [name] is here for follow up of his chronic medical problems.  Has colonoscopy scheduled  Would like to have R inguinal hernia repaired.   Will need hip replacement.  Wants flu vaccine  He knows he needs to lose weight.  He is limited in his exercise because of his severe hip arthritis.  He wonders about medication to help.  Medications and allergies reviewed with patient and updated if appropriate.  Current Outpatient Medications on File Prior to Visit  Medication Sig Dispense Refill   allopurinol (ZYLOPRIM) 100 MG tablet TAKE 2 TABLETS BY MOUTH EVERY DAY 180 tablet 1   colchicine 0.6 MG tablet TAKE 1 TABLET BY MOUTH EVERY DAY 90 tablet 1   ezetimibe (ZETIA) 10 MG tablet TAKE 1 TABLET BY MOUTH EVERY DAY 90 tablet 3   levothyroxine (SYNTHROID) 200 MCG tablet TAKE 1 TABLET BY MOUTH EVERY DAY 90 tablet 3   levothyroxine (SYNTHROID) 25 MCG tablet Take 1 tablet (25 mcg total) by mouth daily. In addition to 200 mcg pill for total of 225 mcg daily 90 tablet 3   omeprazole (PRILOSEC) 20 MG capsule Take 20 mg by mouth daily.     tadalafil (CIALIS) 20 MG tablet TAKE ONE HALF OF TABLET TO ONE TABLET EVERY 3 DAYS AS NEEDED. (Patient taking differently: Take 10-20 mg by mouth daily as needed for erectile dysfunction.) 10 tablet 8   Testosterone 20.25 MG/ACT (1.62%) GEL APPLY 4 PUMPS DAILY TO UPPER ARM OR CHEST DAILY 150 g 0   tiZANidine (ZANAFLEX) 4 MG tablet Take 1 tablet (4 mg total) by mouth every 8 (eight) hours as needed. 30 tablet 1   No current facility-administered medications on file prior to visit.     Review of Systems  Constitutional:  Negative for fever.  Respiratory:  Positive for shortness of breath (mild sob). Negative for cough and wheezing.   Cardiovascular:  Negative for chest pain, palpitations and leg swelling.  Musculoskeletal:  Positive for arthralgias.   Neurological:  Negative for light-headedness, numbness and headaches.       Objective:   Vitals:   11/13/23 0830  BP: 120/88  Pulse: 61  Temp: 98 F (36.7 C)  SpO2: 98%   BP Readings from Last 3 Encounters:  11/13/23 120/88  09/21/23 124/86  08/06/23 124/86   Wt Readings from Last 3 Encounters:  11/13/23 270 lb 6.4 oz (122.7 kg)  09/21/23 275 lb (124.7 kg)  08/12/23 268 lb (121.6 kg)   Body mass index is 37.71 kg/m.    Physical Exam Constitutional:      General: He is not in acute distress.    Appearance: Normal appearance. He is not ill-appearing.  HENT:     Head: Normocephalic and atraumatic.  Eyes:     Conjunctiva/sclera: Conjunctivae normal.  Cardiovascular:     Rate and Rhythm: Normal rate and regular rhythm.     Heart sounds: Normal heart sounds.  Pulmonary:     Effort: Pulmonary effort is normal. No respiratory distress.     Breath sounds: Normal breath sounds. No wheezing or rales.  Musculoskeletal:     Right lower leg: No edema.     Left lower leg: No edema.  Skin:    General: Skin is warm and dry.     Findings: No rash.  Neurological:  Mental Status: He is alert. Mental status is at baseline.  Psychiatric:        Mood and Affect: Mood normal.        Lab Results  Component Value Date   WBC 8.7 11/13/2023   HGB 16.0 11/13/2023   HCT 46.5 11/13/2023   PLT 253.0 11/13/2023   GLUCOSE 102 (H) 11/13/2023   CHOL 191 11/13/2023   TRIG 156.0 (H) 11/13/2023   HDL 44.60 11/13/2023   LDLDIRECT 124.0 07/11/2019   LDLCALC 116 (H) 11/13/2023   ALT 24 11/13/2023   AST 22 11/13/2023   NA 139 11/13/2023   K 4.3 11/13/2023   CL 103 11/13/2023   CREATININE 1.23 11/13/2023   BUN 16 11/13/2023   CO2 26 11/13/2023   TSH 0.44 11/13/2023   PSA 0.43 11/13/2023   HGBA1C 6.0 11/13/2023     Assessment & Plan:    See Problem List for Assessment and Plan of chronic medical problems.   Flu vaccine today.

## 2023-11-13 ENCOUNTER — Encounter: Payer: Self-pay | Admitting: Internal Medicine

## 2023-11-13 ENCOUNTER — Ambulatory Visit (INDEPENDENT_AMBULATORY_CARE_PROVIDER_SITE_OTHER): Payer: Medicare HMO | Admitting: Internal Medicine

## 2023-11-13 VITALS — BP 120/88 | HR 61 | Temp 98.0°F | Ht 71.0 in | Wt 270.4 lb

## 2023-11-13 DIAGNOSIS — T466X5A Adverse effect of antihyperlipidemic and antiarteriosclerotic drugs, initial encounter: Secondary | ICD-10-CM

## 2023-11-13 DIAGNOSIS — E66812 Obesity, class 2: Secondary | ICD-10-CM

## 2023-11-13 DIAGNOSIS — E349 Endocrine disorder, unspecified: Secondary | ICD-10-CM

## 2023-11-13 DIAGNOSIS — E782 Mixed hyperlipidemia: Secondary | ICD-10-CM | POA: Diagnosis not present

## 2023-11-13 DIAGNOSIS — M609 Myositis, unspecified: Secondary | ICD-10-CM | POA: Diagnosis not present

## 2023-11-13 DIAGNOSIS — Z6838 Body mass index (BMI) 38.0-38.9, adult: Secondary | ICD-10-CM

## 2023-11-13 DIAGNOSIS — M10079 Idiopathic gout, unspecified ankle and foot: Secondary | ICD-10-CM

## 2023-11-13 DIAGNOSIS — Z125 Encounter for screening for malignant neoplasm of prostate: Secondary | ICD-10-CM

## 2023-11-13 DIAGNOSIS — M150 Primary generalized (osteo)arthritis: Secondary | ICD-10-CM | POA: Diagnosis not present

## 2023-11-13 DIAGNOSIS — I10 Essential (primary) hypertension: Secondary | ICD-10-CM

## 2023-11-13 DIAGNOSIS — K4090 Unilateral inguinal hernia, without obstruction or gangrene, not specified as recurrent: Secondary | ICD-10-CM

## 2023-11-13 DIAGNOSIS — R7303 Prediabetes: Secondary | ICD-10-CM

## 2023-11-13 DIAGNOSIS — I2510 Atherosclerotic heart disease of native coronary artery without angina pectoris: Secondary | ICD-10-CM

## 2023-11-13 DIAGNOSIS — E038 Other specified hypothyroidism: Secondary | ICD-10-CM | POA: Diagnosis not present

## 2023-11-13 DIAGNOSIS — Z23 Encounter for immunization: Secondary | ICD-10-CM | POA: Diagnosis not present

## 2023-11-13 DIAGNOSIS — Z136 Encounter for screening for cardiovascular disorders: Secondary | ICD-10-CM

## 2023-11-13 LAB — COMPREHENSIVE METABOLIC PANEL
ALT: 24 U/L (ref 0–53)
AST: 22 U/L (ref 0–37)
Albumin: 4.4 g/dL (ref 3.5–5.2)
Alkaline Phosphatase: 72 U/L (ref 39–117)
BUN: 16 mg/dL (ref 6–23)
CO2: 26 meq/L (ref 19–32)
Calcium: 9.3 mg/dL (ref 8.4–10.5)
Chloride: 103 meq/L (ref 96–112)
Creatinine, Ser: 1.23 mg/dL (ref 0.40–1.50)
GFR: 59.18 mL/min — ABNORMAL LOW (ref >60.00)
Glucose, Bld: 102 mg/dL — ABNORMAL HIGH (ref 70–99)
Potassium: 4.3 meq/L (ref 3.5–5.1)
Sodium: 139 meq/L (ref 135–145)
Total Bilirubin: 0.7 mg/dL (ref 0.2–1.2)
Total Protein: 7.2 g/dL (ref 6.0–8.3)

## 2023-11-13 LAB — LIPID PANEL
Cholesterol: 191 mg/dL (ref 0–200)
HDL: 44.6 mg/dL (ref >39.00)
LDL Cholesterol: 116 mg/dL — ABNORMAL HIGH (ref 0–99)
NonHDL: 146.74
Total CHOL/HDL Ratio: 4
Triglycerides: 156 mg/dL — ABNORMAL HIGH (ref 0.0–149.0)
VLDL: 31.2 mg/dL (ref 0.0–40.0)

## 2023-11-13 LAB — CBC WITH DIFFERENTIAL/PLATELET
Basophils Absolute: 0.1 10*3/uL (ref 0.0–0.1)
Basophils Relative: 1.3 % (ref 0.0–3.0)
Eosinophils Absolute: 0.4 10*3/uL (ref 0.0–0.7)
Eosinophils Relative: 4.1 % (ref 0.0–5.0)
HCT: 46.5 % (ref 39.0–52.0)
Hemoglobin: 16 g/dL (ref 13.0–17.0)
Lymphocytes Relative: 29.9 % (ref 12.0–46.0)
Lymphs Abs: 2.6 10*3/uL (ref 0.7–4.0)
MCHC: 34.4 g/dL (ref 30.0–36.0)
MCV: 94.9 fL (ref 78.0–100.0)
Monocytes Absolute: 0.6 10*3/uL (ref 0.1–1.0)
Monocytes Relative: 6.7 % (ref 3.0–12.0)
Neutro Abs: 5.1 10*3/uL (ref 1.4–7.7)
Neutrophils Relative %: 58 % (ref 43.0–77.0)
Platelets: 253 10*3/uL (ref 150.0–400.0)
RBC: 4.9 Mil/uL (ref 4.22–5.81)
RDW: 13.9 % (ref 11.5–15.5)
WBC: 8.7 10*3/uL (ref 4.0–10.5)

## 2023-11-13 LAB — TSH: TSH: 0.44 u[IU]/mL (ref 0.35–5.50)

## 2023-11-13 LAB — URIC ACID: Uric Acid, Serum: 6.5 mg/dL (ref 4.0–7.8)

## 2023-11-13 LAB — HEMOGLOBIN A1C: Hgb A1c MFr Bld: 6 % (ref 4.6–6.5)

## 2023-11-13 LAB — PSA: PSA: 0.43 ng/mL (ref 0.10–4.00)

## 2023-11-13 NOTE — Assessment & Plan Note (Addendum)
Chronic Recommended weight loss and he agrees he does want to lose weight-he knows that we will help several things Decrease portions Advised diet high in protein and vegetables, low in sugars and carbs Continue to be active Depending on results of A1c and CT CAC will consider Ozempic.-Discussed at length that is not a long-term fix and that he needs to work on his eating habits and activity level

## 2023-11-13 NOTE — Assessment & Plan Note (Signed)
Chronic Does not tolerate statins-had myalgias with Lipitor and Crestor Currently on Zetia

## 2023-11-13 NOTE — Assessment & Plan Note (Signed)
Chronic Lab Results  Component Value Date   HGBA1C 6.4 05/18/2023   Check a1c Low sugar / carb diet Stressed regular exercise Encouraged weight loss

## 2023-11-13 NOTE — Assessment & Plan Note (Signed)
Chronic Hands, knees, shoulders Takes ibuprofen as needed-not on a daily basis

## 2023-11-13 NOTE — Assessment & Plan Note (Addendum)
Chronic Taking allopurinol 200 mg daily Check uric acid level

## 2023-11-13 NOTE — Assessment & Plan Note (Addendum)
Chronic Blood pressure with borderline high DBP  Advised monitoring BP at home At 1 point was on lisinopril Continue to monitor off medication We are going to make efforts with weight loss and that will likely help

## 2023-11-13 NOTE — Assessment & Plan Note (Signed)
Chronic Doing well with testosterone replacement Check testosterone, CBC Continue testosterone 4 pumps daily-he is currently paying for this out-of-pocket because is not covered by insurance

## 2023-11-13 NOTE — Assessment & Plan Note (Signed)
Chronic  Clinically euthyroid Last TSH was elevated and medication was adjusted Check tsh and will titrate med dose if needed Currently taking levothyroxine 225 mcg daily

## 2023-11-13 NOTE — Assessment & Plan Note (Addendum)
Chronic Statin intolerant-myalgias-Lipitor and Crestor Regular exercise and healthy diet encouraged Check lipid panel  Continue Zetia 10 mg daily Discussed coronary artery Ct scan to evaluate plaque in arteries-he agreed and this was ordered

## 2023-11-15 ENCOUNTER — Encounter: Payer: Self-pay | Admitting: Internal Medicine

## 2023-11-20 LAB — TESTOSTERONE, FREE & TOTAL
Free Testosterone: 42.3 pg/mL (ref 30.0–135.0)
Testosterone, Total, LC-MS-MS: 345 ng/dL (ref 250–1100)

## 2023-11-25 ENCOUNTER — Ambulatory Visit (HOSPITAL_COMMUNITY)
Admission: RE | Admit: 2023-11-25 | Discharge: 2023-11-25 | Disposition: A | Discharge status: Home / Self Care | Payer: Self-pay | Source: Ambulatory Visit | Attending: Internal Medicine | Admitting: Internal Medicine

## 2023-11-25 DIAGNOSIS — Z136 Encounter for screening for cardiovascular disorders: Secondary | ICD-10-CM | POA: Insufficient documentation

## 2023-11-25 MED ORDER — CANGRELOR TETRASODIUM 50 MG IV SOLR
INTRAVENOUS | Status: AC
  Filled 2023-11-25: qty 50

## 2023-11-25 MED ORDER — NITROGLYCERIN 1 MG/10 ML FOR IR/CATH LAB
INTRA_ARTERIAL | Status: AC
  Filled 2023-11-25: qty 10

## 2023-11-26 ENCOUNTER — Other Ambulatory Visit: Payer: Self-pay | Admitting: Internal Medicine

## 2023-11-26 DIAGNOSIS — I2510 Atherosclerotic heart disease of native coronary artery without angina pectoris: Secondary | ICD-10-CM

## 2023-11-26 DIAGNOSIS — E782 Mixed hyperlipidemia: Secondary | ICD-10-CM

## 2023-11-26 MED ORDER — REPATHA SURECLICK 140 MG/ML ~~LOC~~ SOAJ: 140.0000 mg | SUBCUTANEOUS | 5 refills | Status: DC

## 2023-11-30 ENCOUNTER — Telehealth: Payer: Self-pay

## 2023-11-30 ENCOUNTER — Telehealth: Payer: Self-pay | Admitting: Internal Medicine

## 2023-11-30 ENCOUNTER — Other Ambulatory Visit (HOSPITAL_COMMUNITY): Payer: Self-pay

## 2023-11-30 NOTE — Telephone Encounter (Signed)
 Pt's ins states he needs a PA for the Evolocumab  (REPATHA  SURECLICK) 140 MG/ML SOAJ. Please start PA asap.

## 2023-11-30 NOTE — Progress Notes (Signed)
 Care Guide Pharmacy Note  11/30/2023 Name: Charles [name] MRN: [id number] DOB: July 13, 1951  Referred By: Charles Dauphin, MD Reason for referral: Care Coordination (Outreach to schedule with Pharm d )   Charles [name] is a 73 y.o. year old male who is a primary care patient of Charles Dauphin, MD.  Charles [name] was referred to the pharmacist for assistance related to: HLD  Successful contact was made with the patient to discuss pharmacy services including being ready for the pharmacist to call at least 5 minutes before the scheduled appointment time and to have medication bottles and any blood pressure readings ready for review. The patient agreed to meet with the pharmacist via telephone visit on (date/time).12/14/2023  Lenton Rail , RMA     [place]  [facility], [facility] Guide  Direct Dial: [phone]  Website: [email or web address]

## 2023-11-30 NOTE — Telephone Encounter (Signed)
 Pharmacy Patient Advocate Encounter  Received notification from [facility] that Prior Authorization for Repatha  Sureclick 140mg /ml has been APPROVED from 11/30/23 to 10/19/24   PA #/Case ID/Reference #:  [id number]

## 2023-11-30 NOTE — Telephone Encounter (Signed)
 Pharmacy Patient Advocate Encounter   Received notification from Pt Calls Messages that prior authorization for Repatha  Sureclick is required/requested.   Insurance verification completed.   The patient is insured through [facility] .   Per test claim: PA required; PA submitted to above mentioned insurance via CoverMyMeds Key/confirmation #/EOC [id number] Status is pending

## 2023-12-01 NOTE — Telephone Encounter (Signed)
Patient notified via my-chart of approval.

## 2023-12-02 ENCOUNTER — Other Ambulatory Visit: Payer: Self-pay | Admitting: Internal Medicine

## 2023-12-03 ENCOUNTER — Other Ambulatory Visit: Payer: Self-pay | Admitting: General Surgery

## 2023-12-03 ENCOUNTER — Ambulatory Visit (AMBULATORY_SURGERY_CENTER): Payer: Medicare HMO | Admitting: *Deleted

## 2023-12-03 VITALS — Ht 71.0 in | Wt 260.0 lb

## 2023-12-03 DIAGNOSIS — N50811 Right testicular pain: Secondary | ICD-10-CM | POA: Diagnosis not present

## 2023-12-03 DIAGNOSIS — Z860100 Personal history of colon polyps, unspecified: Secondary | ICD-10-CM

## 2023-12-03 MED ORDER — SUFLAVE 178.7 G PO SOLR: 1.0000 | Freq: Once | ORAL | 0 refills | Status: AC

## 2023-12-03 NOTE — Progress Notes (Signed)
Pt's name and DOB verified at the beginning of the pre-visit wit 2 identifiers  Permission given to speak with  Pt denies any difficulty with ambulating,sitting, laying down or rolling side to side  Pt has no issues with ambulation   Pt has no issues moving head neck or swallowing  No egg or soy allergy known to patient   No issues known to pt with past sedation with any surgeries or procedures  Pt denies having issues being intubated  No FH of Malignant Hyperthermia  Pt is not on diet pills or shots  Pt is not on home 02   Pt is not on blood thinners   Pt denies issues with constipation   Pt is not on dialysis  Pt denise any abnormal heart rhythms   Pt denies any upcoming cardiac testing  Patient's chart reviewed by Cathlyn Parsons CNRA prior to pre-visit and patient appropriate for the [facility].  Pre-visit completed and red dot placed by patient's name on their procedure day (on provider's schedule).    Chart not reviewed by CRNA prior to [facility]  Visit by phone   Pt states weight is 260 lb   IInstructions reviewed. Pt given [facility], [facility] main # and MD on call # prior to instructions.  Pt states understanding of instructions. Instructed to review again prior to procedure. Pt states they will.   Informed pt that they will receive a text or  call from [facility] regarding there prep med.

## 2023-12-05 ENCOUNTER — Encounter: Payer: Self-pay | Admitting: Internal Medicine

## 2023-12-07 ENCOUNTER — Telehealth: Payer: Self-pay | Admitting: Internal Medicine

## 2023-12-07 DIAGNOSIS — I2510 Atherosclerotic heart disease of native coronary artery without angina pectoris: Secondary | ICD-10-CM

## 2023-12-07 DIAGNOSIS — E782 Mixed hyperlipidemia: Secondary | ICD-10-CM

## 2023-12-07 NOTE — Telephone Encounter (Signed)
Copied from CRM [id number]. Topic: Clinical - Prescription Issue >> Dec 07, 2023 10:01 AM Drema Balzarine wrote: Reason for CRM: Patient called regarding the Repatha, says its too expensive and was checking the status of someone helping him get it for a longer price?

## 2023-12-09 NOTE — Telephone Encounter (Signed)
Please let him know referral was ordered for the cholesterol clinic-someone will call him to schedule an appointment and they will help to determine other options.

## 2023-12-09 NOTE — Addendum Note (Signed)
Addended by: Pincus Sanes on: 12/09/2023 02:01 PM   Modules accepted: Orders

## 2023-12-10 ENCOUNTER — Encounter: Payer: Self-pay | Admitting: Internal Medicine

## 2023-12-10 ENCOUNTER — Other Ambulatory Visit: Payer: Self-pay | Admitting: Internal Medicine

## 2023-12-10 NOTE — Telephone Encounter (Signed)
Spoke with patient today and info given. 

## 2023-12-11 ENCOUNTER — Telehealth: Payer: Self-pay | Admitting: Internal Medicine

## 2023-12-11 ENCOUNTER — Other Ambulatory Visit: Payer: Self-pay | Admitting: Internal Medicine

## 2023-12-11 NOTE — Telephone Encounter (Signed)
Copied from CRM [id number]. Topic: Clinical - Prescription Issue >> Dec 11, 2023  3:20 PM Sonny Dandy B wrote: Reason for CRM: pt called requesting provider to  call in Testosterone 20.25 MG/ACT (1.62%) GEL [facility]

## 2023-12-11 NOTE — Telephone Encounter (Signed)
Patient was able to pick up script today.

## 2023-12-11 NOTE — Telephone Encounter (Signed)
Copied from CRM [id number]. Topic: Clinical - Medication Refill >> Dec 11, 2023  3:18 PM Sonny Dandy B wrote: Most Recent Primary Care Visit:  Provider: BURNS, Bobette Mo  Department: [facility]  Visit Type: OFFICE VISIT  Date: 11/13/2023  Medication: Testosterone 20.25 MG/ACT (1.62%) GEL  Has the patient contacted their pharmacy? Yes (Agent: If no, request that the patient contact the pharmacy for the refill. If patient does not wish to contact the pharmacy document the reason why and proceed with request.) (Agent: If yes, when and what did the pharmacy advise?)  Is this the correct pharmacy for this prescription? Yes If no, delete pharmacy and type the correct one.  This is the patient's preferred pharmacy:  [id number] [place] [place] - [address] [place] [address] Phone: [phone] Fax: [phone]     Has the prescription been filled recently? Yes  Is the patient out of the medication? Yes  Has the patient been seen for an appointment in the last year OR does the patient have an upcoming appointment? Yes  Can we respond through MyChart? Yes  Agent: Please be advised that Rx refills may take up to 3 business days. We ask that you follow-up with your pharmacy.

## 2023-12-14 ENCOUNTER — Other Ambulatory Visit (INDEPENDENT_AMBULATORY_CARE_PROVIDER_SITE_OTHER): Payer: Medicare HMO | Admitting: Pharmacist

## 2023-12-14 DIAGNOSIS — I2510 Atherosclerotic heart disease of native coronary artery without angina pectoris: Secondary | ICD-10-CM

## 2023-12-14 DIAGNOSIS — E782 Mixed hyperlipidemia: Secondary | ICD-10-CM

## 2023-12-14 NOTE — Patient Instructions (Signed)
 It was a pleasure speaking with you today!  [facility] with the needed information for your [facility]. Repatha will be $0.  Feel free to call with any questions or concerns!  Arbutus Leas, PharmD, BCPS, CPP Clinical Pharmacist Practitioner [facility] [phone]

## 2023-12-14 NOTE — Progress Notes (Signed)
 12/14/2023 Name: Charles [name] MRN: [id number] DOB: August 12, 1951  Chief Complaint  Patient presents with   Repatha Med Access    Charles [name] is a 73 y.o. year old male who presented for a telephone visit.   They were referred to the pharmacist by their PCP for assistance in managing medication access.    Subjective:  Care Team: Primary Care Provider: Pincus Sanes, MD ; Next Scheduled Visit: 05/12/24  Medication Access/Adherence  Current Pharmacy:  [id number] G[place] Otto, [place] - [address] [place] [address] Phone: [phone] Fax: [phone]  [facility] - [place], [place] - [place] [address] [place] [address] Phone: [phone] Fax: [phone]   Patient reports affordability concerns with their medications: Yes  Patient reports access/transportation concerns to their pharmacy: No  Patient reports adherence concerns with their medications:  No     Hyperlipidemia/ASCVD Risk Reduction  Current lipid lowering medications: ezetimibe 10 mg daily Medications tried in the past: atorvastatin and rosuvastatin - muscle ahes  Was prescribed Repatha and is unable to afford ($432 per 90DS)  Objective:  Lab Results  Component Value Date   HGBA1C 6.0 11/13/2023    Lab Results  Component Value Date   CREATININE 1.23 11/13/2023   BUN 16 11/13/2023   NA 139 11/13/2023   K 4.3 11/13/2023   CL 103 11/13/2023   CO2 26 11/13/2023    Lab Results  Component Value Date   CHOL 191 11/13/2023   HDL 44.60 11/13/2023   LDLCALC 116 (H) 11/13/2023   LDLDIRECT 124.0 07/11/2019   TRIG 156.0 (H) 11/13/2023   CHOLHDL 4 11/13/2023    Medications Reviewed Today     Reviewed by Bonita Quin, RPH (Pharmacist) on 12/14/23 at 1446  Med List Status: <None>   Medication Order Taking? Sig Documenting Provider Last Dose Status Informant  colchicine 0.6 MG tablet  [id number]  TAKE 1 TABLET BY MOUTH EVERY DAY Antoine Primas M, DO  Active   Evolocumab (REPATHA SURECLICK) 140 MG/ML SOAJ [id number] No Inject 140 mg into the skin every 14 (fourteen) days.  Patient not taking: Reported on 12/14/2023   Pincus Sanes, MD Not Taking Active   ezetimibe (ZETIA) 10 MG tablet [id number]  TAKE 1 TABLET BY MOUTH EVERY DAY Lawerance Bach Bobette Mo, MD  Active   levothyroxine (SYNTHROID) 200 MCG tablet [id number]  TAKE 1 TABLET BY MOUTH EVERY DAY Burns, Bobette Mo, MD  Active   levothyroxine (SYNTHROID) 25 MCG tablet [id number]  TAKE 1 TABLET BY MOUTH DAILY IN ADDITION TO 200 MCG TABLET FOR TOTAL OF 225 MCG DAILY Burns, Bobette Mo, MD  Active   omeprazole (PRILOSEC) 20 MG capsule [id number]  Take 20 mg by mouth daily. [provider]  Active Self  tadalafil (CIALIS) 20 MG tablet [id number]  TAKE ONE HALF OF TABLET TO ONE TABLET EVERY 3 DAYS AS NEEDED.  Patient taking differently: Take 10-20 mg by mouth daily as needed for erectile dysfunction.   Pincus Sanes, MD  Active   Testosterone 20.25 MG/ACT (1.62%) GEL [id number]  APPLY 4 PUMPS DAILY TO UPPER ARM OR CHEST DAILY Burns, Bobette Mo, MD  Active   tiZANidine (ZANAFLEX) 4 MG tablet [id number]  Take 1 tablet (4 mg total) by mouth every 8 (eight) hours as needed.  Patient not taking: Reported on 12/03/2023   Rodolph Bong, MD  Active  Assessment/Plan:   Hyperlipidemia/ASCVD Risk Reduction: - Currently uncontrolled. LDL goal <70 - Enrolled patient in [employer] hypercholesterolemia grant. Provided information to the pharmacy - Pt may call back to set up education on giving the injection once he receives, provided direct callback number   Follow Up Plan: PRN  Arbutus Leas, PharmD, BCPS, CPP Clinical Pharmacist Practitioner [facility] [phone]

## 2023-12-15 ENCOUNTER — Ambulatory Visit (AMBULATORY_SURGERY_CENTER): Payer: Medicare HMO | Admitting: Internal Medicine

## 2023-12-15 ENCOUNTER — Encounter: Payer: Self-pay | Admitting: Internal Medicine

## 2023-12-15 VITALS — BP 121/90 | HR 74 | Temp 98.3°F | Resp 21 | Ht 71.0 in | Wt 260.0 lb

## 2023-12-15 DIAGNOSIS — K5520 Angiodysplasia of colon without hemorrhage: Secondary | ICD-10-CM | POA: Diagnosis not present

## 2023-12-15 DIAGNOSIS — Z860100 Personal history of colon polyps, unspecified: Secondary | ICD-10-CM

## 2023-12-15 DIAGNOSIS — E039 Hypothyroidism, unspecified: Secondary | ICD-10-CM | POA: Diagnosis not present

## 2023-12-15 DIAGNOSIS — Z1211 Encounter for screening for malignant neoplasm of colon: Secondary | ICD-10-CM | POA: Diagnosis not present

## 2023-12-15 DIAGNOSIS — E785 Hyperlipidemia, unspecified: Secondary | ICD-10-CM | POA: Diagnosis not present

## 2023-12-15 DIAGNOSIS — K5730 Diverticulosis of large intestine without perforation or abscess without bleeding: Secondary | ICD-10-CM

## 2023-12-15 DIAGNOSIS — K648 Other hemorrhoids: Secondary | ICD-10-CM | POA: Diagnosis not present

## 2023-12-15 DIAGNOSIS — Z860101 Personal history of adenomatous and serrated colon polyps: Secondary | ICD-10-CM | POA: Diagnosis not present

## 2023-12-15 MED ORDER — SODIUM CHLORIDE 0.9 % IV SOLN: 500.0000 mL | Freq: Once | INTRAVENOUS | Status: DC

## 2023-12-15 NOTE — Progress Notes (Signed)
 Vss nad trans to pacu

## 2023-12-15 NOTE — Op Note (Signed)
 [facility] Patient Name: Charles [name] Procedure Date: 12/15/2023 11:17 AM MRN: [id number] Endoscopist: Wilhemina Bonito. Marina Goodell , MD, [id number] Age: 73 Referring MD:  Date of Birth: December 23, 1950 Gender: Male Account #: [id number] Procedure:                Colonoscopy Indications:              High risk colon cancer surveillance: Personal                            history of non-advanced adenoma. Previous                            examinations 2006, 2017 Medicines:                Monitored Anesthesia Care Procedure:                Pre-Anesthesia Assessment:                           - Prior to the procedure, a History and Physical                            was performed, and patient medications and                            allergies were reviewed. The patient's tolerance of                            previous anesthesia was also reviewed. The risks                            and benefits of the procedure and the sedation                            options and risks were discussed with the patient.                            All questions were answered, and informed consent                            was obtained. Prior Anticoagulants: The patient has                            taken no anticoagulant or antiplatelet agents. ASA                            Grade Assessment: II - A patient with mild systemic                            disease. After reviewing the risks and benefits,                            the patient was deemed in satisfactory condition to  undergo the procedure.                           After obtaining informed consent, the colonoscope                            was passed under direct vision. Throughout the                            procedure, the patient's blood pressure, pulse, and                            oxygen saturations were monitored continuously. The                            CF [id number] #[id number] was introduced through  the anus                            and advanced to the the cecum, identified by                            appendiceal orifice and ileocecal valve. The                            ileocecal valve, appendiceal orifice, and rectum                            were photographed. The quality of the bowel                            preparation was excellent. The colonoscopy was                            performed without difficulty. The patient tolerated                            the procedure well. The bowel preparation used was                            SUPREP via split dose instruction. Scope In: 11:24:24 AM Scope Out: 11:34:39 AM Scope Withdrawal Time: 0 hours 8 minutes 50 seconds  Total Procedure Duration: 0 hours 10 minutes 15 seconds  Findings:                 A few small-mouthed diverticula were found in the                            sigmoid colon. Few nonbleeding AVMs in right colon.                            See image.                           The exam was otherwise without abnormality on  direct and retroflexion views. Small internal                            hemorrhoids. Complications:            No immediate complications. Estimated blood loss:                            None. Estimated Blood Loss:     Estimated blood loss: none. Impression:               - Diverticulosis in the sigmoid colon. Right sided                            AVMs. Internal hemorrhoids.                           - The examination was otherwise normal on direct                            and retroflexion views.                           - No specimens collected. Recommendation:           - Repeat colonoscopy is not recommended for                            surveillance.                           - Patient has a contact number available for                            emergencies. The signs and symptoms of potential                            delayed complications were  discussed with the                            patient. Return to normal activities tomorrow.                            Written discharge instructions were provided to the                            patient.                           - Resume previous diet.                           - Continue present medications. Wilhemina Bonito. Marina Goodell, MD 12/15/2023 11:38:40 AM This report has been signed electronically.

## 2023-12-15 NOTE — Patient Instructions (Signed)
 Handout provided on diverticulosis.  Resume previous diet.  Continue present medications.  Repeat colonoscopy is not recommended for surveillance.   YOU HAD AN ENDOSCOPIC PROCEDURE TODAY AT THE [facility]:   Refer to the procedure report that was given to you for any specific questions about what was found during the examination.  If the procedure report does not answer your questions, please call your gastroenterologist to clarify.  If you requested that your care partner not be given the details of your procedure findings, then the procedure report has been included in a sealed envelope for you to review at your convenience later.  YOU SHOULD EXPECT: Some feelings of bloating in the abdomen. Passage of more gas than usual.  Walking can help get rid of the air that was put into your GI tract during the procedure and reduce the bloating. If you had a lower endoscopy (such as a colonoscopy or flexible sigmoidoscopy) you may notice spotting of blood in your stool or on the toilet paper. If you underwent a bowel prep for your procedure, you may not have a normal bowel movement for a few days.  Please Note:  You might notice some irritation and congestion in your nose or some drainage.  This is from the oxygen used during your procedure.  There is no need for concern and it should clear up in a day or so.  SYMPTOMS TO REPORT IMMEDIATELY:  Following lower endoscopy (colonoscopy or flexible sigmoidoscopy):  Excessive amounts of blood in the stool  Significant tenderness or worsening of abdominal pains  Swelling of the abdomen that is new, acute  Fever of 100F or higher  For urgent or emergent issues, a gastroenterologist can be reached at any hour by calling [phone]. Do not use MyChart messaging for urgent concerns.    DIET:  We do recommend a small meal at first, but then you may proceed to your regular diet.  Drink plenty of fluids but you should avoid alcoholic beverages for  24 hours.  ACTIVITY:  You should plan to take it easy for the rest of today and you should NOT DRIVE or use heavy machinery until tomorrow (because of the sedation medicines used during the test).    FOLLOW UP: Our staff will call the number listed on your records the next business day following your procedure.  We will call around 7:15- 8:00 am to check on you and address any questions or concerns that you may have regarding the information given to you following your procedure. If we do not reach you, we will leave a message.     If any biopsies were taken you will be contacted by phone or by letter within the next 1-3 weeks.  Please call us at [phone] if you have not heard about the biopsies in 3 weeks.    SIGNATURES/CONFIDENTIALITY: You and/or your care partner have signed paperwork which will be entered into your electronic medical record.  These signatures attest to the fact that that the information above on your After Visit Summary has been reviewed and is understood.  Full responsibility of the confidentiality of this discharge information lies with you and/or your care-partner.
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
## 2023-12-16 ENCOUNTER — Telehealth: Payer: Self-pay

## 2023-12-16 NOTE — Telephone Encounter (Signed)
 Attempted to reach patient for post-procedure f/u call. No answer. Left message for him to please not hesitate to call if he has any questions/concerns regarding his care.

## 2023-12-30 ENCOUNTER — Ambulatory Visit
Admission: RE | Admit: 2023-12-30 | Discharge: 2023-12-30 | Disposition: A | Discharge status: Home / Self Care | Payer: Medicare HMO | Source: Ambulatory Visit | Attending: General Surgery | Admitting: General Surgery

## 2023-12-30 DIAGNOSIS — N50811 Right testicular pain: Secondary | ICD-10-CM

## 2023-12-30 DIAGNOSIS — I898 Other specified noninfective disorders of lymphatic vessels and lymph nodes: Secondary | ICD-10-CM | POA: Diagnosis not present

## 2023-12-30 DIAGNOSIS — I700 Atherosclerosis of aorta: Secondary | ICD-10-CM | POA: Diagnosis not present

## 2023-12-31 ENCOUNTER — Encounter: Payer: Self-pay | Admitting: General Surgery

## 2024-01-05 NOTE — Progress Notes (Unsigned)
 Cardiology Office Note:  .   Date:  01/06/2024 ID:  Charles [name], DOB 08/11/1951, MRN [id number] PCP: Pincus Sanes, MD [facility] Providers Cardiologist:  None   Patient Profile: .      PMH Hyperlipidemia Coronary artery disease CT calcium score 11/25/23 CAC score 1890 (92nd percentile) LM 0, LAD 489, LCx 270, RCA 1133 Thoracic aortic aneurysm Hypertension Prediabetes  Statin intolerance (atorvastatin and rosuvastatin) Former tobacco abuse Quit in 1983       History of Present Illness: .   Charles [name] is a very pleasant 73 y.o. male  who is here today for new patient consult for elevated coronary calcium score. 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  He does not exercise on a consistent basis.  He has been having shortness of breath recently, most noticeable recently with having to chase his dog when he ran away from him. No associated symptoms of chest pain, palpitations, presyncope, diaphoresis, or n/v.  Activity is somewhat limited by hip pain, but he has done some yard work recently.  He has been told he needs a hip replacement.  He walks around [place] home improvement store but admits he feels better if he uses a cart due to SOB and hip pain.  He does not have shortness of breath at rest.  He denies orthopnea and PND. Has mild bilateral LE edema, right > left.  History of hypertension but stopped taking antihypertensive medication last year because SBP was 75 and he felt very fatigued.  He follows BP at home and reports it is consistently 130s over 80s with occasional elevations to 150.  He was started on Repatha for elevated cholesterol by PCP and took first dose on 12/29/2023.  He reports no adverse side effects. Family history significant for stroke and heart disease in one sister. Admits to dietary indiscretion with soda, oatmeal cream pies, sausage, and raisin bran.   Family history: His  family history includes Breast cancer in his mother and sister; Dementia in his father; Diabetes in his sister and sister; Heart attack (age of onset: 54) in his sister; Heart disease in his sister; Kidney disease in his paternal grandfather; Liver cancer in his sister; Prostate cancer in his father; Stroke (age of onset: 44) in his sister; Stroke (age of onset: 8) in his father.   Discussed the use of AI scribe software for clinical note transcription with the patient, who gave verbal consent to proceed.  ASCVD Risk Score: The 10-year ASCVD risk score (Arnett DK, et al., 2019) is: 18.4%   Values used to calculate the score:     Age: 30 years     Sex: Male     Is Non-Hispanic African American: No     Diabetic: No     Tobacco smoker: No     Systolic Blood Pressure: 122 mmHg     Is BP treated: No     HDL Cholesterol: 44.6 mg/dL     Total Cholesterol: 191 mg/dL    Diet: Eats out some Breakfast - Raisin bran or grits, sausage Lunch - Banana or peanut butter sandwich, occasional hamburger Dinners - Mashed potatoes, collards, chicken, steak or ribs  Cookies, ice cream, chips usually daily - oatmeal cakes Sweet tea occasionally, up to 2 Cokes daily, lots of water No EtOH  Activity: Limited by SOB and right hip pain Yard work with Education administrator, removal of limbs  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, does the management  No results found for: "LIPOA"    ROS: See HPI       Studies Reviewed: Marland Kitchen   EKG Interpretation Date/Time:  Wednesday January 06 2024 10:37:06 EDT Ventricular Rate:  81 PR Interval:  196 QRS Duration:  90 QT Interval:  384 QTC Calculation: 446 R Axis:   18  Text Interpretation: Sinus rhythm with Premature atrial complexes When compared with ECG of 04-Nov-2017 09:26, Premature atrial complexes are now Present QT has lengthened Confirmed by Eligha Bridegroom [phone]) on 01/06/2024 10:46:52 AM      Risk Assessment/Calculations:             Physical Exam:   VS: BP 122/82    Pulse 88   Wt 275 lb 12.8 oz (125.1 kg)   SpO2 97%   BMI 38.47 kg/m   Wt Readings from Last 3 Encounters:  01/06/24 275 lb 12.8 oz (125.1 kg)  12/15/23 260 lb (117.9 kg)  12/03/23 260 lb (117.9 kg)     GEN: Obese, well developed in no acute distress NECK: No JVD; No carotid bruits CARDIAC: RRR, no murmurs, rubs, gallops RESPIRATORY:  Clear to auscultation without rales, wheezing or rhonchi  ABDOMEN: Soft, non-tender, non-distended EXTREMITIES:  No edema; No deformity     ASSESSMENT AND PLAN: .    CAD: Significantly elevated CAC score with the bulk of calcification in RCA. Additional risk factors of hyperlipidemia, prediabetes, and family history.  He is having shortness of breath with minimal exertion.  Based on these concerns, we will get cardiac PET/CT for evaluation of ischemia.  LDL 116 on 11/13/2023. He was started on Repatha in addition to ezetimibe for management of cholesterol and has completed one injection with no side effects. A1C places him in prediabetes range. Emphasized the importance of healthy lifestyle including regular exercise and heart healthy diet. Weight loss encouraged. Will consider starting aspirin 81 mg if evidence of coronary stenosis.   Shortness of breath: He is having shortness of breath with minimal exertion.  He is a former smoker but has not smoked in 40 years. He denies orthopnea or PND. He has mild bilateral LE edema. Admits to unrestricted diet.  We will get echocardiogram to evaluate heart and valve function.  Getting PET/CT as noted above for evaluation of ischemia.  Hyperlipidemia LDL goal < 70: Lipid panel completed 11/13/2023 with total cholesterol 191, triglycerides 156, HDL 44, LDL 116.  He was intolerant to atorvastatin and rosuvastatin.  He was started on Repatha 12/14/2023 by PCP.  He has completed 1 injection with no adverse side effects.  He has follow-up lab work scheduled with PCP.  Continue ezetimibe and Repatha.  Blood pressure: BP  initially elevated but improved on my recheck. He reports history of hypertension but has not been on antihypertensives for several months due to hypotension. BP is well-controlled today. He monitors at home and reports rare SBP in the 150s. Advised goal is 130/80 or lower. Continue to monitor.  Consider low-dose beta-blocker if BP is above goal.  PACs: PACs noted on EKG.  He is asymptomatic.  We are getting echocardiogram to evaluate heart function.  CV Risk Assessment: ASCVD risk score is 18.4%.  He has additional risk factors of hyperlipidemia and prediabetes.  He has not smoked in 40 years.  BP has been well-controlled although he did take antihypertensive therapy in the past.  I have asked him to monitor carefully.  He is on Repatha and ezetimibe for management  of hyperlipidemia.  We are getting echo and PET/CT for ischemia evaluation and evaluation of shortness of breath.  Plan/Goals: 1: Aim to ride the stationary bike for a goal of 150 minutes or more per week 2: Eat a heart healthy diet mostly composed of whole grains, fruits, vegetables, and lean protein 3: Avoid sugar and simple carbohydrates like soda, cakes, cookies, sugary cereal and processed foods 4: Limit saturated fat and aim to eat lean portions of protein like beans, chicken, [place], and fish      Informed Consent   Shared Decision Making/Informed Consent The risks [chest pain, shortness of breath, cardiac arrhythmias, dizziness, blood pressure fluctuations, myocardial infarction, stroke/transient ischemic attack, nausea, vomiting, allergic reaction, radiation exposure, metallic taste sensation and life-threatening complications (estimated to be 1 in 10,000)], benefits (risk stratification, diagnosing coronary artery disease, treatment guidance) and alternatives of a cardiac PET stress test were discussed in detail with Charles [name] and he agrees to proceed.     Disposition: 3 months with me  Signed, Eligha Bridegroom, NP-C

## 2024-01-06 ENCOUNTER — Encounter (HOSPITAL_BASED_OUTPATIENT_CLINIC_OR_DEPARTMENT_OTHER): Payer: Self-pay | Admitting: Nurse Practitioner

## 2024-01-06 ENCOUNTER — Ambulatory Visit (HOSPITAL_BASED_OUTPATIENT_CLINIC_OR_DEPARTMENT_OTHER): Admitting: Nurse Practitioner

## 2024-01-06 ENCOUNTER — Encounter (HOSPITAL_BASED_OUTPATIENT_CLINIC_OR_DEPARTMENT_OTHER): Payer: Self-pay

## 2024-01-06 VITALS — BP 122/82 | HR 88 | Wt 275.8 lb

## 2024-01-06 DIAGNOSIS — R030 Elevated blood-pressure reading, without diagnosis of hypertension: Secondary | ICD-10-CM | POA: Diagnosis not present

## 2024-01-06 DIAGNOSIS — I491 Atrial premature depolarization: Secondary | ICD-10-CM

## 2024-01-06 DIAGNOSIS — E785 Hyperlipidemia, unspecified: Secondary | ICD-10-CM | POA: Diagnosis not present

## 2024-01-06 DIAGNOSIS — Z7189 Other specified counseling: Secondary | ICD-10-CM

## 2024-01-06 DIAGNOSIS — I2510 Atherosclerotic heart disease of native coronary artery without angina pectoris: Secondary | ICD-10-CM

## 2024-01-06 DIAGNOSIS — R0602 Shortness of breath: Secondary | ICD-10-CM | POA: Diagnosis not present

## 2024-01-06 NOTE — Patient Instructions (Signed)
 Medication Instructions:   Your physician recommends that you continue on your current medications as directed. Please refer to the Current Medication list given to you today.   *If you need a refill on your cardiac medications before your next appointment, please call your pharmacy*   Lab Work:  None ordered.  If you have labs (blood work) drawn today and your tests are completely normal, you will receive your results only by: MyChart Message (if you have MyChart) OR A paper copy in the mail If you have any lab test that is abnormal or we need to change your treatment, we will call you to review the results.   Testing/Procedures:  Your physician has requested that you have an echocardiogram. Echocardiography is a painless test that uses sound waves to create images of your heart. It provides your doctor with information about the size and shape of your heart and how well your heart's chambers and valves are working. This procedure takes approximately one hour. There are no restrictions for this procedure. Please do NOT wear cologne, aftershave, or lotions (deodorant is allowed). Please arrive 15 minutes prior to your appointment time.  Please note:  Due to An adult accompanying a patient to their appointment will only be allowed in the ultrasound room at the discretion of the ultrasound technician under special circumstances. We apologize for any inconvenience.     Please report to Radiology at the [facility] Main Entrance 30 minutes early for your test.  [address] [place], [place] [address]  How to Prepare for Your Cardiac PET/CT Stress Test:  Nothing to eat or drink, except water, 3 hours prior to arrival time.  NO caffeine/decaffeinated products, or chocolate 12 hours prior to arrival. (Please note decaffeinated beverages (teas/coffees) still contain caffeine).  If you have caffeine within 12 hours prior, the test will need to be rescheduled.  Medication  instructions: Do not take erectile dysfunction medications for 72 hours prior to test (sildenafil, tadalafil)  NO cologne or lotion on chest or abdomen area.  Total time is 1 to 2 hours; you may want to bring reading material for the waiting time.  In preparation for your appointment, medication and supplies will be purchased.  Appointment availability is limited, so if you need to cancel or reschedule, please call the Radiology Department Scheduler at [phone] 24 hours in advance to avoid a cancellation fee of $100.00  What to Expect When you Arrive:  Once you arrive and check in for your appointment, you will be taken to a preparation room within the Radiology Department.  A technologist or Nurse will obtain your medical history, verify that you are correctly prepped for the exam, and explain the procedure.  Afterwards, an IV will be started in your arm and electrodes will be placed on your skin for EKG monitoring during the stress portion of the exam. Then you will be escorted to the PET/CT scanner.  There, staff will get you positioned on the scanner and obtain a blood pressure and EKG.  During the exam, you will continue to be connected to the EKG and blood pressure machines.  A small, safe amount of a radioactive tracer will be injected in your IV to obtain a series of pictures of your heart along with an injection of a stress agent.    After your Exam:  It is recommended that you eat a meal and drink a caffeinated beverage to counter act any effects of the stress agent.  Drink  plenty of fluids for the remainder of the day and urinate frequently for the first couple of hours after the exam.  Your doctor will inform you of your test results within 7-10 business days.  For more information and frequently asked questions, please visit our website: [email or web address]  For questions about your test or how to prepare for your test, please call: Cardiac Imaging Nurse  Navigators Office: [phone]  Follow-Up: At [facility], you and your health needs are our priority.  As part of our continuing mission to provide you with exceptional heart care, we have created designated Provider Care Teams.  These Care Teams include your primary Cardiologist (physician) and Advanced Practice Providers (APPs -  Physician Assistants and Nurse Practitioners) who all work together to provide you with the care you need, when you need it.  We recommend signing up for the patient portal called "MyChart".  Sign up information is provided on this After Visit Summary.  MyChart is used to connect with patients for Virtual Visits (Telemedicine).  Patients are able to view lab/test results, encounter notes, upcoming appointments, etc.  Non-urgent messages can be sent to your provider as well.   To learn more about what you can do with MyChart, go to [email or web address].    Your next appointment:   3 month(s)  Provider:   Eligha Bridegroom, NP

## 2024-01-12 ENCOUNTER — Other Ambulatory Visit: Payer: Self-pay | Admitting: Internal Medicine

## 2024-01-27 ENCOUNTER — Institutional Professional Consult (permissible substitution) (HOSPITAL_BASED_OUTPATIENT_CLINIC_OR_DEPARTMENT_OTHER): Payer: Medicare HMO | Admitting: Nurse Practitioner

## 2024-01-29 ENCOUNTER — Ambulatory Visit (HOSPITAL_BASED_OUTPATIENT_CLINIC_OR_DEPARTMENT_OTHER)

## 2024-01-29 DIAGNOSIS — R0602 Shortness of breath: Secondary | ICD-10-CM

## 2024-01-29 DIAGNOSIS — I2510 Atherosclerotic heart disease of native coronary artery without angina pectoris: Secondary | ICD-10-CM

## 2024-01-29 LAB — ECHOCARDIOGRAM COMPLETE
AR max vel: 2.11 cm2
AV Area VTI: 2.02 cm2
AV Area mean vel: 2.17 cm2
AV Mean grad: 5.5 mmHg
AV Peak grad: 12.4 mmHg
Ao pk vel: 1.76 m/s
Area-P 1/2: 2.59 cm2
Calc EF: 73.6 %
MV VTI: 2.18 cm2
Single Plane A2C EF: 75.9 %
Single Plane A4C EF: 67.6 %

## 2024-01-29 MED ORDER — PERFLUTREN LIPID MICROSPHERE
1.0000 mL | INTRAVENOUS | Status: AC | PRN
  Administered 2024-01-29: 4 mL via INTRAVENOUS

## 2024-02-01 ENCOUNTER — Encounter (HOSPITAL_BASED_OUTPATIENT_CLINIC_OR_DEPARTMENT_OTHER): Payer: Self-pay

## 2024-02-14 ENCOUNTER — Other Ambulatory Visit: Payer: Self-pay | Admitting: Internal Medicine

## 2024-03-21 ENCOUNTER — Other Ambulatory Visit: Payer: Self-pay | Admitting: Internal Medicine

## 2024-03-22 ENCOUNTER — Telehealth (HOSPITAL_COMMUNITY): Payer: Self-pay | Admitting: Emergency Medicine

## 2024-03-22 NOTE — Telephone Encounter (Signed)
Attempted to call patient regarding upcoming cardiac PET appointment. Left message on voicemail with name and callback number Aidan Moten RN Navigator Cardiac Imaging [facility] [phone] Office [phone] Cell  

## 2024-03-23 ENCOUNTER — Other Ambulatory Visit: Payer: Self-pay | Admitting: Family Medicine

## 2024-03-23 ENCOUNTER — Encounter (HOSPITAL_COMMUNITY)
Admission: RE | Admit: 2024-03-23 | Discharge: 2024-03-23 | Disposition: A | Discharge status: Home / Self Care | Source: Ambulatory Visit | Attending: Nurse Practitioner | Admitting: Nurse Practitioner

## 2024-03-23 ENCOUNTER — Ambulatory Visit (HOSPITAL_BASED_OUTPATIENT_CLINIC_OR_DEPARTMENT_OTHER): Payer: Self-pay | Admitting: Nurse Practitioner

## 2024-03-23 ENCOUNTER — Other Ambulatory Visit: Payer: Self-pay | Admitting: Internal Medicine

## 2024-03-23 DIAGNOSIS — R0602 Shortness of breath: Secondary | ICD-10-CM | POA: Diagnosis not present

## 2024-03-23 DIAGNOSIS — I2510 Atherosclerotic heart disease of native coronary artery without angina pectoris: Secondary | ICD-10-CM

## 2024-03-23 LAB — NM PET CT CARDIAC PERFUSION MULTI W/ABSOLUTE BLOODFLOW
LV dias vol: 145 mL (ref 62–150)
LV sys vol: 62 mL
MBFR: 2.5
Nuc Rest EF: 57 %
Nuc Stress EF: 64 %
Rest MBF: 0.82 ml/g/min
Rest Nuclear Isotope Dose: 30 mCi
ST Depression (mm): 0 mm
Stress MBF: 2.05 ml/g/min
Stress Nuclear Isotope Dose: 30 mCi

## 2024-03-23 MED ORDER — REGADENOSON 0.4 MG/5ML IV SOLN
INTRAVENOUS | Status: AC
  Filled 2024-03-23: qty 5

## 2024-03-23 MED ORDER — REGADENOSON 0.4 MG/5ML IV SOLN
0.4000 mg | Freq: Once | INTRAVENOUS | Status: AC
  Administered 2024-03-23: 0.4 mg via INTRAVENOUS

## 2024-03-23 MED ORDER — RUBIDIUM RB82 GENERATOR (RUBYFILL)
29.9000 | PACK | Freq: Once | INTRAVENOUS | Status: AC
  Administered 2024-03-23: 29.9 via INTRAVENOUS

## 2024-03-23 MED ORDER — RUBIDIUM RB82 GENERATOR (RUBYFILL)
30.0000 | PACK | Freq: Once | INTRAVENOUS | Status: AC
  Administered 2024-03-23: 30 via INTRAVENOUS

## 2024-04-11 ENCOUNTER — Ambulatory Visit (INDEPENDENT_AMBULATORY_CARE_PROVIDER_SITE_OTHER): Admitting: Nurse Practitioner

## 2024-04-11 ENCOUNTER — Encounter (HOSPITAL_BASED_OUTPATIENT_CLINIC_OR_DEPARTMENT_OTHER): Payer: Self-pay | Admitting: Nurse Practitioner

## 2024-04-11 VITALS — BP 119/75 | HR 78 | Ht 71.0 in | Wt 275.4 lb

## 2024-04-11 DIAGNOSIS — I77810 Thoracic aortic ectasia: Secondary | ICD-10-CM | POA: Diagnosis not present

## 2024-04-11 DIAGNOSIS — R0602 Shortness of breath: Secondary | ICD-10-CM | POA: Diagnosis not present

## 2024-04-11 DIAGNOSIS — R030 Elevated blood-pressure reading, without diagnosis of hypertension: Secondary | ICD-10-CM | POA: Diagnosis not present

## 2024-04-11 DIAGNOSIS — E785 Hyperlipidemia, unspecified: Secondary | ICD-10-CM | POA: Diagnosis not present

## 2024-04-11 DIAGNOSIS — I2510 Atherosclerotic heart disease of native coronary artery without angina pectoris: Secondary | ICD-10-CM

## 2024-04-11 DIAGNOSIS — I491 Atrial premature depolarization: Secondary | ICD-10-CM | POA: Diagnosis not present

## 2024-04-11 MED ORDER — TIRZEPATIDE 2.5 MG/0.5ML ~~LOC~~ SOAJ: 2.5000 mg | SUBCUTANEOUS | 1 refills | Status: DC

## 2024-04-11 NOTE — Progress Notes (Signed)
 SABRASABRASABRA............... Cardiology Office Note:  .   Date:  04/11/2024 ID:  Charles [name], DOB 1951-02-13, MRN [id number] PCP: Geofm Glade PARAS, MD [facility] Providers Cardiologist:  None   Patient Profile: .      PMH Hyperlipidemia Coronary artery disease CT calcium  score 11/25/23 CAC score 1890 (92nd percentile) LM 0, LAD 489, LCx 270, RCA 1133 Thoracic aortic aneurysm Hypertension Prediabetes  Statin intolerance (atorvastatin  and rosuvastatin ) Former tobacco abuse Quit in 1983  Referred to cardiology and seen by me on 01/06/24 as new patient for elevated coronary calcium  score. 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  He does not exercise on a consistent basis. Having shortness of breath recently, most noticeable recently with having to chase his dog when he ran away from him. No associated symptoms of chest pain, palpitations, presyncope, diaphoresis, or n/v.  Activity is somewhat limited by hip pain, but some yard work recently.  He has been told he needs a hip replacement.  He walks around [place] home improvement store but admits he feels better if he uses a cart due to SOB and hip pain.  He does not have shortness of breath at rest.  He denies orthopnea and PND. Mild bilateral LE edema, right > left.  History of hypertension but stopped taking antihypertensive medication last year because SBP was 75 and he felt very fatigued.  He follows BP at home and reports it is consistently 130s over 80s with occasional elevations to 150. Was started on Repatha  for elevated cholesterol by PCP and took first dose on 12/29/2023 with no adverse side effects. Family history significant for stroke and heart disease in one sister. Admits to dietary indiscretion with soda, oatmeal cream pies, sausage, and raisin bran. ASCVD risk score 28.7%. Frequent PACs noted on EKG. Cardiac PET CT ordered to r/o ischemia and was low risk with no evidence of  infarction or ischemia. Echo 01/29/24 revealed normal LVEF 60-65%, G1DD, normal RV, aortic sclerosis with no evidence of stenosis, and mild aortic dilatation.        History of Present Illness: .   Discussed the use of AI scribe software for clinical note transcription with the patient, who gave verbal consent to proceed.  History of Present Illness Charles [name] is a very pleasant 73 year old male who presents for follow-up of coronary artery disease. He requests that we review cardiac test results in detail. Reviewed results of cardiac PET CT which was low risk with no evidence of ischemia or infarction and echo which revealed normal LVEF of  60-65% and grade one diastolic dysfunction. He reports significant right hip pain and difficulty with mobility. He reports that he needs surgery but has been only eating cardiac testing.  He is interested in GLP-1 for weight loss.  He has discussed with his PCP.  History of prediabetes and thyroid  issues.  He reports these things along with hip pain make exercising difficult, but he remains active around the home with regular yard work. Family history includes diabetes in several members and thyroid  issues in his sister. No family history of thyroid  or medullary cell cancer.  He denies chest pain, shortness of breath, orthopnea, PND, edema, presyncope, syncope.  Recent chest congestion with some wheezing noted. He has been working on dietary improvements to reduce intake of sugar and saturated fat.   Discussed the use of AI scribe software for clinical note transcription with the patient, who  gave verbal consent to proceed.  No results found for: LIPOA    ROS: See HPI       Studies Reviewed: .          Risk Assessment/Calculations:             Physical Exam:   VS: BP 119/75   Pulse 78   Ht 5' 11 (1.803 m)   Wt 275 lb 6.4 oz (124.9 kg)   SpO2 93%   BMI 38.41 kg/m   Wt Readings from Last 3 Encounters:  04/11/24 275 lb 6.4 oz (124.9 kg)   01/06/24 275 lb 12.8 oz (125.1 kg)  12/15/23 260 lb (117.9 kg)     GEN: Obese, well developed in no acute distress NECK: No JVD; No carotid bruits CARDIAC: RRR, no murmurs, rubs, gallops RESPIRATORY:  Clear to auscultation without rales, wheezing or rhonchi  ABDOMEN: Soft, non-tender, non-distended EXTREMITIES:  No edema; No deformity     ASSESSMENT AND PLAN: .    CAD: Significantly elevated CAC score with the bulk of calcification in RCA. Additional risk factors of hyperlipidemia, prediabetes, and family history. Reported shortness of breath with minimal exertion at last office visit. PET CT and echo completed for evaluation of ischemia and heart failure.  Echo revealed normal LVEF, mild diastolic dysfunction, no significant valve disease.  Cardiac PET/CT completed 03/23/24 low risk, no evidence of ischemia or infarction.  He was started on Repatha  by PCP. We will get repeat lipid levels today. Lengthy discussion about prevention of worsening CAD. ER precautions reviewed. Emphasized the importance of healthy lifestyle including regular exercise and heart healthy diet. Weight loss encouraged. Continue aspirin, Repatha , ezetimibe .   Shortness of breath: History of shortness of breath with exertion. Mobility is limited by hip pain and he does not exercise regularly. Former smoker, quit 40 years ago. He reports recent chest congestion that is improving. Audible wheeze noted. He denies orthopnea or PND. Normal LVEF, mild G1DD on echo 01/29/24. No evidence of ischemia on cardiac PET CT. Low sodium diet encouraged. No further testing indicated at this time. He is motivated to lose weight. We will attempt to get coverage for GLP1 as noted below.   Thoracic Aortic Dilatation: Mild ascending aortic dilatation at 41 mm on echo 01/29/24. Aneurysm precautions reviewed. We will plan for CTA aorta in 1 year.   Hyperlipidemia LDL goal < 70: Lipid panel completed 11/13/2023 with total cholesterol 191, triglycerides  156, HDL 44, LDL 116.  He was intolerant to atorvastatin  and rosuvastatin .  He was started on Repatha  12/14/2023 by PCP in addition to ezetimibe . No adverse side effects. We will get repeat lipid panel today. Continue ezetimibe  and Repatha .  Obesity: BMI is 38. He expresses desire to lose weight and is interested in GLP-1 agonist therapy.  He asks if I can prescribe the medication.  We discussed that may be cost prohibitive given that he is prediabetic with A1C of 6.0% in January. We will order tirzepatide 2.5 mg once weekly with plan for telephone call in 1 month to assess tolerance prior to dose increase. Encouraged regular physical activity, particularly weight lifting once on GLP along with healthy diet to aid in weight loss.   Blood pressure elevated: BP was elevated at last office visit but is well controlled today. History of hypertension, has not been on antihypertensives for several months due to hypotension. Continue routine monitoring for goal < 130/80.   PACs: PACs noted on EKG. Echo reveals normal LVEF. He is asymptomatic.  Disposition: 6 months with me  Signed, Rosaline Bane, NP-C

## 2024-04-11 NOTE — Patient Instructions (Addendum)
 Medication Instructions:  Your physician has recommended you make the following change in your medication:   Start: Mounjaro 2.5mg  weekly times one month *If you need a refill on your cardiac medications before your next appointment, please call your pharmacy*  Lab Work: Labs today- Lipid Panel & CMP   Follow-Up: At [facility], you and your health needs are our priority.  As part of our continuing mission to provide you with exceptional heart care, our providers are all part of one team.  This team includes your primary Cardiologist (physician) and Advanced Practice Providers or APPs (Physician Assistants and Nurse Practitioners) who all work together to provide you with the care you need, when you need it.  Your next appointment:   6 months with Rosaline Bane, NP   Other Instructions  One of your tests has shown an aneurysm of your aorta in your chest. The word aneurysm refers to a bulge in an artery (blood vessel). Most people think of them in the context of an emergency, but yours was found incidentally. At this point there is nothing you need to do from a procedure standpoint, but there are some important things to keep in mind for day-to-day life.  Mainstays of therapy for aneurysms include very good blood pressure control, healthy lifestyle, and avoiding tobacco products and street drugs. Research has raised concern that antibiotics in the fluoroquinolone class could be associated with increased risk of having an aneurysm develop or tear. This includes medicines that end in floxacin, like Cipro or Levaquin. Make sure to discuss this information with other healthcare providers if you require antibiotics.  Since aneurysms can run in families, you should discuss your diagnosis with first degree relatives as they may need to be screened for this. Regular mild-moderate physical exercise is important, but avoid heavy lifting/weight lifting over 30lbs, chopping wood, shoveling  snow or digging heavy earth with a shovel. It is best to avoid activities that cause grunting or straining (medically referred to as a Valsalva maneuver). This happens when a person bears down against a closed throat to increase the strength of arm or abdominal muscles. There's often a tendency to do this when lifting heavy weights, doing sit-ups, push-ups or chin-ups, etc., but it may be harmful.  This is a finding I would expect to be monitored periodically by your cardiology team. Most unruptured thoracic aortic aneurysms cause no symptoms, so they are often found during exams for other conditions. Contact a health care provider if you develop any discomfort in your upper back, neck, abdomen, trouble swallowing, cough or hoarseness, or unexplained weight loss. Get help right away if you develop severe pain in your upper back or abdomen that may move into your chest and arms, or any other concerning symptoms such as shortness of breath or fever.

## 2024-04-12 ENCOUNTER — Other Ambulatory Visit (HOSPITAL_COMMUNITY): Payer: Self-pay

## 2024-04-12 ENCOUNTER — Telehealth: Payer: Self-pay | Admitting: Pharmacy Technician

## 2024-04-12 ENCOUNTER — Ambulatory Visit: Payer: Self-pay | Admitting: Nurse Practitioner

## 2024-04-12 LAB — LIPID PANEL
Chol/HDL Ratio: 2.5 ratio (ref 0.0–5.0)
Cholesterol, Total: 86 mg/dL — ABNORMAL LOW (ref 100–199)
HDL: 35 mg/dL — ABNORMAL LOW (ref >39)
LDL Chol Calc (NIH): 20 mg/dL (ref 0–99)
Triglycerides: 196 mg/dL — ABNORMAL HIGH (ref 0–149)
VLDL Cholesterol Cal: 31 mg/dL (ref 5–40)

## 2024-04-12 LAB — COMPREHENSIVE METABOLIC PANEL WITH GFR
ALT: 20 IU/L (ref 0–44)
AST: 25 IU/L (ref 0–40)
Albumin: 4.3 g/dL (ref 3.8–4.8)
Alkaline Phosphatase: 99 IU/L (ref 44–121)
BUN/Creatinine Ratio: 9 — ABNORMAL LOW (ref 10–24)
BUN: 11 mg/dL (ref 8–27)
Bilirubin Total: 0.5 mg/dL (ref 0.0–1.2)
CO2: 19 mmol/L — ABNORMAL LOW (ref 20–29)
Calcium: 9.3 mg/dL (ref 8.6–10.2)
Chloride: 102 mmol/L (ref 96–106)
Creatinine, Ser: 1.24 mg/dL (ref 0.76–1.27)
Globulin, Total: 2.7 g/dL (ref 1.5–4.5)
Glucose: 143 mg/dL — ABNORMAL HIGH (ref 70–99)
Potassium: 4.1 mmol/L (ref 3.5–5.2)
Sodium: 136 mmol/L (ref 134–144)
Total Protein: 7 g/dL (ref 6.0–8.5)
eGFR: 62 mL/min/{1.73_m2} (ref >59)

## 2024-04-12 NOTE — Telephone Encounter (Signed)
 FYI

## 2024-04-12 NOTE — Telephone Encounter (Signed)
 Pharmacy Patient Advocate Encounter   Received notification from CoverMyMeds that prior authorization for MOUNJARO 2.5MG  is required/requested.   Insurance verification completed.   The patient is insured through [employer] .   Per test claim: PA required; PA submitted to above mentioned insurance via CoverMyMeds Key/confirmation #/EOC [id number] Status is pending    To be noted: I ran a test claim for wegovy since the only question for mounjaro was if he is diabetic and he isnt. Charles [name] said not covered under part d law

## 2024-04-12 NOTE — Telephone Encounter (Signed)
 Please notify pt that unfortunately this class of medication will not be covered due to no history of diabetes. I am attaching the information for cash price below:     The price right now is $650 cash pay for the auto injector dosing (this cost adjustment expires this summer I believe, so stay tuned).  This self pay program is $349 for the 2.5 mg dose (monthly), then $499 montly for the 5, 7.5, and 10 mg monthly doses. Right now, there is not an option for the 12.5 or 15 mg dose through this program. If they choose to use this program, the medication comes in multidose vials. They will need to be comfortable drawing up and injecting the dose. This is similar to how most compounding pharmacies are dosing the medication. The website says that they can get the supplies through [employer] (for syringes and alcohol swabs, etc) but I am not sure of the cost. These could also be prescribed to the pharmacy of their choosing if more affordable. This program has to be ordered differently. The EMR does not currently offer the multidose vials as an option, so you select the appropriate dose (tirzepatide--weight management) and in the signature, you state "Dispense vials. Vial NDC not available in EHR". You then send the prescription to the [facility], which is available in the EMR. The patient will get a text about the program. I did this in clinic today, and the patient got the text nearly instantly.    Here is a link to the information from the website. [email or web address]  Ask him to let us  know if he would like to enroll and we will send a prescription.

## 2024-04-12 NOTE — Telephone Encounter (Signed)
 Pharmacy Patient Advocate Encounter  Received notification from [facility] that Prior Authorization for MOUNJARO 2.5MG  has been DENIED.  Full denial letter will be uploaded to the media tab. See denial reason below.     I also tried wegovy on his insurance and they said medication not covered, plan exclusion.

## 2024-04-14 ENCOUNTER — Other Ambulatory Visit: Payer: Self-pay | Admitting: Nurse Practitioner

## 2024-04-14 MED ORDER — WEGOVY 0.25 MG/0.5ML ~~LOC~~ SOAJ: 0.2500 mg | SUBCUTANEOUS | 2 refills | Status: DC

## 2024-04-14 NOTE — Addendum Note (Signed)
 Addended by: PERCY ROSALINE HERO on: 04/14/2024 04:50 PM   Modules accepted: Orders

## 2024-04-23 ENCOUNTER — Other Ambulatory Visit: Payer: Self-pay | Admitting: Family Medicine

## 2024-04-28 ENCOUNTER — Other Ambulatory Visit: Payer: Self-pay | Admitting: Internal Medicine

## 2024-05-03 ENCOUNTER — Telehealth: Payer: Self-pay | Admitting: Pharmacy Technician

## 2024-05-03 NOTE — Telephone Encounter (Signed)
 Received cmm for mounjaro  from [facility]. Mounjaro  is not covered by plan. I called [facility] to let them know insurance will not cover.

## 2024-05-06 ENCOUNTER — Other Ambulatory Visit: Payer: Self-pay | Admitting: Internal Medicine

## 2024-05-06 MED ORDER — COLCHICINE 0.6 MG PO TABS: 0.6000 mg | ORAL_TABLET | Freq: Every day | ORAL | 1 refills | Status: DC

## 2024-05-06 NOTE — Telephone Encounter (Signed)
 Copied from CRM [id number]. Topic: Clinical - Medication Refill >> May 06, 2024  8:24 AM Burnard DEL wrote: Medication: colchicine  0.6 MG tablet  Has the patient contacted their pharmacy? Yes (Agent: If no, request that the patient contact the pharmacy for the refill. If patient does not wish to contact the pharmacy document the reason why and proceed with request.) (Agent: If yes, when and what did the pharmacy advise?)  This is the patient's preferred pharmacy:  [id number] [place], [place] - [address] [place] [address] Phone: [phone] Fax: [phone]    Is this the correct pharmacy for this prescription? Yes If no, delete pharmacy and type the correct one.   Has the prescription been filled recently? No  Is the patient out of the medication? Yes  Has the patient been seen for an appointment in the last year OR does the patient have an upcoming appointment? Yes  Can we respond through MyChart? Yes  Agent: Please be advised that Rx refills may take up to 3 business days. We ask that you follow-up with your pharmacy.

## 2024-05-11 NOTE — Patient Instructions (Addendum)
 Blood work was ordered.       Medications changes include :   wegovy       Return in about 6 months (around 11/12/2024) for follow up.     Health Maintenance, Male Adopting a healthy lifestyle and getting preventive care are important in promoting health and wellness. Ask your health care provider about: The right schedule for you to have regular tests and exams. Things you can do on your own to prevent diseases and keep yourself healthy. What should I know about diet, weight, and exercise? Eat a healthy diet  Eat a diet that includes plenty of vegetables, fruits, low-fat dairy products, and lean protein. Do not eat a lot of foods that are high in solid fats, added sugars, or sodium. Maintain a healthy weight Body mass index (BMI) is a measurement that can be used to identify possible weight problems. It estimates body fat based on height and weight. Your health care provider can help determine your BMI and help you achieve or maintain a healthy weight. Get regular exercise Get regular exercise. This is one of the most important things you can do for your health. Most adults should: Exercise for at least 150 minutes each week. The exercise should increase your heart rate and make you sweat (moderate-intensity exercise). Do strengthening exercises at least twice a week. This is in addition to the moderate-intensity exercise. Spend less time sitting. Even light physical activity can be beneficial. Watch cholesterol and blood lipids Have your blood tested for lipids and cholesterol at 72 years of age, then have this test every 5 years. You may need to have your cholesterol levels checked more often if: Your lipid or cholesterol levels are high. You are older than 72 years of age. You are at high risk for heart disease. What should I know about cancer screening? Many types of cancers can be detected early and may often be prevented. Depending on your health history and  family history, you may need to have cancer screening at various ages. This may include screening for: Colorectal cancer. Prostate cancer. Skin cancer. Lung cancer. What should I know about heart disease, diabetes, and high blood pressure? Blood pressure and heart disease High blood pressure causes heart disease and increases the risk of stroke. This is more likely to develop in people who have high blood pressure readings or are overweight. Talk with your health care provider about your target blood pressure readings. Have your blood pressure checked: Every 3-5 years if you are 60-77 years of age. Every year if you are 32 years old or older. If you are between the ages of 1 and 72 and are a current or former smoker, ask your health care provider if you should have a one-time screening for abdominal aortic aneurysm (AAA). Diabetes Have regular diabetes screenings. This checks your fasting blood sugar level. Have the screening done: Once every three years after age 49 if you are at a normal weight and have a low risk for diabetes. More often and at a younger age if you are overweight or have a high risk for diabetes. What should I know about preventing infection? Hepatitis B If you have a higher risk for hepatitis B, you should be screened for this virus. Talk with your health care provider to find out if you are at risk for hepatitis B infection. Hepatitis C Blood testing is recommended for: Everyone born from [birth year] through 1964. Anyone with known risk factors for hepatitis  C. Sexually transmitted infections (STIs) You should be screened each year for STIs, including gonorrhea and chlamydia, if: You are sexually active and are younger than 72 years of age. You are older than 72 years of age and your health care provider tells you that you are at risk for this type of infection. Your sexual activity has changed since you were last screened, and you are at increased risk for chlamydia or  gonorrhea. Ask your health care provider if you are at risk. Ask your health care provider about whether you are at high risk for HIV. Your health care provider may recommend a prescription medicine to help prevent HIV infection. If you choose to take medicine to prevent HIV, you should first get tested for HIV. You should then be tested every 3 months for as long as you are taking the medicine. Follow these instructions at home: Alcohol use Do not drink alcohol if your health care provider tells you not to drink. If you drink alcohol: Limit how much you have to 0-2 drinks a day. Know how much alcohol is in your drink. In the [place] one drink equals one 12 oz bottle of beer (355 mL), one 5 oz glass of wine (148 mL), or one 1 oz glass of hard liquor (44 mL). Lifestyle Do not use any products that contain nicotine or tobacco. These products include cigarettes, chewing tobacco, and vaping devices, such as e-cigarettes. If you need help quitting, ask your health care provider. Do not use street drugs. Do not share needles. Ask your health care provider for help if you need support or information about quitting drugs. General instructions Schedule regular health, dental, and eye exams. Stay current with your vaccines. Tell your health care provider if: You often feel depressed. You have ever been abused or do not feel safe at home. Summary Adopting a healthy lifestyle and getting preventive care are important in promoting health and wellness. Follow your health care provider's instructions about healthy diet, exercising, and getting tested or screened for diseases. Follow your health care provider's instructions on monitoring your cholesterol and blood pressure. This information is not intended to replace advice given to you by your health care provider. Make sure you discuss any questions you have with your health care provider. Document Revised: 02/25/2021 Document Reviewed: 02/25/2021 Elsevier  Patient Education  2024 [employer].

## 2024-05-11 NOTE — Progress Notes (Unsigned)
 Subjective:    Patient ID: Charles [name], male    DOB: 1951/07/26, 73 y.o.   MRN: [id number]     HPI Charles [name] is here for a physical exam and his chronic medical problems.   Mounjaro  not covered by insurance, Wegovy  ? covered by insurance   OA  - hip, knees, shoulders-has pain all the time and it does limit him in what he can do.  Medications and allergies reviewed with patient and updated if appropriate.  Current Outpatient Medications on File Prior to Visit  Medication Sig Dispense Refill   aspirin 81 MG chewable tablet Chew 81 mg by mouth daily.     colchicine  0.6 MG tablet Take 1 tablet (0.6 mg total) by mouth daily. 90 tablet 1   Evolocumab  (REPATHA  SURECLICK) 140 MG/ML SOAJ INJECT 140 MG INTO THE SKIN EVERY 14 (FOURTEEN) DAYS. 2 mL 5   ezetimibe  (ZETIA ) 10 MG tablet TAKE 1 TABLET BY MOUTH EVERY DAY 90 tablet 3   levothyroxine  (SYNTHROID ) 200 MCG tablet TAKE 1 TABLET BY MOUTH EVERY DAY 90 tablet 3   levothyroxine  (SYNTHROID ) 25 MCG tablet TAKE 1 TABLET BY MOUTH DAILY IN ADDITION TO 200 MCG TABLET FOR TOTAL OF 225 MCG DAILY 90 tablet 3   omeprazole (PRILOSEC) 20 MG capsule Take 20 mg by mouth daily.     Semaglutide -Weight Management (WEGOVY ) 0.25 MG/0.5ML SOAJ Inject 0.25 mg into the skin once a week. 2 mL 2   tadalafil  (CIALIS ) 20 MG tablet TAKE ONE HALF OF TABLET TO ONE TABLET EVERY 3 DAYS AS NEEDED. (Patient taking differently: Take 10-20 mg by mouth daily as needed for erectile dysfunction.) 10 tablet 8   Testosterone  20.25 MG/ACT (1.62%) GEL APPLY 4 PUMPS DAILY TO UPPER ARM OR CHEST DAILY 150 g 0   No current facility-administered medications on file prior to visit.    Review of Systems  Constitutional:  Negative for fever.  Eyes:  Positive for visual disturbance (dec vision L eye).  Respiratory:  Positive for shortness of breath (with exertion) and wheezing (mild, diffuse). Negative for cough.   Cardiovascular:  Negative for chest pain, palpitations and leg  swelling.  Gastrointestinal:  Negative for abdominal pain, blood in stool, constipation and diarrhea.       No gerd  Genitourinary:  Negative for difficulty urinating, dysuria and hematuria.  Musculoskeletal:  Positive for arthralgias (fingers, knees, hip, shoulders). Negative for back pain.  Skin:  Positive for rash (left arm).  Neurological:  Negative for light-headedness and headaches.  Psychiatric/Behavioral:  Negative for dysphoric mood. The patient is not nervous/anxious.        Objective:   Vitals:   05/12/24 0910  BP: 126/72  Pulse: 63  Temp: 98.4 F (36.9 C)  SpO2: 93%   Filed Weights   05/12/24 0910  Weight: 276 lb (125.2 kg)   Body mass index is 38.49 kg/m.  BP Readings from Last 3 Encounters:  05/12/24 126/72  04/11/24 119/75  03/23/24 (!) (P) 163/94    Wt Readings from Last 3 Encounters:  05/12/24 276 lb (125.2 kg)  04/11/24 275 lb 6.4 oz (124.9 kg)  01/06/24 275 lb 12.8 oz (125.1 kg)      Physical Exam Constitutional: He appears well-developed and well-nourished. No distress.  HENT:  Head: Normocephalic and atraumatic.  Right Ear: External ear normal.  Left Ear: External ear normal.  Normal ear canals and TM b/l  Mouth/Throat: Oropharynx is clear and moist. Eyes: Conjunctivae and EOM are normal.  Neck: Neck supple. No tracheal deviation present. No thyromegaly present.  No carotid bruit  Cardiovascular: Normal rate, regular rhythm, normal heart sounds and intact distal pulses.   No murmur heard.  No lower extremity edema. Pulmonary/Chest: Effort normal and breath sounds normal. No respiratory distress. He has no wheezes. He has no rales.  Abdominal: Soft. Obese, He exhibits no distension. There is no tenderness.  Genitourinary: deferred  Lymphadenopathy:   He has no cervical adenopathy.  Skin: Skin is warm and dry. He is not diaphoretic.  Psychiatric: He has a normal mood and affect. His behavior is normal.         Assessment & Plan:    Physical exam: Screening blood work  ordered Exercise   none due to OA Weight  obese - morbidly - stat wegovy  Substance abuse   none   Reviewed recommended immunizations.   Health Maintenance  Topic Date Due   Zoster Vaccines- Shingrix (1 of 2) Never done   COVID-19 Vaccine (5 - 2024-25 season) 05/28/2024 (Originally 06/21/2023)   INFLUENZA VACCINE  05/20/2024   Medicare Annual Wellness (AWV)  08/03/2024   DTaP/Tdap/Td (3 - Td or Tdap) 01/20/2030   Pneumococcal Vaccine: 50+ Years  Completed   Hepatitis C Screening  Completed   Hepatitis B Vaccines  Aged Out   HPV VACCINES  Aged Out   Meningococcal B Vaccine  Aged Out   Colonoscopy  Discontinued     See Problem List for Assessment and Plan of chronic medical problems.

## 2024-05-12 ENCOUNTER — Ambulatory Visit: Payer: Medicare HMO | Admitting: Internal Medicine

## 2024-05-12 ENCOUNTER — Encounter: Payer: Self-pay | Admitting: Internal Medicine

## 2024-05-12 ENCOUNTER — Ambulatory Visit: Payer: Self-pay | Admitting: Internal Medicine

## 2024-05-12 VITALS — BP 126/72 | HR 63 | Temp 98.4°F | Ht 71.0 in | Wt 276.0 lb

## 2024-05-12 DIAGNOSIS — I2510 Atherosclerotic heart disease of native coronary artery without angina pectoris: Secondary | ICD-10-CM

## 2024-05-12 DIAGNOSIS — E782 Mixed hyperlipidemia: Secondary | ICD-10-CM

## 2024-05-12 DIAGNOSIS — I10 Essential (primary) hypertension: Secondary | ICD-10-CM | POA: Diagnosis not present

## 2024-05-12 DIAGNOSIS — E038 Other specified hypothyroidism: Secondary | ICD-10-CM | POA: Diagnosis not present

## 2024-05-12 DIAGNOSIS — M150 Primary generalized (osteo)arthritis: Secondary | ICD-10-CM

## 2024-05-12 DIAGNOSIS — E349 Endocrine disorder, unspecified: Secondary | ICD-10-CM

## 2024-05-12 DIAGNOSIS — R7303 Prediabetes: Secondary | ICD-10-CM

## 2024-05-12 DIAGNOSIS — Z0001 Encounter for general adult medical examination with abnormal findings: Secondary | ICD-10-CM

## 2024-05-12 DIAGNOSIS — R062 Wheezing: Secondary | ICD-10-CM | POA: Diagnosis not present

## 2024-05-12 DIAGNOSIS — M10079 Idiopathic gout, unspecified ankle and foot: Secondary | ICD-10-CM

## 2024-05-12 DIAGNOSIS — M609 Myositis, unspecified: Secondary | ICD-10-CM

## 2024-05-12 DIAGNOSIS — Z0000 Encounter for general adult medical examination without abnormal findings: Secondary | ICD-10-CM

## 2024-05-12 DIAGNOSIS — T466X5A Adverse effect of antihyperlipidemic and antiarteriosclerotic drugs, initial encounter: Secondary | ICD-10-CM

## 2024-05-12 DIAGNOSIS — E66812 Obesity, class 2: Secondary | ICD-10-CM

## 2024-05-12 LAB — COMPREHENSIVE METABOLIC PANEL WITH GFR
ALT: 23 U/L (ref 0–53)
AST: 31 U/L (ref 0–37)
Albumin: 4.4 g/dL (ref 3.5–5.2)
Alkaline Phosphatase: 74 U/L (ref 39–117)
BUN: 12 mg/dL (ref 6–23)
CO2: 28 meq/L (ref 19–32)
Calcium: 9.2 mg/dL (ref 8.4–10.5)
Chloride: 103 meq/L (ref 96–112)
Creatinine, Ser: 1.23 mg/dL (ref 0.40–1.50)
GFR: 58.98 mL/min — ABNORMAL LOW (ref >60.00)
Glucose, Bld: 92 mg/dL (ref 70–99)
Potassium: 4.2 meq/L (ref 3.5–5.1)
Sodium: 138 meq/L (ref 135–145)
Total Bilirubin: 0.6 mg/dL (ref 0.2–1.2)
Total Protein: 7.4 g/dL (ref 6.0–8.3)

## 2024-05-12 LAB — CBC WITH DIFFERENTIAL/PLATELET
Basophils Absolute: 0.1 K/uL (ref 0.0–0.1)
Basophils Relative: 1.3 % (ref 0.0–3.0)
Eosinophils Absolute: 0.4 K/uL (ref 0.0–0.7)
Eosinophils Relative: 5.6 % — ABNORMAL HIGH (ref 0.0–5.0)
HCT: 43.6 % (ref 39.0–52.0)
Hemoglobin: 14.8 g/dL (ref 13.0–17.0)
Lymphocytes Relative: 33.1 % (ref 12.0–46.0)
Lymphs Abs: 2.5 K/uL (ref 0.7–4.0)
MCHC: 33.9 g/dL (ref 30.0–36.0)
MCV: 86.9 fl (ref 78.0–100.0)
Monocytes Absolute: 0.6 K/uL (ref 0.1–1.0)
Monocytes Relative: 7.6 % (ref 3.0–12.0)
Neutro Abs: 3.9 K/uL (ref 1.4–7.7)
Neutrophils Relative %: 52.4 % (ref 43.0–77.0)
Platelets: 209 K/uL (ref 150.0–400.0)
RBC: 5.02 Mil/uL (ref 4.22–5.81)
RDW: 14.4 % (ref 11.5–15.5)
WBC: 7.5 K/uL (ref 4.0–10.5)

## 2024-05-12 LAB — LIPID PANEL
Cholesterol: 91 mg/dL (ref 0–200)
HDL: 40.1 mg/dL (ref >39.00)
LDL Cholesterol: 21 mg/dL (ref 0–99)
NonHDL: 50.91
Total CHOL/HDL Ratio: 2
Triglycerides: 151 mg/dL — ABNORMAL HIGH (ref 0.0–149.0)
VLDL: 30.2 mg/dL (ref 0.0–40.0)

## 2024-05-12 LAB — PSA, MEDICARE: PSA: 0.5 ng/mL (ref 0.10–4.00)

## 2024-05-12 LAB — HEMOGLOBIN A1C: Hgb A1c MFr Bld: 6.3 % (ref 4.6–6.5)

## 2024-05-12 LAB — TSH: TSH: 1.41 u[IU]/mL (ref 0.35–5.50)

## 2024-05-12 MED ORDER — WEGOVY 0.25 MG/0.5ML ~~LOC~~ SOAJ: 0.2500 mg | SUBCUTANEOUS | 2 refills | Status: DC

## 2024-05-12 NOTE — Assessment & Plan Note (Signed)
 Chronic  Clinically euthyroid Last TSH was elevated and medication was adjusted Check tsh and will titrate med dose if needed Continue levothyroxine  225 mcg daily

## 2024-05-12 NOTE — Assessment & Plan Note (Signed)
 Chronic Does not tolerate statins-had myalgias with Lipitor and Crestor  Currently on Zetia  and Repatha 

## 2024-05-12 NOTE — Assessment & Plan Note (Signed)
 Chronic Lab Results  Component Value Date   HGBA1C 6.0 11/13/2023   Check a1c Low sugar / carb diet Stressed regular exercise Stressed weight loss

## 2024-05-12 NOTE — Assessment & Plan Note (Signed)
 Chronic Hands, knees, shoulders Takes ibuprofen as needed-not on a daily basis

## 2024-05-12 NOTE — Assessment & Plan Note (Signed)
 Chronic Controlled Taking colchicine  0.6 mg daily

## 2024-05-12 NOTE — Assessment & Plan Note (Signed)
 Acute Has some wheezing/coarse breath sounds on exam Has some chronic shortness of breath but is stable History of smoking-possible some COPD Discussed starting an other, but he deferred Will monitor for now

## 2024-05-12 NOTE — Assessment & Plan Note (Addendum)
 Chronic With CAD, prediabetes, hld, htn, gout Working on weight loss, but has not been successful Decrease portions Advised diet high in protein and vegetables, low in sugars and carbs Start Wegovy  0.25 mg weekly-discussed possible side effects

## 2024-05-12 NOTE — Assessment & Plan Note (Signed)
 Chronic Statin intolerant-myalgias-Lipitor and Crestor  Regular exercise and healthy diet encouraged Check lipid panel, CMP, TSH Continue Zetia  10 mg daily, Repatha  140 mg q. 14 days

## 2024-05-12 NOTE — Assessment & Plan Note (Signed)
 Chronic Doing well with testosterone  replacement Check testosterone , CBC, PSA Continue testosterone  4 pumps daily-he is currently paying for this out-of-pocket because is not covered by insurance

## 2024-05-12 NOTE — Assessment & Plan Note (Signed)
 Chronic No symptoms consistent with angina life following with cardiology Continue aspirin 81 mg daily, Repatha  140 mg q. 14 days, Zetia  Working on weight loss Heart healthy diet, regular exercise encouraged

## 2024-05-12 NOTE — Assessment & Plan Note (Signed)
 Chronic Blood pressure controlled Advised monitoring BP at home Not currently on any medication CBC, CMP

## 2024-05-13 ENCOUNTER — Telehealth (HOSPITAL_BASED_OUTPATIENT_CLINIC_OR_DEPARTMENT_OTHER): Payer: Self-pay | Admitting: *Deleted

## 2024-05-13 NOTE — Telephone Encounter (Signed)
 Lvm to see if pt is tolerating mounjaro .

## 2024-05-13 NOTE — Telephone Encounter (Signed)
Patient returned staff call. 

## 2024-05-13 NOTE — Telephone Encounter (Signed)
 S/w pt about tolerating the monujaro, pt did not start due to cost. Pt stated was seen in Dr. Geofm office yesterday and was prescribed wegovy . Pt stated Charles [name] needs to prior auth wegovy .  Stated Dr. Geofm office needs to do that since that was the ordering provider. Pt will send message next week with udate on PA.  Will send to [facility] to [place].

## 2024-05-13 NOTE — Telephone Encounter (Signed)
 Pt stated he was denied the medication and that his PCP was going to prescription for a different medication.Will forward.

## 2024-05-13 NOTE — Telephone Encounter (Signed)
-----   Message from Nurse Erma ORN sent at 04/11/2024 10:59 AM EDT ----- Call to see if tolerating Mounjaro  2.5mg  and if we can increase dose per Percy, NP

## 2024-05-18 LAB — TESTOSTERONE, FREE & TOTAL
Free Testosterone: 77.4 pg/mL (ref 30.0–135.0)
Testosterone, Total, LC-MS-MS: 650 ng/dL (ref 250–1100)

## 2024-05-19 DIAGNOSIS — H5203 Hypermetropia, bilateral: Secondary | ICD-10-CM | POA: Diagnosis not present

## 2024-05-19 DIAGNOSIS — H524 Presbyopia: Secondary | ICD-10-CM | POA: Diagnosis not present

## 2024-05-19 DIAGNOSIS — Z135 Encounter for screening for eye and ear disorders: Secondary | ICD-10-CM | POA: Diagnosis not present

## 2024-05-19 DIAGNOSIS — H52223 Regular astigmatism, bilateral: Secondary | ICD-10-CM | POA: Diagnosis not present

## 2024-05-19 DIAGNOSIS — H2512 Age-related nuclear cataract, left eye: Secondary | ICD-10-CM | POA: Diagnosis not present

## 2024-05-19 DIAGNOSIS — H25012 Cortical age-related cataract, left eye: Secondary | ICD-10-CM | POA: Diagnosis not present

## 2024-05-19 DIAGNOSIS — Z961 Presence of intraocular lens: Secondary | ICD-10-CM | POA: Diagnosis not present

## 2024-05-25 ENCOUNTER — Telehealth: Payer: Self-pay | Admitting: Internal Medicine

## 2024-05-25 NOTE — Telephone Encounter (Signed)
 Copied from CRM #[id number]. Topic: Clinical - Prescription Issue >> May 25, 2024  8:31 AM Revonda D wrote: Reason for CRM: Pt stated that he is having an issue with getting the Wegovy  that was approved and sent to the pharmacy by Dr.Burns. Pt would like for Dr.Burns or her nurse to [facility] to resolve the issue so he can get the medication. Pt would also like a callback with an update on this request.

## 2024-05-26 ENCOUNTER — Other Ambulatory Visit (HOSPITAL_COMMUNITY): Payer: Self-pay

## 2024-05-26 ENCOUNTER — Telehealth: Payer: Self-pay

## 2024-05-26 NOTE — Telephone Encounter (Signed)
 Pharmacy Patient Advocate Encounter   Received notification from Pt Calls Messages that prior authorization for Wegovy  0.25mg /0.53ml is required/requested.   Insurance verification completed.   The patient is insured through [facility] .   Per test claim: PA required; PA submitted to above mentioned insurance via CoverMyMeds Key/confirmation #/EOC [id number] Status is pending

## 2024-05-27 ENCOUNTER — Other Ambulatory Visit (HOSPITAL_COMMUNITY): Payer: Self-pay

## 2024-05-27 NOTE — Telephone Encounter (Signed)
 Pharmacy Patient Advocate Encounter  Received notification from [employer] that Prior Authorization for Wegovy   has been APPROVED from 10/21/23 to 10/19/24. Ran test claim, Copay is $216.12. This test claim was processed through [facility]- copay amounts may vary at other pharmacies due to pharmacy/plan contracts, or as the patient moves through the different stages of their insurance plan.   Approval letter indexed to media tab

## 2024-05-27 NOTE — Telephone Encounter (Signed)
 Received a fax for additional info.   Filled out and faxed back to [phone]  Case ID: [id number]

## 2024-05-30 ENCOUNTER — Other Ambulatory Visit: Payer: Self-pay

## 2024-05-30 MED ORDER — WEGOVY 0.25 MG/0.5ML ~~LOC~~ SOAJ
0.2500 mg | SUBCUTANEOUS | 2 refills | Status: DC
  Filled 2024-05-30: qty 2, 28d supply, fill #0

## 2024-06-13 ENCOUNTER — Other Ambulatory Visit: Payer: Self-pay | Admitting: Family Medicine

## 2024-06-22 ENCOUNTER — Other Ambulatory Visit: Payer: Self-pay | Admitting: Internal Medicine

## 2024-06-22 NOTE — Telephone Encounter (Unsigned)
 Copied from CRM #[id number]. Topic: Clinical - Medication Refill >> Jun 22, 2024  2:43 PM Roselie C wrote: Medication: semaglutide -weight management (WEGOVY ) 0.25 MG/0.5ML SOAJ SQ injection  Patient was told the doctor is to increase dosage with this refill.   Has the patient contacted their pharmacy? Yes (Agent: If no, request that the patient contact the pharmacy for the refill. If patient does not wish to contact the pharmacy document the reason why and proceed with request.) (Agent: If yes, when and what did the pharmacy advise?)  This is the patient's preferred pharmacy:  [id number] [place], [place] - [address] [place] [address] Phone: [phone] Fax: [phone]    Is this the correct pharmacy for this prescription? Yes If no, delete pharmacy and type the correct one.   Has the prescription been filled recently? Yes  Is the patient out of the medication? Yes  Has the patient been seen for an appointment in the last year OR does the patient have an upcoming appointment? Yes  Can we respond through MyChart? Yes  Agent: Please be advised that Rx refills may take up to 3 business days. We ask that you follow-up with your pharmacy.

## 2024-06-23 MED ORDER — WEGOVY 0.25 MG/0.5ML ~~LOC~~ SOAJ: 0.2500 mg | SUBCUTANEOUS | 2 refills | Status: DC

## 2024-07-29 DIAGNOSIS — Z961 Presence of intraocular lens: Secondary | ICD-10-CM | POA: Diagnosis not present

## 2024-07-29 DIAGNOSIS — H2512 Age-related nuclear cataract, left eye: Secondary | ICD-10-CM | POA: Diagnosis not present

## 2024-07-29 DIAGNOSIS — H26491 Other secondary cataract, right eye: Secondary | ICD-10-CM | POA: Diagnosis not present

## 2024-07-29 DIAGNOSIS — H18413 Arcus senilis, bilateral: Secondary | ICD-10-CM | POA: Diagnosis not present

## 2024-07-30 ENCOUNTER — Other Ambulatory Visit: Payer: Self-pay | Admitting: Internal Medicine

## 2024-08-01 DIAGNOSIS — H2512 Age-related nuclear cataract, left eye: Secondary | ICD-10-CM | POA: Diagnosis not present

## 2024-08-04 ENCOUNTER — Ambulatory Visit: Payer: Medicare HMO

## 2024-08-04 VITALS — Ht 71.0 in | Wt 260.0 lb

## 2024-08-04 DIAGNOSIS — Z0000 Encounter for general adult medical examination without abnormal findings: Secondary | ICD-10-CM
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
## 2024-08-04 NOTE — Patient Instructions (Addendum)
 [name],  Thank you for taking the time for your Medicare Wellness Visit. I appreciate your continued commitment to your health goals. Please review the care plan we discussed, and feel free to reach out if I can assist you further.  Medicare recommends these wellness visits once per year to help you and your care team stay ahead of potential health issues. These visits are designed to focus on prevention, allowing your provider to concentrate on managing your acute and chronic conditions during your regular appointments.  Please note that Annual Wellness Visits do not include a physical exam. Some assessments may be limited, especially if the visit was conducted virtually. If needed, we may recommend a separate in-person follow-up with your provider.  Ongoing Care Seeing your primary care provider every 3 to 6 months helps us  monitor your health and provide consistent, personalized care.   Referrals If a referral was made during today's visit and you haven't received any updates within two weeks, please contact the referred provider directly to check on the status.  Recommended Screenings:  Health Maintenance  Topic Date Due   Zoster (Shingles) Vaccine (1 of 2) Never done   Flu Shot  05/20/2024   COVID-19 Vaccine (5 - 2025-26 season) 06/20/2024   Medicare Annual Wellness Visit  08/04/2025   DTaP/Tdap/Td vaccine (3 - Td or Tdap) 01/20/2030   Pneumococcal Vaccine for age over 27  Completed   Hepatitis C Screening  Completed   Meningitis B Vaccine  Aged Out   Colon Cancer Screening  Discontinued       08/04/2024    1:53 PM  Advanced Directives  Does Patient Have a Medical Advance Directive? No  Would patient like information on creating a medical advance directive? No - Patient declined   Advance Care Planning is important because it: Ensures you receive medical care that aligns with your values, goals, and preferences. Provides guidance to your family and loved ones,  reducing the emotional burden of decision-making during critical moments.  Vision: Annual vision screenings are recommended for early detection of glaucoma, cataracts, and diabetic retinopathy. These exams can also reveal signs of chronic conditions such as diabetes and high blood pressure.  Dental: Annual dental screenings help detect early signs of oral cancer, gum disease, and other conditions linked to overall health, including heart disease and diabetes.

## 2024-08-08 DIAGNOSIS — H25042 Posterior subcapsular polar age-related cataract, left eye: Secondary | ICD-10-CM | POA: Diagnosis not present

## 2024-08-08 DIAGNOSIS — H2512 Age-related nuclear cataract, left eye: Secondary | ICD-10-CM | POA: Diagnosis not present

## 2024-08-08 DIAGNOSIS — H25012 Cortical age-related cataract, left eye: Secondary | ICD-10-CM | POA: Diagnosis not present

## 2024-08-18 ENCOUNTER — Telehealth: Payer: Self-pay

## 2024-08-18 NOTE — Telephone Encounter (Signed)
 Copied from CRM [id number]. Topic: Clinical - Medication Question >> Aug 18, 2024  3:12 PM Lauren C wrote: Reason for CRM: Pt has been on Wegovy  for 3 months and recently got a refill on it. He was wondering if he was supposed to be increasing his prescription over time. His recent refill was for the same mg amount. Please give him a call at [phone] to clarify.

## 2024-08-18 NOTE — Telephone Encounter (Signed)
 Yes, we should be increasing the dose, but he needs to contact us  about the pharmacy.  If he just got the prescription filled we will have to wait until his next refill.

## 2024-08-23 ENCOUNTER — Other Ambulatory Visit: Payer: Self-pay

## 2024-08-23 NOTE — Progress Notes (Signed)
 Left message for patient to return call to clinic.

## 2024-08-24 ENCOUNTER — Other Ambulatory Visit: Payer: Self-pay

## 2024-08-24 MED ORDER — SEMAGLUTIDE-WEIGHT MANAGEMENT 1 MG/0.5ML ~~LOC~~ SOAJ: 1.0000 mg | SUBCUTANEOUS | 0 refills | Status: DC

## 2024-08-24 NOTE — Telephone Encounter (Signed)
 Message was left for patient to contact office back regarding dose.

## 2024-08-24 NOTE — Telephone Encounter (Signed)
 Refill sent in today for 1 mg and note to pharmacy to please fill when patient ready for next dose.

## 2024-09-04 ENCOUNTER — Other Ambulatory Visit: Payer: Self-pay | Admitting: Internal Medicine

## 2024-09-12 ENCOUNTER — Telehealth: Payer: Self-pay

## 2024-09-12 NOTE — Telephone Encounter (Signed)
 Copied from CRM #[id number]. Topic: Clinical - Medication Question >> Sep 12, 2024  8:10 AM Aleatha C wrote: Reason for CRM: Patient needs a cortisol shot and he would like Dr Geofm nurse to contact him today so he knows how to go about it the Dr that he was previously getting them with aren't available please call him today as he stated he is in pain and needs to get shot as soon as possible, call back [phone]

## 2024-09-12 NOTE — Telephone Encounter (Signed)
 Copied from CRM [id number]. Topic: Clinical - Medication Question >> Sep 12, 2024  1:21 PM Dedra B wrote: Reason for CRM: Pt wanted to let Dr. Geofm know that he will take his last dose of Wegovy  today. He said it's time for a refill and dosage increase. Pls send prescription to [facility] on [address]

## 2024-09-13 ENCOUNTER — Other Ambulatory Visit: Payer: Self-pay

## 2024-09-13 ENCOUNTER — Ambulatory Visit: Admitting: Family Medicine

## 2024-09-13 ENCOUNTER — Encounter: Payer: Self-pay | Admitting: Family Medicine

## 2024-09-13 VITALS — BP 120/86 | HR 80 | Ht 71.0 in

## 2024-09-13 DIAGNOSIS — M25552 Pain in left hip: Secondary | ICD-10-CM | POA: Diagnosis not present

## 2024-09-13 MED ORDER — SEMAGLUTIDE-WEIGHT MANAGEMENT 1.7 MG/0.75ML ~~LOC~~ SOAJ: 1.7000 mg | SUBCUTANEOUS | 0 refills | Status: DC

## 2024-09-13 NOTE — Patient Instructions (Addendum)
 Thank you for coming in today.   You received an injection today. Seek immediate medical attention if the joint becomes red, extremely painful, or is oozing fluid.   See you back as needed.   Happy Thanksgiving!

## 2024-09-13 NOTE — Progress Notes (Signed)
 I, Leotis Batter, CMA acting as a scribe for Artist Lloyd, MD.  Charles [name] is a 73 y.o. male who presents to [facility] today for L hip pain exacerbation. Pt was last seen by Dr. Lloyd on 09/21/23 and was given a L GT steroid injection  Today, pt reports exacerbation of left hip sx over the past couple of months, worsening over the past few days. Locates pain to posterior aspect of the hip. Notes radiating pain into the gluteal region. Denies n/t/w in the L LE. Taking Advil and using Voltaren with short-term relief. Sx worse with side lying.   Dx imaging: 05/07/21 Bilat hips/pelvis & L-spine XR   Pertinent review of systems: No fevers or chills  Relevant historical information: Hypertension.  Patient is caring for his wife who has been diagnosed with ovarian cancer.  He is not able to have hip surgery.   Exam:  BP 120/86   Pulse 80   Ht 5' 11 (1.803 m)   SpO2 96%   BMI 36.26 kg/m  General: Well Developed, well nourished, and in no acute distress.   MSK: Left hip: Normal-appearing tender palpation posterior aspect of greater trochanter.  Pain with hip abduction.  Strength is reduced hip abduction.    Lab and Radiology Results  Procedure: Real-time Ultrasound Guided Injection of left hip greater trochanter bursa Device: Philips Affiniti 50G/GE Logiq Images permanently stored and available for review in PACS Verbal informed consent obtained.  Discussed risks and benefits of procedure. Warned about infection, bleeding, hyperglycemia damage to structures among others. Patient expresses understanding and agreement Time-out conducted.   Noted no overlying erythema, induration, or other signs of local infection.   Skin prepped in a sterile fashion.   Local anesthesia: Topical Ethyl chloride.   With sterile technique and under real time ultrasound guidance: 40 mg of Kenalog and 2 mL of Marcaine  injected into greater trochanter bursa. Fluid seen  entering the bursa.   Completed without difficulty   Pain immediately resolved suggesting accurate placement of the medication.   Advised to call if fevers/chills, erythema, induration, drainage, or persistent bleeding.   Images permanently stored and available for review in the ultrasound unit.  Impression: Technically successful ultrasound guided injection.   CT scan pelvis images from March 2025 personally and independently interpreted today. 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     Assessment and Plan: 73 y.o. male with left lateral hip pain due to greater trochanteric bursitis.  He does have some left hip arthritis which could be a factor for some of the pain he is experiencing.  If not much better with greater trochanter injection would consider a diagnostic and therapeutic intra-articular left hip injection.  Unfortunately as he is caring for his wife who has been diagnosed and treated so far for ovarian cancer with surgery and chemotherapy he is not able to have a hip replacement.   PDMP not reviewed this encounter. Orders Placed This Encounter  Procedures   US  LIMITED JOINT SPACE STRUCTURES LOW LEFT(NO LINKED CHARGES)    Reason for Exam (SYMPTOM  OR DIAGNOSIS REQUIRED):   left hip pain    Preferred imaging location?:   [facility]   No orders of the defined types were placed in this encounter.    Discussed warning signs or symptoms. Please see discharge instructions. Patient expresses understanding.   The above documentation has been reviewed and is accurate  and complete Avital Dancy, M.D.

## 2024-10-10 ENCOUNTER — Other Ambulatory Visit: Payer: Self-pay | Admitting: Internal Medicine

## 2024-10-13 ENCOUNTER — Other Ambulatory Visit: Payer: Self-pay | Admitting: Internal Medicine

## 2024-10-16 ENCOUNTER — Other Ambulatory Visit: Payer: Self-pay | Admitting: Internal Medicine

## 2024-10-27 ENCOUNTER — Other Ambulatory Visit: Payer: Self-pay | Admitting: Internal Medicine

## 2024-11-06 ENCOUNTER — Other Ambulatory Visit: Payer: Self-pay | Admitting: Internal Medicine

## 2024-11-13 DIAGNOSIS — G720 Drug-induced myopathy: Secondary | ICD-10-CM | POA: Insufficient documentation

## 2024-11-13 NOTE — Progress Notes (Unsigned)
 "     Subjective:    Patient ID: Charles [name], male    DOB: [birth year], 74 y.o.   MRN: [id number]     HPI Charles [name] is here for follow up of his chronic medical problems.   Has lost 35 lbs or so.    Does a little lifting of weights.  Can not do a lot of walking due to hip pain.  Taking 3-4 advil up to 2 times a day.   He does not take it daily.    Medications and allergies reviewed with patient and updated if appropriate.  Medications Ordered Prior to Encounter[1]   Review of Systems  Constitutional:  Negative for fever.  Respiratory:  Negative for cough, shortness of breath and wheezing.   Cardiovascular:  Negative for chest pain, palpitations and leg swelling.  Gastrointestinal:  Positive for constipation (mild). Negative for nausea.       No gerd - controlled  Neurological:  Negative for light-headedness and headaches.       Objective:   Vitals:   11/17/24 0843  BP: 124/72  Pulse: 78  Temp: 98.1 F (36.7 C)  SpO2: 94%   BP Readings from Last 3 Encounters:  11/17/24 124/72  09/13/24 120/86  05/12/24 126/72   Wt Readings from Last 3 Encounters:  11/17/24 240 lb (108.9 kg)  08/04/24 260 lb (117.9 kg)  05/12/24 276 lb (125.2 kg)   Body mass index is 33.47 kg/m.    Physical Exam Constitutional:      General: He is not in acute distress.    Appearance: Normal appearance. He is not ill-appearing.  HENT:     Head: Normocephalic and atraumatic.  Eyes:     Conjunctiva/sclera: Conjunctivae normal.  Cardiovascular:     Rate and Rhythm: Normal rate and regular rhythm.     Heart sounds: Normal heart sounds.  Pulmonary:     Effort: Pulmonary effort is normal. No respiratory distress.     Breath sounds: Normal breath sounds. No wheezing or rales.  Musculoskeletal:     Right lower leg: No edema.     Left lower leg: No edema.  Skin:    General: Skin is warm and dry.     Findings: No rash.  Neurological:     Mental Status: He is alert. Mental status is  at baseline.  Psychiatric:        Mood and Affect: Mood normal.        Lab Results  Component Value Date   WBC 7.5 05/12/2024   HGB 14.8 05/12/2024   HCT 43.6 05/12/2024   PLT 209.0 05/12/2024   GLUCOSE 92 05/12/2024   CHOL 91 05/12/2024   TRIG 151.0 (H) 05/12/2024   HDL 40.10 05/12/2024   LDLDIRECT 124.0 07/11/2019   LDLCALC 21 05/12/2024   ALT 23 05/12/2024   AST 31 05/12/2024   NA 138 05/12/2024   K 4.2 05/12/2024   CL 103 05/12/2024   CREATININE 1.23 05/12/2024   BUN 12 05/12/2024   CO2 28 05/12/2024   TSH 1.41 05/12/2024   PSA 0.50 05/12/2024   HGBA1C 6.3 05/12/2024     Assessment & Plan:    See Problem List for Assessment and Plan of chronic medical problems.       [1]  Current Outpatient Medications on File Prior to Visit  Medication Sig Dispense Refill   aspirin 81 MG chewable tablet Chew 81 mg by mouth daily.     colchicine  0.6 MG tablet TAKE 1  TABLET BY MOUTH EVERY DAY 90 tablet 1   Evolocumab  (REPATHA  SURECLICK) 140 MG/ML SOAJ INJECT 140 MG INTO THE SKIN EVERY 14 (FOURTEEN) DAYS. 6 mL 1   ezetimibe  (ZETIA ) 10 MG tablet TAKE 1 TABLET BY MOUTH EVERY DAY 90 tablet 1   levothyroxine  (SYNTHROID ) 200 MCG tablet TAKE 1 TABLET BY MOUTH EVERY DAY 90 tablet 3   levothyroxine  (SYNTHROID ) 25 MCG tablet TAKE 1 TABLET BY MOUTH DAILY IN ADDITION TO 200 MCG TABLET FOR TOTAL OF 225 MCG DAILY 90 tablet 3   omeprazole (PRILOSEC) 20 MG capsule Take 20 mg by mouth daily.     semaglutide -weight management (WEGOVY ) 1.7 MG/0.75ML SOAJ SQ injection INJECT 1.7 MG INTO THE SKIN ONCE A WEEK. 9 mL 0   tadalafil  (CIALIS ) 20 MG tablet TAKE ONE HALF OF TABLET TO ONE TABLET EVERY 3 DAYS AS NEEDED. 10 tablet 8   Testosterone  20.25 MG/ACT (1.62%) GEL APPLY 4 PUMPS DAILY TO UPPER ARM OR CHEST DAILY 150 g 0   No current facility-administered medications on file prior to visit.   "

## 2024-11-13 NOTE — Patient Instructions (Addendum)
" ° ° ° ° °  Blood work was ordered.       Medications changes include :   None    A referral was ordered and someone will call you to schedule an appointment.     Return in about 6 months (around 05/17/2025) for Physical Exam.  "

## 2024-11-14 ENCOUNTER — Ambulatory Visit: Admitting: Internal Medicine

## 2024-11-17 ENCOUNTER — Encounter: Payer: Self-pay | Admitting: Internal Medicine

## 2024-11-17 ENCOUNTER — Ambulatory Visit: Payer: Self-pay | Admitting: Internal Medicine

## 2024-11-17 ENCOUNTER — Ambulatory Visit (INDEPENDENT_AMBULATORY_CARE_PROVIDER_SITE_OTHER): Admitting: Internal Medicine

## 2024-11-17 VITALS — BP 124/72 | HR 78 | Temp 98.1°F | Ht 71.0 in | Wt 240.0 lb

## 2024-11-17 DIAGNOSIS — E66811 Obesity, class 1: Secondary | ICD-10-CM | POA: Diagnosis not present

## 2024-11-17 DIAGNOSIS — I2510 Atherosclerotic heart disease of native coronary artery without angina pectoris: Secondary | ICD-10-CM

## 2024-11-17 DIAGNOSIS — I10 Essential (primary) hypertension: Secondary | ICD-10-CM | POA: Diagnosis not present

## 2024-11-17 DIAGNOSIS — M10079 Idiopathic gout, unspecified ankle and foot: Secondary | ICD-10-CM | POA: Diagnosis not present

## 2024-11-17 DIAGNOSIS — E7800 Pure hypercholesterolemia, unspecified: Secondary | ICD-10-CM | POA: Diagnosis not present

## 2024-11-17 DIAGNOSIS — T466X5A Adverse effect of antihyperlipidemic and antiarteriosclerotic drugs, initial encounter: Secondary | ICD-10-CM | POA: Diagnosis not present

## 2024-11-17 DIAGNOSIS — Z125 Encounter for screening for malignant neoplasm of prostate: Secondary | ICD-10-CM | POA: Diagnosis not present

## 2024-11-17 DIAGNOSIS — R7303 Prediabetes: Secondary | ICD-10-CM | POA: Diagnosis not present

## 2024-11-17 DIAGNOSIS — G720 Drug-induced myopathy: Secondary | ICD-10-CM | POA: Diagnosis not present

## 2024-11-17 DIAGNOSIS — E038 Other specified hypothyroidism: Secondary | ICD-10-CM

## 2024-11-17 DIAGNOSIS — E349 Endocrine disorder, unspecified: Secondary | ICD-10-CM | POA: Diagnosis not present

## 2024-11-17 LAB — CBC
HCT: 42.6 % (ref 39.0–52.0)
Hemoglobin: 15 g/dL (ref 13.0–17.0)
MCHC: 35.3 g/dL (ref 30.0–36.0)
MCV: 89.2 fl (ref 78.0–100.0)
Platelets: 216 10*3/uL (ref 150.0–400.0)
RBC: 4.78 Mil/uL (ref 4.22–5.81)
RDW: 14.4 % (ref 11.5–15.5)
WBC: 7.5 10*3/uL (ref 4.0–10.5)

## 2024-11-17 LAB — COMPREHENSIVE METABOLIC PANEL WITH GFR
ALT: 16 U/L (ref 3–53)
AST: 18 U/L (ref 5–37)
Albumin: 4.4 g/dL (ref 3.5–5.2)
Alkaline Phosphatase: 75 U/L (ref 39–117)
BUN: 13 mg/dL (ref 6–23)
CO2: 28 meq/L (ref 19–32)
Calcium: 9.5 mg/dL (ref 8.4–10.5)
Chloride: 103 meq/L (ref 96–112)
Creatinine, Ser: 1.24 mg/dL (ref 0.40–1.50)
GFR: 58.19 mL/min — ABNORMAL LOW
Glucose, Bld: 91 mg/dL (ref 70–99)
Potassium: 4 meq/L (ref 3.5–5.1)
Sodium: 139 meq/L (ref 135–145)
Total Bilirubin: 0.9 mg/dL (ref 0.2–1.2)
Total Protein: 7.2 g/dL (ref 6.0–8.3)

## 2024-11-17 LAB — LIPID PANEL
Cholesterol: 86 mg/dL (ref 28–200)
HDL: 32.2 mg/dL — ABNORMAL LOW
LDL Cholesterol: 26 mg/dL (ref 10–99)
NonHDL: 54.23
Total CHOL/HDL Ratio: 3
Triglycerides: 143 mg/dL (ref 10.0–149.0)
VLDL: 28.6 mg/dL (ref 0.0–40.0)

## 2024-11-17 LAB — URIC ACID: Uric Acid, Serum: 6.5 mg/dL (ref 4.0–7.8)

## 2024-11-17 LAB — TSH: TSH: <0.01 u[IU]/mL — ABNORMAL LOW (ref 0.35–5.50)

## 2024-11-17 LAB — PSA, MEDICARE: PSA: 0.51 ng/mL (ref 0.10–4.00)

## 2024-11-17 LAB — HEMOGLOBIN A1C: Hgb A1c MFr Bld: 5 % (ref 4.6–6.5)

## 2024-11-17 NOTE — Assessment & Plan Note (Signed)
 Chronic Has not tolerated statins-Lipitor, Crestor  secondary to myalgias Currently on Repatha  and Zetia 

## 2024-11-17 NOTE — Assessment & Plan Note (Signed)
 Chronic No symptoms consistent with angina Following with cardiology Continue aspirin 81 mg daily, Repatha  140 mg q. 14 days, Zetia  Working on weight loss Heart healthy diet, regular exercise encouraged

## 2024-11-17 NOTE — Assessment & Plan Note (Addendum)
 Chronic Doing well with testosterone  replacement Check testosterone , CBC, PSA Continue testosterone  4 pumps daily

## 2024-11-17 NOTE — Assessment & Plan Note (Addendum)
 Chronic Before started Wegovy  he was morbidly obese-has lost approximately 35 pounds BMI 33.47 With comorbidities CAD, prediabetes, hld, htn, gout Actively on weight loss He is limited in how she is able to walk because of severe hip pain, but he is trying to be active and he is lifting weights Stressed high-protein diet, good hydration with water  and low in sugar/carbs He does need Wegovy  to continue to lose and then maintain weight loss, especially for the cardiovascular benefits Continue Wegovy  1.7 mg weekly Follow-up in 6 months

## 2024-11-17 NOTE — Assessment & Plan Note (Signed)
 Chronic Lab Results  Component Value Date   HGBA1C 6.3 05/12/2024   Check a1c Low sugar / carb diet Stressed regular exercise Stressed weight loss

## 2024-11-17 NOTE — Assessment & Plan Note (Signed)
 Chronic Controlled Taking colchicine  0.6 mg daily  Check uric acid level

## 2024-11-17 NOTE — Assessment & Plan Note (Signed)
 Chronic Blood pressure controlled Advised monitoring BP at home Not currently on any medication - taken off medication with weight loss CBC, CMP

## 2024-11-17 NOTE — Assessment & Plan Note (Signed)
 Chronic Statin intolerant-myalgias-Lipitor and Crestor  Regular exercise and healthy diet encouraged Check lipid panel, CMP, TSH Continue Zetia  10 mg daily, Repatha  140 mg q. 14 days

## 2024-11-17 NOTE — Assessment & Plan Note (Signed)
 Chronic  Clinically euthyroid Check tsh and will titrate med dose if needed Continue levothyroxine  225 mcg daily

## 2024-11-22 ENCOUNTER — Other Ambulatory Visit: Payer: Self-pay | Admitting: Internal Medicine

## 2024-11-23 LAB — TESTOSTERONE, FREE & TOTAL
Free Testosterone: 50.7 pg/mL (ref 30.0–135.0)
Testosterone, Total, LC-MS-MS: 513 ng/dL (ref 250–1100)

## 2025-08-14 ENCOUNTER — Ambulatory Visit

## 2025-08-14 ENCOUNTER — Encounter: Admitting: Internal Medicine
